# Patient Record
Sex: Female | Born: 1965 | Hispanic: No | Marital: Single | Smoking: Never smoker
Health system: Southern US, Community
[De-identification: ages and names within clinical notes are randomized; demographics above are authoritative.]

## PROBLEM LIST (undated history)

## (undated) DIAGNOSIS — I5032 Chronic diastolic (congestive) heart failure: Secondary | ICD-10-CM

## (undated) DIAGNOSIS — C799 Secondary malignant neoplasm of unspecified site: Secondary | ICD-10-CM

## (undated) DIAGNOSIS — C569 Malignant neoplasm of unspecified ovary: Secondary | ICD-10-CM

## (undated) DIAGNOSIS — I77810 Thoracic aortic ectasia: Secondary | ICD-10-CM

## (undated) DIAGNOSIS — C801 Malignant (primary) neoplasm, unspecified: Secondary | ICD-10-CM

## (undated) DIAGNOSIS — I4891 Unspecified atrial fibrillation: Secondary | ICD-10-CM

## (undated) DIAGNOSIS — E039 Hypothyroidism, unspecified: Secondary | ICD-10-CM

## (undated) DIAGNOSIS — J45909 Unspecified asthma, uncomplicated: Secondary | ICD-10-CM

## (undated) DIAGNOSIS — I4892 Unspecified atrial flutter: Secondary | ICD-10-CM

## (undated) DIAGNOSIS — I10 Essential (primary) hypertension: Secondary | ICD-10-CM

## (undated) HISTORY — PX: BIOPSY THYROID: PRO38

## (undated) HISTORY — PX: LUNG BIOPSY: SHX232

## (undated) HISTORY — DX: Hypothyroidism, unspecified: E03.9

## (undated) HISTORY — PX: PORTA CATH INSERTION: CATH118285

## (undated) HISTORY — DX: Chronic diastolic (congestive) heart failure: I50.32

## (undated) HISTORY — DX: Thoracic aortic ectasia: I77.810

## (undated) HISTORY — DX: Unspecified atrial flutter: I48.92

---
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
## 2018-12-30 DIAGNOSIS — J069 Acute upper respiratory infection, unspecified: Secondary | ICD-10-CM | POA: Diagnosis not present

## 2019-01-31 DIAGNOSIS — Z760 Encounter for issue of repeat prescription: Secondary | ICD-10-CM | POA: Diagnosis not present

## 2019-01-31 DIAGNOSIS — Z23 Encounter for immunization: Secondary | ICD-10-CM | POA: Diagnosis not present

## 2019-01-31 DIAGNOSIS — I10 Essential (primary) hypertension: Secondary | ICD-10-CM | POA: Diagnosis not present

## 2019-01-31 DIAGNOSIS — Z1212 Encounter for screening for malignant neoplasm of rectum: Secondary | ICD-10-CM | POA: Diagnosis not present

## 2019-01-31 DIAGNOSIS — J4530 Mild persistent asthma, uncomplicated: Secondary | ICD-10-CM | POA: Diagnosis not present

## 2019-01-31 DIAGNOSIS — Z1211 Encounter for screening for malignant neoplasm of colon: Secondary | ICD-10-CM | POA: Diagnosis not present

## 2019-03-31 DIAGNOSIS — M47816 Spondylosis without myelopathy or radiculopathy, lumbar region: Secondary | ICD-10-CM | POA: Diagnosis not present

## 2019-03-31 DIAGNOSIS — M4180 Other forms of scoliosis, site unspecified: Secondary | ICD-10-CM | POA: Diagnosis not present

## 2019-03-31 DIAGNOSIS — M545 Low back pain: Secondary | ICD-10-CM | POA: Diagnosis not present

## 2019-04-14 DIAGNOSIS — M545 Low back pain: Secondary | ICD-10-CM | POA: Diagnosis not present

## 2019-04-14 DIAGNOSIS — M47816 Spondylosis without myelopathy or radiculopathy, lumbar region: Secondary | ICD-10-CM | POA: Diagnosis not present

## 2019-04-14 DIAGNOSIS — M4180 Other forms of scoliosis, site unspecified: Secondary | ICD-10-CM | POA: Diagnosis not present

## 2019-04-16 DIAGNOSIS — M6281 Muscle weakness (generalized): Secondary | ICD-10-CM | POA: Diagnosis not present

## 2019-04-16 DIAGNOSIS — M545 Low back pain: Secondary | ICD-10-CM | POA: Diagnosis not present

## 2019-04-18 DIAGNOSIS — M545 Low back pain: Secondary | ICD-10-CM | POA: Diagnosis not present

## 2019-04-18 DIAGNOSIS — M6281 Muscle weakness (generalized): Secondary | ICD-10-CM | POA: Diagnosis not present

## 2019-04-22 DIAGNOSIS — M545 Low back pain: Secondary | ICD-10-CM | POA: Diagnosis not present

## 2019-04-22 DIAGNOSIS — M6281 Muscle weakness (generalized): Secondary | ICD-10-CM | POA: Diagnosis not present

## 2019-04-23 DIAGNOSIS — M545 Low back pain: Secondary | ICD-10-CM | POA: Diagnosis not present

## 2019-04-23 DIAGNOSIS — M6281 Muscle weakness (generalized): Secondary | ICD-10-CM | POA: Diagnosis not present

## 2019-04-25 DIAGNOSIS — M545 Low back pain: Secondary | ICD-10-CM | POA: Diagnosis not present

## 2019-04-25 DIAGNOSIS — M6281 Muscle weakness (generalized): Secondary | ICD-10-CM | POA: Diagnosis not present

## 2019-04-29 DIAGNOSIS — M6281 Muscle weakness (generalized): Secondary | ICD-10-CM | POA: Diagnosis not present

## 2019-04-29 DIAGNOSIS — M545 Low back pain: Secondary | ICD-10-CM | POA: Diagnosis not present

## 2019-05-01 DIAGNOSIS — M545 Low back pain: Secondary | ICD-10-CM | POA: Diagnosis not present

## 2019-05-01 DIAGNOSIS — M6281 Muscle weakness (generalized): Secondary | ICD-10-CM | POA: Diagnosis not present

## 2019-05-02 DIAGNOSIS — M545 Low back pain: Secondary | ICD-10-CM | POA: Diagnosis not present

## 2019-05-02 DIAGNOSIS — M6281 Muscle weakness (generalized): Secondary | ICD-10-CM | POA: Diagnosis not present

## 2019-05-06 DIAGNOSIS — M545 Low back pain: Secondary | ICD-10-CM | POA: Diagnosis not present

## 2019-05-06 DIAGNOSIS — M6281 Muscle weakness (generalized): Secondary | ICD-10-CM | POA: Diagnosis not present

## 2019-05-08 DIAGNOSIS — M6281 Muscle weakness (generalized): Secondary | ICD-10-CM | POA: Diagnosis not present

## 2019-05-08 DIAGNOSIS — M545 Low back pain: Secondary | ICD-10-CM | POA: Diagnosis not present

## 2019-05-13 DIAGNOSIS — M545 Low back pain: Secondary | ICD-10-CM | POA: Diagnosis not present

## 2019-05-13 DIAGNOSIS — M6281 Muscle weakness (generalized): Secondary | ICD-10-CM | POA: Diagnosis not present

## 2019-05-14 DIAGNOSIS — Z1159 Encounter for screening for other viral diseases: Secondary | ICD-10-CM | POA: Diagnosis not present

## 2019-05-23 DIAGNOSIS — M545 Low back pain: Secondary | ICD-10-CM | POA: Diagnosis not present

## 2019-05-23 DIAGNOSIS — M6281 Muscle weakness (generalized): Secondary | ICD-10-CM | POA: Diagnosis not present

## 2019-05-26 DIAGNOSIS — Z1159 Encounter for screening for other viral diseases: Secondary | ICD-10-CM | POA: Diagnosis not present

## 2019-05-26 DIAGNOSIS — R05 Cough: Secondary | ICD-10-CM | POA: Diagnosis not present

## 2019-06-01 ENCOUNTER — Other Ambulatory Visit: Payer: Self-pay

## 2019-06-01 DIAGNOSIS — C414 Malignant neoplasm of pelvic bones, sacrum and coccyx: Secondary | ICD-10-CM | POA: Diagnosis not present

## 2019-06-01 DIAGNOSIS — I10 Essential (primary) hypertension: Secondary | ICD-10-CM | POA: Insufficient documentation

## 2019-06-01 DIAGNOSIS — R1030 Lower abdominal pain, unspecified: Secondary | ICD-10-CM | POA: Diagnosis not present

## 2019-06-01 DIAGNOSIS — D649 Anemia, unspecified: Secondary | ICD-10-CM | POA: Insufficient documentation

## 2019-06-01 DIAGNOSIS — J45909 Unspecified asthma, uncomplicated: Secondary | ICD-10-CM | POA: Diagnosis not present

## 2019-06-01 DIAGNOSIS — Z79899 Other long term (current) drug therapy: Secondary | ICD-10-CM | POA: Diagnosis not present

## 2019-06-01 DIAGNOSIS — C495 Malignant neoplasm of connective and soft tissue of pelvis: Secondary | ICD-10-CM | POA: Diagnosis not present

## 2019-06-01 DIAGNOSIS — R109 Unspecified abdominal pain: Secondary | ICD-10-CM | POA: Diagnosis not present

## 2019-06-02 ENCOUNTER — Encounter (HOSPITAL_COMMUNITY): Payer: Self-pay

## 2019-06-02 ENCOUNTER — Emergency Department (HOSPITAL_COMMUNITY): Payer: BC Managed Care – PPO

## 2019-06-02 ENCOUNTER — Emergency Department (HOSPITAL_COMMUNITY)
Admission: EM | Admit: 2019-06-02 | Discharge: 2019-06-02 | Disposition: A | Discharge status: Home / Self Care | Payer: BC Managed Care – PPO | Attending: Emergency Medicine | Admitting: Emergency Medicine

## 2019-06-02 DIAGNOSIS — R1030 Lower abdominal pain, unspecified: Secondary | ICD-10-CM

## 2019-06-02 DIAGNOSIS — C763 Malignant neoplasm of pelvis: Secondary | ICD-10-CM

## 2019-06-02 DIAGNOSIS — R109 Unspecified abdominal pain: Secondary | ICD-10-CM | POA: Diagnosis not present

## 2019-06-02 DIAGNOSIS — D649 Anemia, unspecified: Secondary | ICD-10-CM

## 2019-06-02 HISTORY — DX: Unspecified asthma, uncomplicated: J45.909

## 2019-06-02 HISTORY — DX: Essential (primary) hypertension: I10

## 2019-06-02 LAB — CBC WITH DIFFERENTIAL/PLATELET
Abs Immature Granulocytes: 0.04 10*3/uL (ref 0.00–0.07)
Basophils Absolute: 0.1 10*3/uL (ref 0.0–0.1)
Basophils Relative: 1 %
Eosinophils Absolute: 0.2 10*3/uL (ref 0.0–0.5)
Eosinophils Relative: 1 %
HCT: 29.6 % — ABNORMAL LOW (ref 36.0–46.0)
Hemoglobin: 8.4 g/dL — ABNORMAL LOW (ref 12.0–15.0)
Immature Granulocytes: 0 %
Lymphocytes Relative: 20 %
Lymphs Abs: 2.5 10*3/uL (ref 0.7–4.0)
MCH: 21 pg — ABNORMAL LOW (ref 26.0–34.0)
MCHC: 28.4 g/dL — ABNORMAL LOW (ref 30.0–36.0)
MCV: 74 fL — ABNORMAL LOW (ref 80.0–100.0)
Monocytes Absolute: 1.5 10*3/uL — ABNORMAL HIGH (ref 0.1–1.0)
Monocytes Relative: 12 %
Neutro Abs: 8.2 10*3/uL — ABNORMAL HIGH (ref 1.7–7.7)
Neutrophils Relative %: 66 %
Platelets: 420 10*3/uL — ABNORMAL HIGH (ref 150–400)
RBC: 4 MIL/uL (ref 3.87–5.11)
RDW: 14.8 % (ref 11.5–15.5)
WBC: 12.4 10*3/uL — ABNORMAL HIGH (ref 4.0–10.5)
nRBC: 0 % (ref 0.0–0.2)

## 2019-06-02 LAB — URINALYSIS, ROUTINE W REFLEX MICROSCOPIC
Bacteria, UA: NONE SEEN
Bilirubin Urine: NEGATIVE
Glucose, UA: NEGATIVE mg/dL
Ketones, ur: NEGATIVE mg/dL
Leukocytes,Ua: NEGATIVE
Nitrite: NEGATIVE
Protein, ur: NEGATIVE mg/dL
Specific Gravity, Urine: 1.011 (ref 1.005–1.030)
pH: 5 (ref 5.0–8.0)

## 2019-06-02 LAB — COMPREHENSIVE METABOLIC PANEL
ALT: 13 U/L (ref 0–44)
AST: 19 U/L (ref 15–41)
Albumin: 4.2 g/dL (ref 3.5–5.0)
Alkaline Phosphatase: 40 U/L (ref 38–126)
Anion gap: 9 (ref 5–15)
BUN: 11 mg/dL (ref 6–20)
CO2: 24 mmol/L (ref 22–32)
Calcium: 9.2 mg/dL (ref 8.9–10.3)
Chloride: 103 mmol/L (ref 98–111)
Creatinine, Ser: 0.92 mg/dL (ref 0.44–1.00)
GFR calc Af Amer: >60 mL/min (ref >60)
GFR calc non Af Amer: >60 mL/min (ref >60)
Glucose, Bld: 99 mg/dL (ref 70–99)
Potassium: 4.1 mmol/L (ref 3.5–5.1)
Sodium: 136 mmol/L (ref 135–145)
Total Bilirubin: 0.6 mg/dL (ref 0.3–1.2)
Total Protein: 7.4 g/dL (ref 6.5–8.1)

## 2019-06-02 LAB — LIPASE, BLOOD: Lipase: 30 U/L (ref 11–51)

## 2019-06-02 LAB — PREGNANCY, URINE: Preg Test, Ur: NEGATIVE

## 2019-06-02 MED ORDER — IOHEXOL 300 MG/ML  SOLN
100.0000 mL | Freq: Once | INTRAMUSCULAR | Status: AC | PRN
  Administered 2019-06-02: 100 mL via INTRAVENOUS

## 2019-06-02 MED ORDER — SODIUM CHLORIDE 0.9 % IV BOLUS
1000.0000 mL | Freq: Once | INTRAVENOUS | Status: AC
  Administered 2019-06-02: 1000 mL via INTRAVENOUS

## 2019-06-02 MED ORDER — SODIUM CHLORIDE (PF) 0.9 % IJ SOLN
INTRAMUSCULAR | Status: AC
  Filled 2019-06-02: qty 50

## 2019-06-02 MED ORDER — ONDANSETRON 4 MG PO TBDP: 4.0000 mg | ORAL_TABLET | Freq: Three times a day (TID) | ORAL | 0 refills | Status: DC | PRN

## 2019-06-02 NOTE — ED Notes (Addendum)
Pt verbalized discharge instructions and follow up care. Alert and ambulatory. No iv. Pt given disc from CT

## 2019-06-02 NOTE — Discharge Instructions (Signed)
Your work-up today was notable for unfortunate findings of suspected malignancy of your cervix.  There is also swelling of your pelvic lymph nodes and pulmonary nodules in your lower lungs.  You require close follow-up with gynecology at your scheduled appointment.  We anticipate follow-up with oncology as well, but would advise you speak with your gynecologist about the most appropriate oncologic specialist for your diagnosis.  We recommend Tylenol for management of pain.  You have been prescribed Zofran for management of nausea.  You were found to be anemic.  This is likely due to your ongoing vaginal bleeding, but could also be due to cancer.  This should be rechecked by a primary care doctor or your gynecologist in approximately 1 week.  If you have worsening pain, inability to tolerate food or fluids, fever, increased vaginal bleeding, lightheadedness, loss of consciousness, or other concerning symptoms, we recommend prompt return to the emergency department.

## 2019-06-02 NOTE — ED Provider Notes (Signed)
[facility] DEPT Provider Note   CSN: [id number] Arrival date & time: 06/01/19  2350    History   Chief Complaint Chief Complaint  Patient presents with  . Abdominal Pain    HPI Debra Ware is a 53 y.o. female.     53 year old female with a history of hypertension and asthma presents to the emergency department for evaluation.  Her primary complaint is abdominal pain across her lower abdomen x 5 days.  This is burning and waxing and waning in severity, worse between 2000 and 0100 in the evening.  Pain is aggravated with oral intake.  She has vomiting whenever she tries to eat or drink when pain is present.  States that her stool has been "ribbonlike or little pellets" since her pain began.  She has not noticed any melena or hematochezia, hematemesis, urinary symptoms, fever.  The patient has no history of abdominal surgeries.  She has never had a colonoscopy.  As an aside, patient states that she had sudden onset of flank pain 3 weeks ago.  This was followed by vaginal bleeding.  Vaginal bleeding was heavy, requiring a full package of pads over the course of 5 hours.  She had to change her clothes 4 times due to excess bleeding.  Patient reports normal menses up until this point.  States that her vaginal bleeding has not subsided since this episode 3 weeks ago.  She has only required the use of 1 maxi pad today.  Has never used any tampons for bleeding control.  Denies passing any clots today.  Has some dyspnea on exertion, but does not feel this is necessarily worse and attributes most of this to her history of asthma.  She has not had any syncope, but does note some dizziness with position change.  The patient is not on chronic anticoagulation.  The history is provided by the patient. No language interpreter was used.  Abdominal Pain Associated symptoms: nausea, vaginal bleeding and vomiting   Associated symptoms: no fever     Past Medical History:   Diagnosis Date  . Asthma   . Hypertension     There are no active problems to display for this patient.   ** The histories are not reviewed yet. Please review them in the "History" navigator section and refresh this [place].   OB History   No obstetric history on file.      Home Medications    Prior to Admission medications   Medication Sig Start Date End Date Taking? Authorizing Provider  budesonide-formoterol (SYMBICORT) 160-4.5 MCG/ACT inhaler Inhale 2 puffs into the lungs 2 (two) times daily. 05/26/19  Yes [provider]  olmesartan-hydrochlorothiazide (BENICAR HCT) 20-12.5 MG tablet Take 1 tablet by mouth daily.   Yes [provider]  ondansetron (ZOFRAN ODT) 4 MG disintegrating tablet Take 1 tablet (4 mg total) by mouth every 8 (eight) hours as needed for nausea or vomiting. 06/02/19   Antonietta Breach, PA-C    Family History No family history on file.  Social History Social History   Tobacco Use  . Smoking status: Not on file  Substance Use Topics  . Alcohol use: Not on file  . Drug use: Not on file     Allergies   Azithromycin, Penicillins, and Sulfa antibiotics   Review of Systems Review of Systems  Constitutional: Negative for fever.  Gastrointestinal: Positive for abdominal pain, nausea and vomiting.  Genitourinary: Positive for vaginal bleeding.  Neurological: Positive for light-headedness.  Ten systems  reviewed and are negative for acute change, except as noted in the HPI.    Physical Exam Updated Vital Signs BP 134/81   Pulse 83   Temp 99.2 F (37.3 C) (Oral)   Resp 17   Ht 5\' 11"  (1.803 m)   Wt 126.1 kg   LMP 06/02/2019   SpO2 99%   BMI 38.77 kg/m   Physical Exam Vitals signs and nursing note reviewed.  Constitutional:      General: She is not in acute distress.    Appearance: She is well-developed. She is not diaphoretic.     Comments: Nontoxic appearing. Obese. Pleasant.  HENT:     Head: Normocephalic and  atraumatic.  Eyes:     General: No scleral icterus.    Conjunctiva/sclera: Conjunctivae normal.  Neck:     Musculoskeletal: Normal range of motion.  Cardiovascular:     Rate and Rhythm: Normal rate and regular rhythm.     Pulses: Normal pulses.  Pulmonary:     Effort: Pulmonary effort is normal. No respiratory distress.  Abdominal:     Palpations: Abdomen is soft.     Tenderness: There is abdominal tenderness. There is guarding (voluntary, LLQ).     Comments: Soft, obese abdomen with focal tenderness in the left lower quadrant.  Minimal tenderness in the suprapubic abdomen.  Exam is slightly limited secondary to body habitus.  No peritoneal signs noted.  Genitourinary:    Comments: Deferred due to patient preference. Musculoskeletal: Normal range of motion.  Skin:    General: Skin is warm and dry.     Coloration: Skin is not pale.     Findings: No erythema or rash.  Neurological:     General: No focal deficit present.     Mental Status: She is alert and oriented to person, place, and time.     Coordination: Coordination normal.     Comments: Ambulatory with steady gait  Psychiatric:        Behavior: Behavior normal.      ED Treatments / Results  Labs (all labs ordered are listed, but only abnormal results are displayed) Labs Reviewed  CBC WITH DIFFERENTIAL/PLATELET - Abnormal; Notable for the following components:      Result Value   WBC 12.4 (*)    Hemoglobin 8.4 (*)    HCT 29.6 (*)    MCV 74.0 (*)    MCH 21.0 (*)    MCHC 28.4 (*)    Platelets 420 (*)    Neutro Abs 8.2 (*)    Monocytes Absolute 1.5 (*)    All other components within normal limits  URINALYSIS, ROUTINE W REFLEX MICROSCOPIC - Abnormal; Notable for the following components:   Hgb urine dipstick MODERATE (*)    All other components within normal limits  COMPREHENSIVE METABOLIC PANEL  LIPASE, BLOOD  PREGNANCY, URINE  SAMPLE TO BLOOD BANK    EKG None  Radiology Ct Abdomen Pelvis W Contrast   Result Date: 06/02/2019 CLINICAL DATA:  Abdominal pain, diverticulitis suspected EXAM: CT ABDOMEN AND PELVIS WITH CONTRAST TECHNIQUE: Multidetector CT imaging of the abdomen and pelvis was performed using the standard protocol following bolus administration of intravenous contrast. CONTRAST:  175mL OMNIPAQUE IOHEXOL 300 MG/ML  SOLN COMPARISON:  None. FINDINGS: Lower chest: Innumerable noncalcified pulmonary nodules in the lower lungs, all measuring less than 1 cm. Hepatobiliary: No focal liver abnormality.No evidence of biliary obstruction or stone. Pancreas: Unremarkable. Spleen: Unremarkable. Adrenals/Urinary Tract: Negative adrenals. No hydronephrosis or stone. Unremarkable bladder. Stomach/Bowel:  No obstruction. No appendicitis. Vascular/Lymphatic: No acute vascular abnormality. Enlarged pelvic and lower abdominal retroperitoneal lymph nodes. A rounded left pelvic sidewall node measures 2.9 cm in diameter. Reproductive:Indistinct low-density appearance of the lower uterine segment and cervix. Negative ovaries Other: Trace pelvic ascites considered reactive Musculoskeletal: Spondylosis degenerative disc disease and scoliosis. IMPRESSION: Malignant findings with anindistinct mass at the lower uterine segment/cervix, pelvic adenopathy and innumerable pulmonary nodules in the lower lungs. Recommend oncology/gynecology referral. Electronically Signed   By: Monte Fantasia M.D.   On: 06/02/2019 04:11    Procedures Procedures (including critical care time)  Medications Ordered in ED Medications  sodium chloride (PF) 0.9 % injection (has no administration in time range)  sodium chloride 0.9 % bolus 1,000 mL (0 mLs Intravenous Stopped 06/02/19 0353)  iohexol (OMNIPAQUE) 300 MG/ML solution 100 mL (100 mLs Intravenous Contrast Given 06/02/19 0341)    2:48 AM Stable orthostatic vital signs.  5:00 AM Patient updated on imaging results.  Stated to answer any questions as well as to ease patient concern.  I  reached out to Dr. Pamala Hurry of [facility] as patient has an appointment on 06/05/2019 to establish herself as a patient with their practice.  Dr. Valentino Saxon has made note of the patient's information and will inform her colleague of today's imaging findings for further direction of care.  5:22 AM Plan for outpatient f/u discussed with the patient who verbalizes comfort and understanding with plan. I did discuss completion of pelvic exam while in the ED for further visualization of the cervix and to assess bleeding. Patient declines pelvic at this time stating she will have this completed at her appointment in 3 days.   Initial Impression / Assessment and Plan / ED Course  I have reviewed the triage vital signs and the nursing notes.  Pertinent labs & imaging results that were available during my care of the patient were reviewed by me and considered in my medical decision making (see chart for details).        53 year old female presents to the emergency department for complaints of abdominal pain x 5 days.  Patient reports stooling changes as well as worsening pain with eating, also causing subsequent emesis.  As an aside, she noted 3 weeks of vaginal bleeding.  This was heavy at first, but has slowed and persisted.  Patient hemodynamically stable on arrival.  She declined pain medication, but was hydrated with 1 L IV fluids.  Work-up today notable for leukocytosis as well as anemia.  Patient without any prior labs for comparison.  Her anemia is microcytic suggesting chronic process.  Urinalysis without evidence of UTI.  Negative pregnancy test.  Patient underwent CT scan for evaluation of pain.  Initially, there was suspicion for diverticulitis given ribbonlike and pebbled stools.  Unfortunately, the patient's CT scan notable for mass in the lower uterine segment/cervix.  This is associated with pelvic adenopathy as well as innumerable bilateral lower pulmonary nodules.  Concern is for  malignancy which can also explain patient's anemia.  Patient with OB/GYN follow-up scheduled for 06/05/2019.  I spoke with Dr. Pamala Hurry of [facility] as this is the group the patient is planning to follow with.  No present indication for admission as patient is hemodynamically stable with otherwise reassuring work-up.  Despite her anemia, she does not meet criteria for emergent transfusion.  I have advised the use of Tylenol for pain.  She was given a prescription for Zofran to use for nausea management.  Return precautions discussed and  provided.  Patient discharged in stable condition with no unaddressed concerns.   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

## 2019-06-02 NOTE — ED Triage Notes (Addendum)
Pt arrived with complaints of abdominal pain that started 5 days ago. Pt reports emesis and some constipation. Pt also states she passed blood clots vaginally 3 days ago that she is concerned about.

## 2019-06-05 ENCOUNTER — Encounter: Payer: Self-pay | Admitting: Gynecologic Oncology

## 2019-06-05 DIAGNOSIS — Z1151 Encounter for screening for human papillomavirus (HPV): Secondary | ICD-10-CM | POA: Diagnosis not present

## 2019-06-05 DIAGNOSIS — Z01419 Encounter for gynecological examination (general) (routine) without abnormal findings: Secondary | ICD-10-CM | POA: Diagnosis not present

## 2019-06-05 DIAGNOSIS — Z124 Encounter for screening for malignant neoplasm of cervix: Secondary | ICD-10-CM | POA: Diagnosis not present

## 2019-06-05 DIAGNOSIS — R1907 Generalized intra-abdominal and pelvic swelling, mass and lump: Secondary | ICD-10-CM | POA: Diagnosis not present

## 2019-06-05 DIAGNOSIS — Z1231 Encounter for screening mammogram for malignant neoplasm of breast: Secondary | ICD-10-CM | POA: Diagnosis not present

## 2019-06-05 DIAGNOSIS — C52 Malignant neoplasm of vagina: Secondary | ICD-10-CM | POA: Diagnosis not present

## 2019-06-05 DIAGNOSIS — N939 Abnormal uterine and vaginal bleeding, unspecified: Secondary | ICD-10-CM | POA: Diagnosis not present

## 2019-06-09 DIAGNOSIS — R935 Abnormal findings on diagnostic imaging of other abdominal regions, including retroperitoneum: Secondary | ICD-10-CM | POA: Diagnosis not present

## 2019-06-16 ENCOUNTER — Encounter: Payer: Self-pay | Admitting: Oncology

## 2019-06-16 ENCOUNTER — Other Ambulatory Visit: Payer: Self-pay

## 2019-06-16 ENCOUNTER — Telehealth: Payer: Self-pay | Admitting: Oncology

## 2019-06-16 ENCOUNTER — Inpatient Hospital Stay: Payer: BC Managed Care – PPO | Attending: Gynecologic Oncology | Admitting: Gynecologic Oncology

## 2019-06-16 VITALS — BP 147/78 | HR 74 | Temp 98.3°F | Resp 22 | Ht 69.0 in | Wt 274.0 lb

## 2019-06-16 DIAGNOSIS — C7801 Secondary malignant neoplasm of right lung: Secondary | ICD-10-CM

## 2019-06-16 DIAGNOSIS — C7982 Secondary malignant neoplasm of genital organs: Secondary | ICD-10-CM | POA: Diagnosis not present

## 2019-06-16 DIAGNOSIS — C530 Malignant neoplasm of endocervix: Secondary | ICD-10-CM

## 2019-06-16 DIAGNOSIS — C772 Secondary and unspecified malignant neoplasm of intra-abdominal lymph nodes: Secondary | ICD-10-CM

## 2019-06-16 DIAGNOSIS — C775 Secondary and unspecified malignant neoplasm of intrapelvic lymph nodes: Secondary | ICD-10-CM | POA: Insufficient documentation

## 2019-06-16 DIAGNOSIS — I10 Essential (primary) hypertension: Secondary | ICD-10-CM | POA: Diagnosis not present

## 2019-06-16 DIAGNOSIS — Z6841 Body Mass Index (BMI) 40.0 and over, adult: Secondary | ICD-10-CM | POA: Diagnosis not present

## 2019-06-16 DIAGNOSIS — R05 Cough: Secondary | ICD-10-CM | POA: Insufficient documentation

## 2019-06-16 DIAGNOSIS — C778 Secondary and unspecified malignant neoplasm of lymph nodes of multiple regions: Secondary | ICD-10-CM | POA: Diagnosis not present

## 2019-06-16 DIAGNOSIS — M25559 Pain in unspecified hip: Secondary | ICD-10-CM | POA: Insufficient documentation

## 2019-06-16 DIAGNOSIS — C801 Malignant (primary) neoplasm, unspecified: Secondary | ICD-10-CM

## 2019-06-16 DIAGNOSIS — G893 Neoplasm related pain (acute) (chronic): Secondary | ICD-10-CM | POA: Diagnosis not present

## 2019-06-16 DIAGNOSIS — E119 Type 2 diabetes mellitus without complications: Secondary | ICD-10-CM | POA: Diagnosis not present

## 2019-06-16 DIAGNOSIS — C7802 Secondary malignant neoplasm of left lung: Secondary | ICD-10-CM

## 2019-06-16 DIAGNOSIS — R059 Cough, unspecified: Secondary | ICD-10-CM | POA: Insufficient documentation

## 2019-06-16 HISTORY — DX: Morbid (severe) obesity due to excess calories: E66.01

## 2019-06-16 HISTORY — DX: Secondary and unspecified malignant neoplasm of intrapelvic lymph nodes: C77.5

## 2019-06-16 HISTORY — DX: Secondary malignant neoplasm of left lung: C78.01

## 2019-06-16 HISTORY — DX: Malignant neoplasm of endocervix: C53.0

## 2019-06-16 HISTORY — DX: Secondary and unspecified malignant neoplasm of intra-abdominal lymph nodes: C77.2

## 2019-06-16 MED ORDER — OXYCODONE HCL 5 MG PO TABS: 5.0000 mg | ORAL_TABLET | ORAL | 0 refills | Status: DC | PRN

## 2019-06-16 MED ORDER — SENNA 8.6 MG PO TABS: 1.0000 | ORAL_TABLET | Freq: Every day | ORAL | 0 refills | Status: DC

## 2019-06-16 NOTE — Progress Notes (Signed)
Met with patient after her appointment with Dr. Denman George.  She asked for assistance finding housing and financial assistance resources.  Advised her that I will contact Lauren with Social Work and the Mining engineer to help with these issues.  She has also been given the [employer] and will call with any questions or concerns.

## 2019-06-16 NOTE — Telephone Encounter (Signed)
Called [facility] and spoke with Crystal.  Requested PD L1 on biopsy from 06/05/19.  She will fax results when available to [phone].

## 2019-06-16 NOTE — Patient Instructions (Signed)
The biopsy is suggestive of a cancer that originated in the endocervix (which is the internal canal of the cervix). It has spread to the lymph nodes in the pelvis and abdomen and is present in both lungs. This means that it is a stage 4 cancer. It requires chemotherapy to treat the disease which has spread. Dr Denman George is recommending that you meet with medical oncology, Dr Alvy Bimler, for discussion about treatment with 3 chemotherapy drugs (Cisplatin, Paclitaxel, Bevacizumab). She is also recommending that you meet with the radiation doctor, Dr Sondra Come, to discuss treatment to the cervix, uterus and pelvic lymph nodes to control disease and pain and bleeding at those sites.  Surgery is typically not indicated for woman with stage 4 cervical cancer.  Dr Denman George will present your case at the multidisciplinary tumor board conference and request that pathology perform assessment for the marker PDL1 which indicates if your cancer may be responsive to immune therapy.   Dr Denman George will order a chest CT scan to better characterize the full extent of disease in the chest compartment.   Dr Denman George has prescribed oxycodone 32m to take by mouth every 4 hours as needed for severe pain. This can be taken with tylenol or ibuprofen as it does not react with these medications. You may choose to stagger taking tylenol with oxycodone.  Oxycodone is addictive if taken for prolonged periods but is typically not addictive for patients with active cancer pain. It can cause constipation, therefore please take senakot every night while you are taking the oxycodone. If you develop diarrhea please stop the Senakot.   Dr RSerita Gritoffice can be reached at [phone]. Her office can assist in providing names for second opinions at [facility] [employer]

## 2019-06-16 NOTE — Progress Notes (Signed)
Consult Note: Gyn-Onc  Consult was requested by Dr. Murrell Redden for the evaluation of Debra Ware 53 y.o. female  CC:  Chief Complaint  Patient presents with  . Carcinoma [facility])    Assessment/Plan:  Debra Ware  is a 53 y.o.  year old with stage IV endocervical cancer.   Together the patient and I reviewed the CT scan and its images that was performed on June 02, 2019.  I demonstrated to the patient the presence of extensive disease in the pelvis including the uterus, cervix, parametria, and bulky retroperitoneal lymphadenopathy in the pelvis and para-aortic regions.  We discussed and reviewed the presence of numerous bilateral pulmonary nodules consistent with metastatic disease.  I discussed with Debra Ware that this is suggestive of stage IV disease.  Reviewing the pathology report the immunostains seem most consistent with an endocervical primary likely of squamous origin, however it is a poorly differentiated neoplasm.  Certainly the patient has some clinical history that is suggestive of an endometrial primary (morbid obesity, longstanding history of irregular menses and anovulatory cycles).  Additionally she has no personal history of abnormal Pap cytology or HPV disease.  I recommend that we review her pathology with our multidisciplinary tumor board conference to ensure that we concur she has an endocervical primary as best as it is possible from this poorly differentiated tumor.  Presuming a stage IV endocervical cancer, I am recommending treatment with triple therapy chemotherapy (cisplatin, paclitaxel, bevacizumab).  I also recommend radiation to the pelvis to control bleeding and pelvic pain symptoms from the bulky pelvic disease.  We will need to obtain a CT scan of the chest to better characterize the extent of disease in the chest compartment.  We will request PDL 1 staining to be performed on the biopsy.  We have facilitated consultations with medical and radiation  oncology and a plan discussion at multidisciplinary tumor board conference.  I prescribed her oxycodone 5 mg every 4 hours as needed and Senokot to take to help control pain symptoms until we have better control with therapy.  I counseled her regarding multimodal pain therapy to minimize opioid use and risks of long-term opioid use.  The patient has a planned second opinion scheduled for [facility] [place] tomorrow.  This will put a delay in our being able to coordinate care with our providers here at the cancer center and to establish timely interventions.  However we will try to facilitate this as quickly as possible when she return should she still desires to receive her treatment here in [place].  If she desires a third opinion we will help facilitate this with [employer] and/or [facility].   HPI: Debra Ware is a 53 year old P0 who is seen in consultation at the request of Dr Murrell Redden for evaluation of stage IV endocervical cancer.   The patient reports a history of vaginal hemorrhage in late May early June.  This was associated with persistent low-grade fevers, fatigue, and dizziness.  She underwent COVID testing which was negative.  She began experiencing intermittent abdominal pains and difficulty digesting food (she denies nausea or emesis).  Her abdominal discomfort was resolved with passage of a bowel movement.  She needed to premedicate with Tylenol prior to eating to help with this abdominal pain.  This persistent abdominal pain symptom after eating prompted a CT scan of the abdomen and pelvis to be performed at the [facility] emergency department on June 02, 2019.  This revealed  innumerable noncalcified pulmonary nodules in bilateral lower lungs or less than 1 cm, consistent with metastatic disease.  There was no liver abnormalities or splenic abnormality seen.  The pancreas was normal.  There was no hydronephrosis.  Enlarged pelvic and lower  abdominal retroperitoneal lymph nodes were seen including a rounded left pelvic sidewall lymph node measuring 2.9 cm.  The uterus contained indistinct low-density appearance in the lower uterine segment and cervix.  There was trace pelvic ascites that was considered reactive.  She was then subsequently seen by [facility] on May 06, 2019 when the cervix was noted to be grossly normal on the ectocervix however there was barreling of the endocervix palpable and a distended lower uterine segment.  An endocervical biopsy was taken at that visit.  It was resulted on July 6 verbally and on July 7 formally as poorly differentiated neoplasm.  High-grade carcinoma with squamous features.  The immunohistochemistry demonstrated CK 5 6 positivity, P 16+ patchy.  P63 weak patchy positive.  P53 wild-type staining.  CK7 and ER negative.  HPV testing is not resulted at this time but was due to be added.  A Pap test that had been performed the same day on June 05, 2019 was negative for intraepithelial lesion or malignancy and negative for high risk HPV sub-testing.  This immunostain patten was consistent with a endocervical squamous cell carcinoma, poorly differentiated.  Referral was made to GYN oncology on June 13, 2019.  The patient's medical history is significant for morbid obesity.  She has a history of being 360 pounds, but lost approximately 100 pounds in 2018.  Her BMI at the time of cancer diagnosis was 40 kg per metered squared.  She has a history of diabetes and hypertension that resolved after 100 pounds of weight loss.  She has a history of anemia and asthma.  She has never had surgery of any type.  She is a non-smoker and does not regularly drink alcohol.  She has a history of regular Paps none of which had been abnormal in her past.  She is never been pregnant.  She lives alone.  Her closest family live in [place].  She works at [employer]  Patient's family history significant for a father  with throat cancer (he was a smoker), and a paternal uncle with a cancer on his spinal column.  Her mother's family includes a maternal aunt and a great aunt with uterine cancer.  Current Meds:  Outpatient Encounter Medications as of 06/16/2019  Medication Sig  . budesonide-formoterol (SYMBICORT) 160-4.5 MCG/ACT inhaler Inhale 2 puffs into the lungs 2 (two) times daily.  . IBUPROFEN PO Take 650 mg by mouth 2 (two) times daily before a meal.  . olmesartan-hydrochlorothiazide (BENICAR HCT) 20-12.5 MG tablet Take 1 tablet by mouth daily.  . ondansetron (ZOFRAN ODT) 4 MG disintegrating tablet Take 1 tablet (4 mg total) by mouth every 8 (eight) hours as needed for nausea or vomiting. (Patient not taking: Reported on 06/16/2019)  . oxyCODONE (OXY IR/ROXICODONE) 5 MG immediate release tablet Take 1 tablet (5 mg total) by mouth every 4 (four) hours as needed for severe pain.  Marland Kitchen senna (SENOKOT) 8.6 MG TABS tablet Take 1 tablet (8.6 mg total) by mouth at bedtime.   No facility-administered encounter medications on file as of 06/16/2019.     Allergy:  Allergies  Allergen Reactions  . Azithromycin Hives  . Penicillins Rash    Did it involve swelling of the face/tongue/throat, SOB, or low BP? Yes  Did it involve sudden or severe rash/hives, skin peeling, or any reaction on the inside of your mouth or nose? No Did you need to seek medical attention at a hospital or doctor's office? No When did it last happen?unknown  If all above answers are "NO", may proceed with cephalosporin use.;  . Sulfa Antibiotics Rash    Social Hx:   Social History   Socioeconomic History  . Marital status: Single    Spouse name: Not on file  . Number of children: Not on file  . Years of education: Not on file  . Highest education level: Not on file  Occupational History  . Not on file  Social Needs  . Financial resource strain: Not on file  . Food insecurity    Worry: Not on file    Inability: Not on file  .  Transportation needs    Medical: Not on file    Non-medical: Not on file  Tobacco Use  . Smoking status: Never Smoker  . Smokeless tobacco: Never Used  Substance and Sexual Activity  . Alcohol use: Yes    Comment: occasionally  . Drug use: Never  . Sexual activity: Not Currently  Lifestyle  . Physical activity    Days per week: Not on file    Minutes per session: Not on file  . Stress: Not on file  Relationships  . Social Herbalist on phone: Not on file    Gets together: Not on file    Attends religious service: Not on file    Active member of club or organization: Not on file    Attends meetings of clubs or organizations: Not on file    Relationship status: Not on file  . Intimate partner violence    Fear of current or ex partner: Not on file    Emotionally abused: Not on file    Physically abused: Not on file    Forced sexual activity: Not on file  Other Topics Concern  . Not on file  Social History Narrative  . Not on file    Past Surgical Hx: History reviewed. No pertinent surgical history.  Past Medical Hx:  Past Medical History:  Diagnosis Date  . Asthma   . Hypertension     Past Gynecological History:  P0, no history of abnormal paps (last pap July, 2020)  Patient's last menstrual period was 06/02/2019.  Family Hx:  Family History  Problem Relation Age of Onset  . Heart attack Father   . Lung cancer Father   . Throat cancer Father   . Heart disease Father   . Diabetes Paternal Grandfather   . Heart disease Paternal Grandfather   . Hypertension Paternal Grandfather   . Breast cancer Neg Hx   . Ovarian cancer Neg Hx   . Colon cancer Neg Hx     Review of Systems:  Constitutional  Feels fatigued  ENT Normal appearing ears and nares bilaterally Skin/Breast  No rash, sores, jaundice, itching, dryness Cardiovascular  No chest pain, shortness of breath, or edema  Pulmonary  + dry cough Gastro Intestinal  No nausea, vomitting, or  diarrhoea. No bright red blood per rectum, no abdominal pain, change in bowel movement, or constipation.  Genito Urinary  No frequency, urgency, dysuria, + menorrhagia, + irregular cycles, + pelvic pain Musculo Skeletal  No myalgia, arthralgia, joint swelling or pain  Neurologic  No weakness, numbness, change in gait,  Psychology  No depression, anxiety, insomnia.  Vitals:  Blood pressure (!) 147/78, pulse 74, temperature 98.3 F (36.8 C), temperature source Oral, resp. rate (!) 22, height '5\' 9"'  (1.753 m), weight 274 lb (124.3 kg), last menstrual period 06/02/2019, SpO2 100 %.  Physical Exam: WD in NAD Neck  Supple NROM, without any enlargements.  Lymph Node Survey No cervical supraclavicular or inguinal adenopathy Cardiovascular  Pulse normal rate, regularity and rhythm. S1 and S2 normal.  Lungs  Clear to auscultation bilateraly, without wheezes/crackles/rhonchi. Good air movement.  Skin  No rash/lesions/breakdown  Psychiatry  Alert and oriented to person, place, and time  Abdomen  Normoactive bowel sounds, abdomen soft, non-tender and obese without evidence of hernia.  Back No CVA tenderness Genito Urinary  Vulva/vagina: Normal external female genitalia.   No lesions. No discharge or bleeding.  Bladder/urethra:  No lesions or masses, well supported bladder  Vagina: nodule appreciated at introitus anteriorally adjacent to urethral meatus  Cervix: Normal appearing, tilted posteriorally, profuse discharge from the cervical os.   Uterus: Bulky lower uterine segment and distended endocervical canal bilaterally with parametrial infiltration (not to sidewall). Mobile.  Adnexa: no discrete masses but exam limited by body habitus and bulky endocervical lesion Rectal  Good tone, no masses no cul de sac nodularity. Bilateral parametrial extension/barreling of cervix. Extremities  No bilateral cyanosis, clubbing or edema.   Thereasa Solo, MD  06/16/2019, 1:45 PM

## 2019-06-17 ENCOUNTER — Telehealth: Payer: Self-pay | Admitting: *Deleted

## 2019-06-17 NOTE — Telephone Encounter (Signed)
[place] Work  Clinical Social Work was referred by [employer] navigator for assessment of psychosocial needs.  Clinical Social Worker contacted patient by phone  to offer support and assess for needs.  Ms. Slingerland shared she was recently diagnosed with cancer. She currently lives with a roommate, but can no longer afford to pay her rent since she's stopped working.  Patient has no family in the area and just recently moved to [place] in October.  Ms. Giambra qualifies for short-term disability through her employer and plans to apply very soon.  CSW and patient discussed resources and limitations regarding housing in her budget.  CSW referred patient to [employer] through [place] and made referral to [employer] for disability assistance.      Gwinda Maine, LCSW  Clinical Social Worker [facility]

## 2019-06-19 ENCOUNTER — Ambulatory Visit (HOSPITAL_COMMUNITY): Payer: BC Managed Care – PPO

## 2019-06-19 ENCOUNTER — Telehealth: Payer: Self-pay | Admitting: Oncology

## 2019-06-19 NOTE — Telephone Encounter (Signed)
Called Debra Ware to see if she wants to keep her appointment tomorrow.  She said she just got done with appointments in [place] and has not been able to think about it yet.  Advised her to call in the morning if she decides to cancel her appointment.

## 2019-06-20 ENCOUNTER — Ambulatory Visit (HOSPITAL_COMMUNITY): Admission: RE | Admit: 2019-06-20 | Payer: BC Managed Care – PPO | Source: Ambulatory Visit

## 2019-06-20 ENCOUNTER — Ambulatory Visit: Payer: BC Managed Care – PPO | Admitting: Hematology and Oncology

## 2019-06-20 ENCOUNTER — Telehealth: Payer: Self-pay | Admitting: Oncology

## 2019-06-20 ENCOUNTER — Telehealth: Payer: Self-pay | Admitting: *Deleted

## 2019-06-20 NOTE — Telephone Encounter (Signed)
Debra Ware called and canceled her appointments with Dr. Alvy Bimler and Dr. Sondra Come.  She said she and her family have decided that she should get treatment at [facility] in [place].  Discussed when she would start treatment there and she said they are planning a lung biopsy and port placement and will decide treatment once the lung pathology is back.  She also said they will help with her deductible and housing expenses while she is getting treatment.  Advised her to call back if she needs anything.

## 2019-06-20 NOTE — Telephone Encounter (Signed)
Per [place] notes pt decided have treatment at [facility] [place] in [place]

## 2019-06-23 ENCOUNTER — Ambulatory Visit: Payer: BC Managed Care – PPO

## 2019-06-23 ENCOUNTER — Ambulatory Visit
Admission: RE | Admit: 2019-06-23 | Discharge: 2019-06-23 | Disposition: A | Discharge status: Home / Self Care | Payer: BC Managed Care – PPO | Source: Ambulatory Visit | Attending: Radiation Oncology | Admitting: Radiation Oncology

## 2019-06-27 ENCOUNTER — Emergency Department (HOSPITAL_COMMUNITY): Payer: BC Managed Care – PPO

## 2019-06-27 ENCOUNTER — Encounter (HOSPITAL_COMMUNITY): Payer: Self-pay | Admitting: Emergency Medicine

## 2019-06-27 ENCOUNTER — Other Ambulatory Visit: Payer: Self-pay

## 2019-06-27 ENCOUNTER — Emergency Department (HOSPITAL_COMMUNITY)
Admission: EM | Admit: 2019-06-27 | Discharge: 2019-06-28 | Disposition: A | Discharge status: Home / Self Care | Payer: BC Managed Care – PPO | Attending: Emergency Medicine | Admitting: Emergency Medicine

## 2019-06-27 DIAGNOSIS — C7802 Secondary malignant neoplasm of left lung: Secondary | ICD-10-CM | POA: Insufficient documentation

## 2019-06-27 DIAGNOSIS — C7801 Secondary malignant neoplasm of right lung: Secondary | ICD-10-CM | POA: Diagnosis not present

## 2019-06-27 DIAGNOSIS — C775 Secondary and unspecified malignant neoplasm of intrapelvic lymph nodes: Secondary | ICD-10-CM | POA: Insufficient documentation

## 2019-06-27 DIAGNOSIS — C55 Malignant neoplasm of uterus, part unspecified: Secondary | ICD-10-CM | POA: Diagnosis not present

## 2019-06-27 DIAGNOSIS — I10 Essential (primary) hypertension: Secondary | ICD-10-CM | POA: Diagnosis not present

## 2019-06-27 DIAGNOSIS — C7982 Secondary malignant neoplasm of genital organs: Secondary | ICD-10-CM | POA: Diagnosis not present

## 2019-06-27 DIAGNOSIS — N839 Noninflammatory disorder of ovary, fallopian tube and broad ligament, unspecified: Secondary | ICD-10-CM | POA: Diagnosis not present

## 2019-06-27 DIAGNOSIS — R102 Pelvic and perineal pain: Secondary | ICD-10-CM | POA: Insufficient documentation

## 2019-06-27 DIAGNOSIS — N838 Other noninflammatory disorders of ovary, fallopian tube and broad ligament: Secondary | ICD-10-CM | POA: Insufficient documentation

## 2019-06-27 DIAGNOSIS — C772 Secondary and unspecified malignant neoplasm of intra-abdominal lymph nodes: Secondary | ICD-10-CM | POA: Diagnosis not present

## 2019-06-27 DIAGNOSIS — R1031 Right lower quadrant pain: Secondary | ICD-10-CM | POA: Diagnosis not present

## 2019-06-27 DIAGNOSIS — J45909 Unspecified asthma, uncomplicated: Secondary | ICD-10-CM | POA: Insufficient documentation

## 2019-06-27 DIAGNOSIS — Z79899 Other long term (current) drug therapy: Secondary | ICD-10-CM | POA: Insufficient documentation

## 2019-06-27 DIAGNOSIS — C799 Secondary malignant neoplasm of unspecified site: Secondary | ICD-10-CM

## 2019-06-27 LAB — COMPREHENSIVE METABOLIC PANEL
ALT: 16 U/L (ref 0–44)
AST: 39 U/L (ref 15–41)
Albumin: 4.2 g/dL (ref 3.5–5.0)
Alkaline Phosphatase: 40 U/L (ref 38–126)
Anion gap: 9 (ref 5–15)
BUN: 15 mg/dL (ref 6–20)
CO2: 24 mmol/L (ref 22–32)
Calcium: 9.7 mg/dL (ref 8.9–10.3)
Chloride: 106 mmol/L (ref 98–111)
Creatinine, Ser: 0.81 mg/dL (ref 0.44–1.00)
GFR calc Af Amer: >60 mL/min (ref >60)
GFR calc non Af Amer: >60 mL/min (ref >60)
Glucose, Bld: 104 mg/dL — ABNORMAL HIGH (ref 70–99)
Potassium: 5 mmol/L (ref 3.5–5.1)
Sodium: 139 mmol/L (ref 135–145)
Total Bilirubin: 0.4 mg/dL (ref 0.3–1.2)
Total Protein: 7.4 g/dL (ref 6.5–8.1)

## 2019-06-27 LAB — CBC
HCT: 31.9 % — ABNORMAL LOW (ref 36.0–46.0)
Hemoglobin: 8.7 g/dL — ABNORMAL LOW (ref 12.0–15.0)
MCH: 19.8 pg — ABNORMAL LOW (ref 26.0–34.0)
MCHC: 27.3 g/dL — ABNORMAL LOW (ref 30.0–36.0)
MCV: 72.5 fL — ABNORMAL LOW (ref 80.0–100.0)
Platelets: 297 10*3/uL (ref 150–400)
RBC: 4.4 MIL/uL (ref 3.87–5.11)
RDW: 16.7 % — ABNORMAL HIGH (ref 11.5–15.5)
WBC: 11.5 10*3/uL — ABNORMAL HIGH (ref 4.0–10.5)
nRBC: 0 % (ref 0.0–0.2)

## 2019-06-27 LAB — LIPASE, BLOOD: Lipase: 30 U/L (ref 11–51)

## 2019-06-27 LAB — I-STAT BETA HCG BLOOD, ED (MC, WL, AP ONLY): I-stat hCG, quantitative: <5 m[IU]/mL (ref <5)

## 2019-06-27 MED ORDER — SODIUM CHLORIDE 0.9 % IV BOLUS
1000.0000 mL | Freq: Once | INTRAVENOUS | Status: AC
  Administered 2019-06-27: 1000 mL via INTRAVENOUS

## 2019-06-27 MED ORDER — SODIUM CHLORIDE 0.9% FLUSH
3.0000 mL | Freq: Once | INTRAVENOUS | Status: AC
  Administered 2019-06-27: 3 mL via INTRAVENOUS

## 2019-06-27 MED ORDER — SODIUM CHLORIDE (PF) 0.9 % IJ SOLN
INTRAMUSCULAR | Status: AC
  Filled 2019-06-27: qty 50

## 2019-06-27 MED ORDER — IOHEXOL 300 MG/ML  SOLN
100.0000 mL | Freq: Once | INTRAMUSCULAR | Status: AC | PRN
  Administered 2019-06-27: 100 mL via INTRAVENOUS

## 2019-06-27 NOTE — ED Notes (Signed)
Pt transported to CT ?

## 2019-06-27 NOTE — ED Provider Notes (Signed)
[facility] DEPT Provider Note   CSN: [id number] Arrival date & time: 06/27/19  1957    History   Chief Complaint Chief Complaint  Patient presents with  . Abdominal Pain  . Pelvic Pain    HPI Debra Ware is a 53 y.o. female.     HPI  53 year old female presents with right lower abdominal pain.  Started about 3 days ago.  This is different than the lower abdominal pain she has been having from her newly diagnosed cancer.  This is inferior to that.  Some nausea but no vomiting.  No fevers or new back pain.  No urinary symptoms.  Mix of constipation and diarrhea but this is not essentially new.  Pain is not that bad right now because she took a Tylenol prior to arrival, rates it about a 4 out of 10. It is sharp in nature.  Past Medical History:  Diagnosis Date  . Asthma   . Hypertension     Patient Active Problem List   Diagnosis Date Noted  . Cancer of endocervical canal ([place]) 06/16/2019  . 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 Morbid obesity with BMI of 40.0-44.9, adult ([place]) 06/16/2019  . 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 Cough 06/16/2019  . Pain in joint involving pelvic region and thigh 06/16/2019    History reviewed. No pertinent surgical history.   OB History   No obstetric history on file.      Home Medications    Prior to Admission medications   Medication Sig Start Date End Date Taking? Authorizing Provider  budesonide-formoterol (SYMBICORT) 160-4.5 MCG/ACT inhaler Inhale 2 puffs into the lungs 2 (two) times daily. 05/26/19  Yes [provider]  IBUPROFEN PO Take 650 mg by mouth 2 (two) times daily before a meal.   Yes [provider]  olmesartan-hydrochlorothiazide (BENICAR HCT) 20-12.5 MG tablet Take 1 tablet by mouth daily as needed (blood pressure).    Yes  [provider]  oxyCODONE (OXY IR/ROXICODONE) 5 MG immediate release tablet Take 1 tablet (5 mg total) by mouth every 4 (four) hours as needed for severe pain. 06/16/19  Yes Everitt Amber, MD  senna (SENOKOT) 8.6 MG TABS tablet Take 1 tablet (8.6 mg total) by mouth at bedtime. 06/16/19  Yes Everitt Amber, MD  ondansetron (ZOFRAN ODT) 4 MG disintegrating tablet Take 1 tablet (4 mg total) by mouth every 8 (eight) hours as needed for nausea or vomiting. Patient not taking: Reported on 06/16/2019 06/02/19   Antonietta Breach, PA-C    Family History Family History  Problem Relation Age of Onset  . Heart attack Father   . Lung cancer Father   . Throat cancer Father   . Heart disease Father   . Diabetes Paternal Grandfather   . Heart disease Paternal Grandfather   . Hypertension Paternal Grandfather   . Breast cancer Neg Hx   . Ovarian cancer Neg Hx   . Colon cancer Neg Hx     Social History Social History   Tobacco Use  . Smoking status: Never Smoker  . Smokeless tobacco: Never Used  Substance Use Topics  . Alcohol use: Yes    Comment: occasionally  . Drug use: Never     Allergies   Azithromycin, Penicillins, and Sulfa antibiotics   Review of Systems Review of Systems  Constitutional: Negative for fever.  Gastrointestinal: Positive for  abdominal pain and nausea. Negative for vomiting.  Genitourinary: Negative for dysuria.  All other systems reviewed and are negative.    Physical Exam Updated Vital Signs BP (!) 150/82   Pulse 73   Temp 98.2 F (36.8 C) (Oral)   Resp 16   Ht 5\' 9"  (1.753 m)   Wt 122.5 kg   LMP 06/02/2019   SpO2 100%   BMI 39.87 kg/m   Physical Exam Vitals signs and nursing note reviewed.  Constitutional:      General: She is not in acute distress.    Appearance: She is well-developed. She is not ill-appearing or diaphoretic.  HENT:     Head: Normocephalic and atraumatic.     Right Ear: External ear normal.     Left Ear: External ear normal.      Nose: Nose normal.  Eyes:     General:        Right eye: No discharge.        Left eye: No discharge.  Cardiovascular:     Rate and Rhythm: Normal rate and regular rhythm.     Heart sounds: Normal heart sounds.  Pulmonary:     Effort: Pulmonary effort is normal.     Breath sounds: Normal breath sounds.  Abdominal:     Palpations: Abdomen is soft.     Tenderness: There is abdominal tenderness. There is no right CVA tenderness or left CVA tenderness.    Skin:    General: Skin is warm and dry.  Neurological:     Mental Status: She is alert.  Psychiatric:        Mood and Affect: Mood is not anxious.      ED Treatments / Results  Labs (all labs ordered are listed, but only abnormal results are displayed) Labs Reviewed  COMPREHENSIVE METABOLIC PANEL - Abnormal; Notable for the following components:      Result Value   Glucose, Bld 104 (*)    All other components within normal limits  CBC - Abnormal; Notable for the following components:   WBC 11.5 (*)    Hemoglobin 8.7 (*)    HCT 31.9 (*)    MCV 72.5 (*)    MCH 19.8 (*)    MCHC 27.3 (*)    RDW 16.7 (*)    All other components within normal limits  LIPASE, BLOOD  URINALYSIS, ROUTINE W REFLEX MICROSCOPIC  I-STAT BETA HCG BLOOD, ED ([facility], [facility], [facility] ONLY)    EKG None  Radiology No results found.  Procedures Procedures (including critical care time)  Medications Ordered in ED Medications  sodium chloride (PF) 0.9 % injection (has no administration in time range)  sodium chloride flush (NS) 0.9 % injection 3 mL (3 mLs Intravenous Given 06/27/19 2217)  sodium chloride 0.9 % bolus 1,000 mL (1,000 mLs Intravenous New Bag/Given 06/27/19 2216)     Initial Impression / Assessment and Plan / ED Course  I have reviewed the triage vital signs and the nursing notes.  Pertinent labs & imaging results that were available during my care of the patient were reviewed by me and considered in my medical decision making (see chart  for details).        Patient's labs show mild WBC elevation and stable anemia.  Given the location of her pain in association with the known cancer, CT abdomen and pelvis will be obtained.  She has declined pain meds at this time.  Care to Dr.  Leonette Monarch, with CT pending.  Final  Clinical Impressions(s) / ED Diagnoses   Final diagnoses:  None    ED Discharge Orders    None       Sherwood Gambler, MD 06/27/19 2344

## 2019-06-27 NOTE — ED Notes (Signed)
EDP [place] at bedside 

## 2019-06-27 NOTE — ED Notes (Signed)
Pt ambulated to BR with steady gait. Pt attempting to collect urine specimen.

## 2019-06-27 NOTE — ED Triage Notes (Signed)
Patient is complaining of right lower abdominal pain and right pelvic pain. Patient states it started a few days ago. Patient states she was dx with uterine and abdominal cancer.

## 2019-06-28 ENCOUNTER — Emergency Department (HOSPITAL_COMMUNITY): Payer: BC Managed Care – PPO

## 2019-06-28 LAB — URINALYSIS, ROUTINE W REFLEX MICROSCOPIC
Bacteria, UA: NONE SEEN
Bilirubin Urine: NEGATIVE
Glucose, UA: NEGATIVE mg/dL
Ketones, ur: NEGATIVE mg/dL
Leukocytes,Ua: NEGATIVE
Nitrite: NEGATIVE
Protein, ur: NEGATIVE mg/dL
Specific Gravity, Urine: 1.019 (ref 1.005–1.030)
pH: 5 (ref 5.0–8.0)

## 2019-06-28 MED ORDER — ACETAMINOPHEN 500 MG PO TABS
1000.0000 mg | ORAL_TABLET | Freq: Once | ORAL | Status: AC
  Administered 2019-06-28: 1000 mg via ORAL
  Filled 2019-06-28: qty 2

## 2019-06-28 NOTE — ED Provider Notes (Signed)
I assumed care of this patient from Dr. Regenia Skeeter at 2330.  Please see their note for further details of Hx, PE.  Briefly patient is a 53 y.o. female who presented with lower abdominal and right lower abdomen pain.  Patient has a history of recently diagnosed uterine cancer.  Plan is to obtain a CT scan to rule out intra-abdominal inflammatory process such as appendicitis..   CT with new right ovarian mass.  No other acute changes or evidence of appendicitis.  Given sudden onset of pain, ultrasound was obtained and ruled out ovarian torsion.  [facility] is [facility].  The patient appears reasonably screened and/or stabilized for discharge and I doubt any other medical condition or other [facility] requiring further screening, evaluation, or treatment in the ED at this time prior to discharge.  The patient is safe for discharge with strict return precautions.   The patient appears reasonably screened and/or stabilized for discharge and I doubt any other medical condition or other [facility] requiring further screening, evaluation, or treatment in the ED at this time prior to discharge.  Disposition: Discharge  Condition: Good  I have discussed the results, Dx and Tx plan with the patient who expressed understanding and agree(s) with the plan. Discharge instructions discussed at great length. The patient was given strict return precautions who verbalized understanding of the instructions. No further questions at time of discharge.    ED Discharge Orders    None       Follow Up: Cancer Center         Cardama, Grayce Sessions, MD 06/28/19 [phone]

## 2019-08-15 ENCOUNTER — Other Ambulatory Visit: Payer: Self-pay

## 2019-08-15 ENCOUNTER — Observation Stay (HOSPITAL_COMMUNITY)
Admission: EM | Admit: 2019-08-15 | Discharge: 2019-08-17 | Disposition: A | Discharge status: Home / Self Care | Payer: BC Managed Care – PPO | Attending: Internal Medicine | Admitting: Internal Medicine

## 2019-08-15 ENCOUNTER — Encounter (HOSPITAL_COMMUNITY): Payer: Self-pay

## 2019-08-15 ENCOUNTER — Emergency Department (HOSPITAL_COMMUNITY): Payer: BC Managed Care – PPO

## 2019-08-15 DIAGNOSIS — Z79899 Other long term (current) drug therapy: Secondary | ICD-10-CM | POA: Insufficient documentation

## 2019-08-15 DIAGNOSIS — I480 Paroxysmal atrial fibrillation: Principal | ICD-10-CM | POA: Diagnosis present

## 2019-08-15 DIAGNOSIS — Z20828 Contact with and (suspected) exposure to other viral communicable diseases: Secondary | ICD-10-CM | POA: Diagnosis not present

## 2019-08-15 DIAGNOSIS — C530 Malignant neoplasm of endocervix: Secondary | ICD-10-CM | POA: Diagnosis present

## 2019-08-15 DIAGNOSIS — I10 Essential (primary) hypertension: Secondary | ICD-10-CM | POA: Insufficient documentation

## 2019-08-15 DIAGNOSIS — Z8541 Personal history of malignant neoplasm of cervix uteri: Secondary | ICD-10-CM | POA: Diagnosis not present

## 2019-08-15 DIAGNOSIS — R002 Palpitations: Secondary | ICD-10-CM

## 2019-08-15 DIAGNOSIS — Z8543 Personal history of malignant neoplasm of ovary: Secondary | ICD-10-CM | POA: Insufficient documentation

## 2019-08-15 DIAGNOSIS — I4891 Unspecified atrial fibrillation: Secondary | ICD-10-CM

## 2019-08-15 DIAGNOSIS — R079 Chest pain, unspecified: Secondary | ICD-10-CM | POA: Diagnosis present

## 2019-08-15 HISTORY — DX: Secondary malignant neoplasm of unspecified site: C79.9

## 2019-08-15 HISTORY — DX: Malignant neoplasm of unspecified ovary: C56.9

## 2019-08-15 LAB — CBC
HCT: 33.3 % — ABNORMAL LOW (ref 36.0–46.0)
Hemoglobin: 9.6 g/dL — ABNORMAL LOW (ref 12.0–15.0)
MCH: 21.4 pg — ABNORMAL LOW (ref 26.0–34.0)
MCHC: 28.8 g/dL — ABNORMAL LOW (ref 30.0–36.0)
MCV: 74.3 fL — ABNORMAL LOW (ref 80.0–100.0)
Platelets: 212 10*3/uL (ref 150–400)
RBC: 4.48 MIL/uL (ref 3.87–5.11)
RDW: 24.2 % — ABNORMAL HIGH (ref 11.5–15.5)
WBC: 6.3 10*3/uL (ref 4.0–10.5)
nRBC: 0 % (ref 0.0–0.2)

## 2019-08-15 LAB — BASIC METABOLIC PANEL
Anion gap: 8 (ref 5–15)
BUN: 16 mg/dL (ref 6–20)
CO2: 27 mmol/L (ref 22–32)
Calcium: 9.4 mg/dL (ref 8.9–10.3)
Chloride: 107 mmol/L (ref 98–111)
Creatinine, Ser: 0.76 mg/dL (ref 0.44–1.00)
GFR calc Af Amer: >60 mL/min (ref >60)
GFR calc non Af Amer: >60 mL/min (ref >60)
Glucose, Bld: 88 mg/dL (ref 70–99)
Potassium: 4.4 mmol/L (ref 3.5–5.1)
Sodium: 142 mmol/L (ref 135–145)

## 2019-08-15 LAB — TROPONIN I (HIGH SENSITIVITY): Troponin I (High Sensitivity): 27 ng/L — ABNORMAL HIGH (ref <18)

## 2019-08-15 NOTE — ED Provider Notes (Signed)
[facility] EMERGENCY DEPARTMENT Provider Note   CSN: [id number] Arrival date & time: 08/15/19  1804     History   Chief Complaint Chief Complaint  Patient presents with  . Chest Pain    HPI Debra Ware is a 53 y.o. female with a past medical history of obesity and metastatic cervical cancer who presents emergency department after having palpitations and chest pressure.  Patient reports she was at home sitting in chair when she suddenly started feeling heavy heartbeat and some midsternal chest pressure so to associated with some shortness of breath and diaphoresis.  Patient tried resting and this did not improve symptoms so she presented to her primary care doctor.  Primary care doctor reported she was in atrial fibrillation with rapid ventricular response.  Patient has not had a history of atrial fibrillation in the past.  Patient was transported to the emergency department for further evaluation and management.  Patient was given IV metoprolol and a 500 cc bolus of IV fluid after which patient felt her symptoms resolve.  Patient reports she is not currently having any chest pressure, palpitations, or shortness of breath.  Patient reports several weeks ago she started chemotherapy for her cancer.  Patient denies any cardiac history.  Patient reports she used to have hypertension and prediabetes however she lost weight and has not been on any medication for hypertension recently.     The history is provided by the patient.    Past Medical History:  Diagnosis Date  . Asthma   . Cancer ([place])   . Hypertension   . Metastatic cancer ([place])   . Ovarian cancer [facility])     Patient Active Problem List   Diagnosis Date Noted  . 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 Morbid obesity with BMI of 40.0-44.9, adult ([place]) 06/16/2019  .  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 Cough 06/16/2019  . Pain in joint involving pelvic region and thigh 06/16/2019    History reviewed. No pertinent surgical history.   OB History   No obstetric history on file.      Home Medications    Prior to Admission medications   Medication Sig Start Date End Date Taking? Authorizing Provider  acetaminophen (TYLENOL) 650 MG CR tablet Take 650 mg by mouth as needed for pain.   Yes [provider]  Amino Acids (L-CARNITINE) LIQD Take 14.8 mLs by mouth 2 (two) times daily. 1 tablespoon   Yes [provider]  budesonide-formoterol (SYMBICORT) 160-4.5 MCG/ACT inhaler Inhale 2 puffs into the lungs 2 (two) times daily. 05/26/19  Yes [provider]  dexamethasone (DECADRON) 4 MG tablet Take 8 mg by mouth daily. Take for four days after chemo 08/08/19  Yes [provider]  L-Glutamine POWD Take 10 g by mouth See admin instructions. 1 scoop in juice twice a day for 7 days after chemo.   Yes [provider]  lidocaine-prilocaine (EMLA) cream Apply 1 application topically See admin instructions. 1 hour before chemo treatment. 07/17/19  Yes [provider]  OVER THE COUNTER MEDICATION Take 1 capsule by mouth 2 (two) times daily. Ferrasorb from [place]; iron with vitamin B and C   Yes [provider]  VITAMIN D PO Take 2 capsules by mouth daily.   Yes [provider]  ondansetron (ZOFRAN ODT) 4 MG  disintegrating tablet Take 1 tablet (4 mg total) by mouth every 8 (eight) hours as needed for nausea or vomiting. Patient not taking: Reported on 06/16/2019 06/02/19   Antonietta Breach, PA-C  oxyCODONE (OXY IR/ROXICODONE) 5 MG immediate release tablet Take 1 tablet (5 mg total) by mouth every 4 (four) hours as needed for severe pain. Patient not taking: Reported on 08/15/2019 06/16/19   Everitt Amber, MD  senna (SENOKOT) 8.6 MG TABS tablet Take 1 tablet (8.6 mg total)  by mouth at bedtime. Patient not taking: Reported on 08/15/2019 06/16/19   Everitt Amber, MD    Family History Family History  Problem Relation Age of Onset  . Heart attack Father   . Lung cancer Father   . Throat cancer Father   . Heart disease Father   . Diabetes Paternal Grandfather   . Heart disease Paternal Grandfather   . Hypertension Paternal Grandfather   . Breast cancer Neg Hx   . Ovarian cancer Neg Hx   . Colon cancer Neg Hx     Social History Social History   Tobacco Use  . Smoking status: Never Smoker  . Smokeless tobacco: Never Used  Substance Use Topics  . Alcohol use: Yes    Comment: occasionally  . Drug use: Never     Allergies   Azithromycin, Metformin and related, Penicillins, and Sulfa antibiotics   Review of Systems Review of Systems  Constitutional: Negative for fever.  HENT: Negative for congestion and trouble swallowing.   Respiratory: Positive for shortness of breath. Negative for cough.   Cardiovascular: Positive for chest pain and palpitations. Negative for leg swelling.  Gastrointestinal: Positive for abdominal pain (chronic). Negative for constipation, diarrhea, nausea and vomiting.  Genitourinary: Negative for dysuria.  Skin: Negative for rash.  Neurological: Negative for syncope, speech difficulty, weakness, numbness and headaches.  Psychiatric/Behavioral: Negative for confusion.     Physical Exam Updated Vital Signs BP (!) 106/59   Pulse (!) 59   Temp 98.7 F (37.1 C) (Oral)   Resp 20   Ht 5\' 10"  (1.778 m)   Wt 119.3 kg   SpO2 98%   BMI 37.74 kg/m   Physical Exam Constitutional:      General: She is not in acute distress.    Appearance: She is obese. She is not diaphoretic.  HENT:     Head: Normocephalic and atraumatic.     Right Ear: External ear normal.     Left Ear: External ear normal.     Nose: Nose normal.     Mouth/Throat:     Mouth: Mucous membranes are moist.     Pharynx: Oropharynx is clear.  Eyes:      Pupils: Pupils are equal, round, and reactive to light.  Neck:     Musculoskeletal: Neck supple.  Cardiovascular:     Rate and Rhythm: Normal rate and regular rhythm.  Pulmonary:     Effort: Pulmonary effort is normal. No respiratory distress.     Breath sounds: Normal breath sounds. No wheezing, rhonchi or rales.  Chest:     Chest wall: No tenderness.  Abdominal:     Palpations: Abdomen is soft.     Tenderness: There is no abdominal tenderness. There is no guarding or rebound.  Musculoskeletal:     Right lower leg: No edema.     Left lower leg: No edema.  Skin:    General: Skin is warm and dry.  Neurological:     General: No focal deficit present.  Mental Status: She is alert and oriented to person, place, and time.     Cranial Nerves: No cranial nerve deficit.     Sensory: No sensory deficit.     Motor: No weakness.     Coordination: Coordination normal.      ED Treatments / Results  Labs (all labs ordered are listed, but only abnormal results are displayed) Labs Reviewed  CBC - Abnormal; Notable for the following components:      Result Value   Hemoglobin 9.6 (*)    HCT 33.3 (*)    MCV 74.3 (*)    MCH 21.4 (*)    MCHC 28.8 (*)    RDW 24.2 (*)    All other components within normal limits  TROPONIN I (HIGH SENSITIVITY) - Abnormal; Notable for the following components:   Troponin I (High Sensitivity) 27 (*)    All other components within normal limits  TROPONIN I (HIGH SENSITIVITY) - Abnormal; Notable for the following components:   Troponin I (High Sensitivity) 33 (*)    All other components within normal limits  BASIC METABOLIC PANEL  TSH  POC URINE PREG, ED    EKG EKG Interpretation  Date/Time:  Friday August 15 2019 22:10:33 EDT Ventricular Rate:  55 PR Interval:    QRS Duration: 94 QT Interval:  449 QTC Calculation: 430 R Axis:   63 Text Interpretation:  Sinus rhythm Atrial premature complex Abnormal T, consider ischemia, lateral leads No  STEMI  Confirmed by Nanda Quinton [phone]) on 08/15/2019 10:13:43 PM   Radiology Dg Chest 2 View  Result Date: 08/15/2019 CLINICAL DATA:  Patient with tachycardia. EXAM: CHEST - 2 VIEW COMPARISON:  None. FINDINGS: Monitoring leads overlie the patient. Normal cardiac and mediastinal contours. No consolidative pulmonary opacities. No pleural effusion or pneumothorax. Thoracic spine degenerative changes. Port-A-Cath tip projects over the superior vena cava. IMPRESSION: No active cardiopulmonary disease. Electronically Signed   By: Lovey Newcomer M.D.   On: 08/15/2019 19:26    Procedures Procedures (including critical care time)  Medications Ordered in ED Medications  apixaban (ELIQUIS) tablet 5 mg (5 mg Oral Given 08/16/19 0133)     Initial Impression / Assessment and Plan / ED Course  I have reviewed the triage vital signs and the nursing notes.  Pertinent labs & imaging results that were available during my care of the patient were reviewed by me and considered in my medical decision making (see chart for details).       Concern for new onset atrial fibrillation, patient is currently in normal sinus rhythm.  Patient received 10 mg of metoprolol IV with EMS.  Appears patient converted back to normal sinus rhythm.  No EKG from [facility] where patient was initially seen and found to be in A. fib with RVR currently available. CHADsVAS of 1 (sex). Patient used to have hypertension however no longer.  Initial troponin mildly elevated at 27, repeat troponin pending.  Patient care transferred to Dr. Dayna Barker at approximately 12 AM, please see his note for further details.  Plan to follow-up on repeat troponin and reassess.  Patient seen and plan discussed with Dr. Laverta Baltimore.  Final Clinical Impressions(s) / ED Diagnoses   Final diagnoses:  Chest pain, unspecified type  Atrial fibrillation with RVR [facility])    ED Discharge Orders         Ordered    Amb referral to AFIB Clinic     08/15/19  2110  Betsey Amen, MD 08/16/19 Margaretha Glassing    Margette Fast, MD 08/16/19 [phone]

## 2019-08-15 NOTE — ED Triage Notes (Signed)
Pt arrived via [facility] from [facility]. Pt was lying in bed and heart started to race so pt went to be seen and was in A-fib w/RVR at [facility]. Pt doesn't have any hx of A-fibl. Pt last chemo tx was oast week. EMS gave metoprolol 10mg  IV and NS 528ml.  Pt is A&Ox4. Pt is NSR on monitor. VS WNL

## 2019-08-15 NOTE — ED Notes (Signed)
Pt developed irregular rhythm on monitor. Snapped another EKG and gave to MD

## 2019-08-15 NOTE — ED Notes (Addendum)
OK per RN Sarah for pt to have something to drink. Pt requested ginger ale and is given the same

## 2019-08-16 ENCOUNTER — Encounter (HOSPITAL_COMMUNITY): Payer: Self-pay | Admitting: Internal Medicine

## 2019-08-16 ENCOUNTER — Observation Stay (HOSPITAL_BASED_OUTPATIENT_CLINIC_OR_DEPARTMENT_OTHER): Payer: BC Managed Care – PPO

## 2019-08-16 DIAGNOSIS — Z20828 Contact with and (suspected) exposure to other viral communicable diseases: Secondary | ICD-10-CM | POA: Diagnosis not present

## 2019-08-16 DIAGNOSIS — I4891 Unspecified atrial fibrillation: Secondary | ICD-10-CM | POA: Diagnosis not present

## 2019-08-16 DIAGNOSIS — R079 Chest pain, unspecified: Secondary | ICD-10-CM | POA: Diagnosis not present

## 2019-08-16 DIAGNOSIS — I480 Paroxysmal atrial fibrillation: Secondary | ICD-10-CM | POA: Diagnosis present

## 2019-08-16 DIAGNOSIS — I10 Essential (primary) hypertension: Secondary | ICD-10-CM | POA: Diagnosis not present

## 2019-08-16 HISTORY — DX: Paroxysmal atrial fibrillation: I48.0

## 2019-08-16 LAB — ECHOCARDIOGRAM COMPLETE
Height: 70 in
Weight: 4112 oz

## 2019-08-16 LAB — LIPID PANEL
Cholesterol: 137 mg/dL (ref 0–200)
HDL: 38 mg/dL — ABNORMAL LOW (ref >40)
LDL Cholesterol: 85 mg/dL (ref 0–99)
Total CHOL/HDL Ratio: 3.6 RATIO
Triglycerides: 72 mg/dL (ref <150)
VLDL: 14 mg/dL (ref 0–40)

## 2019-08-16 LAB — TROPONIN I (HIGH SENSITIVITY)
Troponin I (High Sensitivity): 33 ng/L — ABNORMAL HIGH (ref <18)
Troponin I (High Sensitivity): 34 ng/L — ABNORMAL HIGH (ref <18)
Troponin I (High Sensitivity): 35 ng/L — ABNORMAL HIGH (ref <18)

## 2019-08-16 LAB — HIV ANTIBODY (ROUTINE TESTING W REFLEX): HIV Screen 4th Generation wRfx: NONREACTIVE

## 2019-08-16 LAB — SARS CORONAVIRUS 2 (TAT 6-24 HRS): SARS Coronavirus 2: NEGATIVE

## 2019-08-16 LAB — MAGNESIUM: Magnesium: 1.8 mg/dL (ref 1.7–2.4)

## 2019-08-16 LAB — TSH: TSH: 1.718 u[IU]/mL (ref 0.350–4.500)

## 2019-08-16 MED ORDER — MOMETASONE FURO-FORMOTEROL FUM 200-5 MCG/ACT IN AERO
2.0000 | INHALATION_SPRAY | Freq: Two times a day (BID) | RESPIRATORY_TRACT | Status: DC
  Administered 2019-08-17: 2 via RESPIRATORY_TRACT
  Filled 2019-08-16: qty 8.8

## 2019-08-16 MED ORDER — POLYETHYLENE GLYCOL 3350 17 G PO PACK
17.0000 g | PACK | Freq: Every day | ORAL | Status: DC
  Administered 2019-08-16 – 2019-08-17 (×2): 17 g via ORAL
  Filled 2019-08-16 (×2): qty 1

## 2019-08-16 MED ORDER — APIXABAN 5 MG PO TABS
5.0000 mg | ORAL_TABLET | Freq: Two times a day (BID) | ORAL | Status: DC
  Administered 2019-08-16: 5 mg via ORAL
  Filled 2019-08-16 (×3): qty 1

## 2019-08-16 MED ORDER — ACETAMINOPHEN 325 MG PO TABS: 650.0000 mg | ORAL_TABLET | ORAL | Status: DC | PRN

## 2019-08-16 MED ORDER — ONDANSETRON HCL 4 MG/2ML IJ SOLN: 4.0000 mg | Freq: Four times a day (QID) | INTRAMUSCULAR | Status: DC | PRN

## 2019-08-16 MED ORDER — LEVOCARNITINE 1 GM/10ML PO SOLN
1.5000 g | Freq: Two times a day (BID) | ORAL | Status: DC
  Administered 2019-08-16 – 2019-08-17 (×3): 1500 mg via ORAL
  Filled 2019-08-16 (×5): qty 15

## 2019-08-16 MED ORDER — ENOXAPARIN SODIUM 40 MG/0.4ML ~~LOC~~ SOLN
40.0000 mg | Freq: Every day | SUBCUTANEOUS | Status: DC
  Filled 2019-08-16: qty 0.4

## 2019-08-16 NOTE — ED Provider Notes (Signed)
1:31 AM Assumed care from Drs. Long and Kuefler, please see their note for full history, physical and decision making until this point. In brief this is a 53 y.o. year old female who presented to the ED tonight with Chest Pain     Likely afib rvr awaiting repeat troponin.   Repeat troponin 33. Still with mild discomfort but on my examination patient states that she had acute onset of chest pressure that was associated with diaphoresis, shortness of breath and nausea.  She then felt her heart beating hard but not necessarily fast.  It did feel irregular.  This persisted so she called her primary doctor and had an evaluation done there.  I do not believe the previous providers had the EKG available to them but it does look like she was in A. fib RVR at that time.  EMS was called and brought her here for further evaluation.  Patient is asymptomatic at this time.  I reviewed her EKG she has some mild ST changes in her lateral leads with some T wave inversion in the same.  I discussed with her oncology center where she had an EKG done 2 weeks ago and there was no ST changes at that time and thus I believe this is new.  Initial plan was possible discharge if troponins were okay as these were probably demand from the A. fib however with her description of the symptoms and new T wave inversions with mild ST changes I will discuss with hospitalist for admission for further evaluation and possible ultrasound. eliquis ordered.   CHA2DS2/VAS Stroke Risk Points  Current as of just now     2 >= 2 Points: High Risk  1 - 1.99 Points: Medium Risk  0 Points: Low Risk    The patient's score has not changed in the past year.: No Change     Details    This score determines the patient's risk of having a stroke if the  patient has atrial fibrillation.       Points Metrics  0 Has Congestive Heart Failure:  No    Current as of just now  0 Has Vascular Disease:  No    Current as of just now  1 Has Hypertension:  Yes     Current as of just now  0 Age:  22    Current as of just now  0 Has Diabetes:  No    Current as of just now  0 Had Stroke:  No  Had TIA:  No  Had thromboembolism:  No    Current as of just now  1 Female:  Yes    Current as of just now      EKG Interpretation  Date/Time:  Friday August 15 2019 22:10:33 EDT Ventricular Rate:  55 PR Interval:    QRS Duration: 94 QT Interval:  449 QTC Calculation: 430 R Axis:   63 Text Interpretation:  Sinus rhythm Atrial premature complex Abnormal T, consider ischemia, lateral leads No STEMI  Confirmed by Nanda Quinton [phone]) on 08/15/2019 10:13:43 PM          Labs, studies and imaging reviewed by myself and considered in medical decision making if ordered. Imaging interpreted by radiology.  Labs Reviewed  CBC - Abnormal; Notable for the following components:      Result Value   Hemoglobin 9.6 (*)    HCT 33.3 (*)    MCV 74.3 (*)    MCH 21.4 (*)    MCHC  28.8 (*)    RDW 24.2 (*)    All other components within normal limits  TROPONIN I (HIGH SENSITIVITY) - Abnormal; Notable for the following components:   Troponin I (High Sensitivity) 27 (*)    All other components within normal limits  TROPONIN I (HIGH SENSITIVITY) - Abnormal; Notable for the following components:   Troponin I (High Sensitivity) 33 (*)    All other components within normal limits  BASIC METABOLIC PANEL  TSH  POC URINE PREG, ED    DG Chest 2 View  Final Result      No follow-ups on file.    Latrese Carolan, Corene Cornea, MD 08/16/19 [phone]

## 2019-08-16 NOTE — Progress Notes (Addendum)
Please see H&P from this morning for full details. 53 y.o. female with history of metastatic ovarian cancer ( CT abd/pelvis in 06/2019 showed right ovarian 6 x10 cm complex mass with lymphadenopathy and pulm mets, transvaginal USG reported 8 x 6 cm mass) being treated by [facility] [place] [facility] with chemotherapy (2 sessions so far) presented to PCP office with c/o palpitations and associated chest pressure last evening after her lunch. Patient was found to be in A. fib with RVR.  EMS was called and patient was transferred to the ER.  On the way EMS gave patient metoprolol 5 mg IV and 500cc bolus normal saline with which patient's symptoms improved and she converted to sinus rhythm. Patient apparently had history of hypertension/diabetes which have resolved since patient lost 100 pounds. In the ER patient remained in sinus rhythm, chest pressure resolved,hemoglobin of 9.6 creatinine 1.7, high-sensitivity Troponins at  27 and 33.  TSH was 1.718, COVID-19 test pending.Patient's chads 2 vasc score is only 1 at this time, she recieved apixabanx1 in the ED and admitted for further observation with cardiology consult request. She apparently had a recent 2D echo through her primary cardiologist.  Patient evaluated by cardiology and recommend metoprolol for prn palpitations, no anticoagulation/echo. If echo nl, no need for ischemic w/u. Will f/u and discharge planning possibly later today.

## 2019-08-16 NOTE — H&P (Signed)
History and Physical    Debra Ware [id number] DOB: Aug 22, 1966 DOA: 08/15/2019  PCP: Patient, No Pcp Per  Patient coming from: Home.  Chief Complaint: Chest pain palpitations.  HPI: Debra Ware is a 53 y.o. female with history of metastatic cervical cancer being treated by cancer center of the [place] at [facility] with chemotherapy has received so far to chemotherapy sessions start developing palpitation and chest pressure last evening after her lunch.  It persisted and patient decided to go to her primary care physician.  Over the patient was found to be in A. fib with RVR.  EMS was called and patient was transferred to the ER.  On the way EMS gave patient metoprolol 5 mg IV and final cc bolus normal saline and by then patient's symptom improved and patient change to sinus rhythm.  Patient used to have history of hypertension diabetes which has resolved after patient lost 100 pounds.  ED Course: In the ER patient was in sinus rhythm chest pressure improved resolved high-sensitivity troponin was 27 and 33.  TSH was 1.718 COVID-19 test was pending.  Rest of the labs show hemoglobin of 9.6 creatinine 1.7 patient was started on apixaban and admitted for further observation.  Review of Systems: As per HPI, rest all negative.   Past Medical History:  Diagnosis Date  . Asthma   . Cancer ([place])   . Hypertension   . Metastatic cancer ([place])   . Ovarian cancer [facility])     History reviewed. No pertinent surgical history.   reports that she has never smoked. She has never used smokeless tobacco. She reports current alcohol use. She reports that she does not use drugs.  Allergies  Allergen Reactions  . Azithromycin Hives  . Metformin And Related Other (See Comments)    Per pt: makes her eat  . Penicillins Rash    Did it involve swelling of the face/tongue/throat, SOB, or low BP? Yes Did it involve sudden or severe rash/hives, skin peeling, or any reaction on the inside of your mouth or  nose? No Did you need to seek medical attention at a hospital or doctor's office? No When did it last happen?unknown  If all above answers are "NO", may proceed with cephalosporin use.;  . Sulfa Antibiotics Rash    Family History  Problem Relation Age of Onset  . Heart attack Father   . Lung cancer Father   . Throat cancer Father   . Heart disease Father   . Diabetes Paternal Grandfather   . Heart disease Paternal Grandfather   . Hypertension Paternal Grandfather   . Breast cancer Neg Hx   . Ovarian cancer Neg Hx   . Colon cancer Neg Hx     Prior to Admission medications   Medication Sig Start Date End Date Taking? Authorizing Provider  acetaminophen (TYLENOL) 650 MG CR tablet Take 650 mg by mouth as needed for pain.   Yes [provider]  Amino Acids (L-CARNITINE) LIQD Take 14.8 mLs by mouth 2 (two) times daily. 1 tablespoon   Yes [provider]  budesonide-formoterol (SYMBICORT) 160-4.5 MCG/ACT inhaler Inhale 2 puffs into the lungs 2 (two) times daily. 05/26/19  Yes [provider]  dexamethasone (DECADRON) 4 MG tablet Take 8 mg by mouth daily. Take for four days after chemo 08/08/19  Yes [provider]  L-Glutamine POWD Take 10 g by mouth See admin instructions. 1 scoop in juice twice a day for 7 days after chemo.   Yes [provider]  lidocaine-prilocaine (EMLA) cream Apply 1 application topically See admin instructions. 1 hour before chemo treatment. 07/17/19  Yes [provider]  OVER THE COUNTER MEDICATION Take 1 capsule by mouth 2 (two) times daily. Ferrasorb from [place]; iron with vitamin B and C   Yes [provider]  VITAMIN D PO Take 2 capsules by mouth daily.   Yes [provider]  ondansetron (ZOFRAN ODT) 4 MG disintegrating tablet Take 1 tablet (4 mg total) by mouth every 8 (eight) hours as needed for nausea or vomiting. Patient not taking: Reported on 06/16/2019 06/02/19   Antonietta Breach, PA-C   oxyCODONE (OXY IR/ROXICODONE) 5 MG immediate release tablet Take 1 tablet (5 mg total) by mouth every 4 (four) hours as needed for severe pain. Patient not taking: Reported on 08/15/2019 06/16/19   Everitt Amber, MD  senna (SENOKOT) 8.6 MG TABS tablet Take 1 tablet (8.6 mg total) by mouth at bedtime. Patient not taking: Reported on 08/15/2019 06/16/19   Everitt Amber, MD    Physical Exam: Constitutional: Moderately built and nourished. Vitals:   08/16/19 0130 08/16/19 0157 08/16/19 0200 08/16/19 0215  BP:  114/60 115/66 120/72  Pulse: (!) 58 (!) 53 (!) 108 67  Resp:  17 17 17   Temp:      TempSrc:      SpO2: 99% 100% 100% 98%  Weight:      Height:       Eyes: Anicteric no pallor. ENMT: No discharge from the ears eyes nose or mouth. Neck: No mass felt.  No neck rigidity. Respiratory: No rhonchi or crepitations. Cardiovascular: S1-S2 heard. Abdomen: Soft nontender bowel sound present. Musculoskeletal: No edema.  No joint effusion. Skin: No rash. Neurologic: Alert awake oriented to time place and person.  Moves all extremities. Psychiatric: Appears normal.   Labs on Admission: I have personally reviewed following labs and imaging studies  CBC: Recent Labs  Lab 08/15/19 1811  WBC 6.3  HGB 9.6*  HCT 33.3*  MCV 74.3*  PLT 99991111   Basic Metabolic Panel: Recent Labs  Lab 08/15/19 1811  NA 142  K 4.4  CL 107  CO2 27  GLUCOSE 88  BUN 16  CREATININE 0.76  CALCIUM 9.4   GFR: Estimated Creatinine Clearance: 115.3 mL/min (by C-G formula based on SCr of 0.76 mg/dL). Liver Function Tests: No results for input(s): AST, ALT, ALKPHOS, BILITOT, PROT, ALBUMIN in the last 168 hours. No results for input(s): LIPASE, AMYLASE in the last 168 hours. No results for input(s): AMMONIA in the last 168 hours. Coagulation Profile: No results for input(s): INR, PROTIME in the last 168 hours. Cardiac Enzymes: No results for input(s): CKTOTAL, CKMB, CKMBINDEX, TROPONINI in the last 168 hours.  BNP (last 3 results) No results for input(s): PROBNP in the last 8760 hours. HbA1C: No results for input(s): HGBA1C in the last 72 hours. CBG: No results for input(s): GLUCAP in the last 168 hours. Lipid Profile: No results for input(s): CHOL, HDL, LDLCALC, TRIG, CHOLHDL, LDLDIRECT in the last 72 hours. Thyroid Function Tests: Recent Labs    08/15/19 2306  TSH 1.718   Anemia Panel: No results for input(s): VITAMINB12, FOLATE, FERRITIN, TIBC, IRON, RETICCTPCT in the last 72 hours. Urine analysis:    Component Value Date/Time   COLORURINE YELLOW 06/27/2019 2037   APPEARANCEUR HAZY (A) 06/27/2019 2037   LABSPEC 1.019 06/27/2019 2037   PHURINE 5.0 06/27/2019 2037   GLUCOSEU NEGATIVE 06/27/2019 2037   HGBUR SMALL (A) 06/27/2019 2037  [place] NEGATIVE 06/27/2019 2037   [place] NEGATIVE 06/27/2019 2037   PROTEINUR NEGATIVE 06/27/2019 2037   NITRITE NEGATIVE 06/27/2019 2037   LEUKOCYTESUR NEGATIVE 06/27/2019 2037   Sepsis Labs: @LABRCNTIP (procalcitonin:4,lacticidven:4) )No results found for this or any previous visit (from the past 240 hour(s)).   Radiological Exams on Admission: Dg Chest 2 View  Result Date: 08/15/2019 CLINICAL DATA:  Patient with tachycardia. EXAM: CHEST - 2 VIEW COMPARISON:  None. FINDINGS: Monitoring leads overlie the patient. Normal cardiac and mediastinal contours. No consolidative pulmonary opacities. No pleural effusion or pneumothorax. Thoracic spine degenerative changes. Port-A-Cath tip projects over the superior vena cava. IMPRESSION: No active cardiopulmonary disease. Electronically Signed   By: Lovey Newcomer M.D.   On: 08/15/2019 19:26    EKG: Independently reviewed.  Normal sinus rhythm.  Assessment/Plan Principal Problem:   Chest pain Active Problems:   Cancer of endocervical canal (HCC)   PAF (paroxysmal atrial fibrillation) ([place])    1. Chest pain likely precipitated by A. fib with RVR has improved.  Presently chest pain-free.  Closely  monitor in telemetry. 2. A. fib with RVR has improved after patient was given IV metoprolol by EMS and fluid bolus.  Presently in sinus rhythm.  Patient's chads 2 vasc score is only 1 at this time.  Patient was given on apixaban in the ER but will need to discuss with cardiology about further dosing about anticoagulation.  Patient has had a recent 2D echo by patient's cardiologist per patient.  S. 3. Metastatic cervical cancer being followed by oncologist at [place] of the medications at [facility]. 4. Microcytic hypochromic anemia appears to be chronic follow CBC could be related to chemo. 5. Previous history of hypertension and diabetes mellitus which has resolved presently not on any medication after patient lost 100 pounds.  COVID-19 test is pending.   DVT prophylaxis: Lovenox. Code Status: Full code. Family Communication: Patient's family at the bedside. Disposition Plan: Home. Consults called: Cardiology. Admission status: Observation.   Rise Patience MD [facility] Pager [phone].  If 7PM-7AM, please contact night-coverage [email or web address] Password [email or web address]  08/16/2019, 2:50 AM

## 2019-08-16 NOTE — Progress Notes (Signed)
  Echocardiogram 2D Echocardiogram has been performed.  Debra Ware 08/16/2019, 12:43 PM

## 2019-08-16 NOTE — Consult Note (Signed)
Cardiology Consultation:   Patient ID: Debra Ware MRN: [id number]; DOB: September 02, 1966  Admit date: 08/15/2019 Date of Consult: 08/16/2019  Primary Care Provider: Patient, No Pcp Per Primary Cardiologist: Fransico Him, MD - new Primary Electrophysiologist:  None    Patient Profile:   Debra Ware is a 53 y.o. female with a hx of metastatic cervical cancer with lesions in both lungs undergoing chemotherapy who is being seen today for the evaluation of new onset atrial fibrillation and chest pain at the request of Dr. Earnest Conroy.  History of Present Illness:   Debra Ware has a history of morbid obesity, HTN and DM - HTN and DM resolved with a 100 lb weight loss in 2015. 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 She moved to [place] because the rest of her family relocated to [place]. She helped start the [employer] in [place]. She has recently started chemotherapy for metastatic cervical cancer with [facility] in [place]. She has received two sessions of carboplatin and paclitaxel three weeks apart. Last session was 08/08/19. A third agent, bezacizumab, will be added on 9/24. She was sitting in a recliner after dinner yesterday and felt chest pressure that was initially relieved with belching. However, she then felt her heart pounding irregularly. After resting, this did not resolve and she went to her PCP. EKG with PCP was Afib with RVR and EMS was dispatched. EMS gave IV lopressor 5 mg with some improvement. Gave second dose of IV lopressor without resolution. However, upon arrival to [facility], she was in sinus rhythm.  However, EKG with new TWI AVL.   On my interview, she had not had chest pressure before. She has stopped working while undergoing chemotherapy and is not very active at home. She tries to set a goal every day and some days the goal is to simply get dressed. On days that she is more active and prior to starting chemo, she denies  chest pain and palpitations.   She doesn't smoke and her pre-diabetes and HTN had resolved with her weight loss. She takes prednisone for 4 days after every chemo session, so her sugars have been somewhat less controlled. Her father had heart disease and her mother had Afib, both are deceased. She has one sister and she is healthy.     Heart Pathway Score:     Past Medical History:  Diagnosis Date   Asthma    Cancer ([place])    Hypertension    Metastatic cancer ([place])    Ovarian cancer ([place])     History reviewed. No pertinent surgical history.   Home Medications:  Prior to Admission medications   Medication Sig Start Date End Date Taking? Authorizing Provider  acetaminophen (TYLENOL) 650 MG CR tablet Take 650 mg by mouth as needed for pain.   Yes [provider]  Amino Acids (L-CARNITINE) LIQD Take 14.8 mLs by mouth 2 (two) times daily. 1 tablespoon   Yes [provider]  budesonide-formoterol (SYMBICORT) 160-4.5 MCG/ACT inhaler Inhale 2 puffs into the lungs 2 (two) times daily. 05/26/19  Yes [provider]  dexamethasone (DECADRON) 4 MG tablet Take 8 mg by mouth daily. Take for four days after chemo 08/08/19  Yes [provider]  L-Glutamine POWD Take 10 g by mouth See admin instructions. 1 scoop in juice twice a day for 7 days after chemo.   Yes [provider]  lidocaine-prilocaine (EMLA) cream Apply  1 application topically See admin instructions. 1 hour before chemo treatment. 07/17/19  Yes [provider]  OVER THE COUNTER MEDICATION Take 1 capsule by mouth 2 (two) times daily. Ferrasorb from [place]; iron with vitamin B and C   Yes [provider]  VITAMIN D PO Take 2 capsules by mouth daily.   Yes [provider]  ondansetron (ZOFRAN ODT) 4 MG disintegrating tablet Take 1 tablet (4 mg total) by mouth every 8 (eight) hours as needed for nausea or vomiting. Patient not taking: Reported on 06/16/2019 06/02/19    Antonietta Breach, PA-C  oxyCODONE (OXY IR/ROXICODONE) 5 MG immediate release tablet Take 1 tablet (5 mg total) by mouth every 4 (four) hours as needed for severe pain. Patient not taking: Reported on 08/15/2019 06/16/19   Everitt Amber, MD  senna (SENOKOT) 8.6 MG TABS tablet Take 1 tablet (8.6 mg total) by mouth at bedtime. Patient not taking: Reported on 08/15/2019 06/16/19   Everitt Amber, MD    Inpatient Medications: Scheduled Meds:  [START ON 08/17/2019] enoxaparin (LOVENOX) injection  40 mg Subcutaneous Daily   levOCARNitine  1.5 g Oral BID   mometasone-formoterol  2 puff Inhalation BID   Continuous Infusions:  PRN Meds: acetaminophen, ondansetron (ZOFRAN) IV  Allergies:    Allergies  Allergen Reactions   Azithromycin Hives   Metformin And Related Other (See Comments)    Per pt: makes her eat   Penicillins Rash    Did it involve swelling of the face/tongue/throat, SOB, or low BP? Yes Did it involve sudden or severe rash/hives, skin peeling, or any reaction on the inside of your mouth or nose? No Did you need to seek medical attention at a hospital or doctor's office? No When did it last happen?unknown  If all above answers are NO, may proceed with cephalosporin use.;   Sulfa Antibiotics Rash    Social History:   Social History   Socioeconomic History   Marital status: Single    Spouse name: Not on file   Number of children: Not on file   Years of education: Not on file   Highest education level: Not on file  Occupational History   Not on file  Social Needs   Financial resource strain: Not on file   Food insecurity    Worry: Not on file    Inability: Not on file   Transportation needs    Medical: Not on file    Non-medical: Not on file  Tobacco Use   Smoking status: Never Smoker   Smokeless tobacco: Never Used  Substance and Sexual Activity   Alcohol use: Yes    Comment: occasionally   Drug use: Never   Sexual activity: Not Currently   Lifestyle   Physical activity    Days per week: Not on file    Minutes per session: Not on file   Stress: Not on file  Relationships   Social connections    Talks on phone: Not on file    Gets together: Not on file    Attends religious service: Not on file    Active member of club or organization: Not on file    Attends meetings of clubs or organizations: Not on file    Relationship status: Not on file   Intimate partner violence    Fear of current or ex partner: Not on file    Emotionally abused: Not on file    Physically abused: Not on file    Forced sexual activity: Not  on file  Other Topics Concern   Not on file  Social History Narrative   Not on file    Family History:    Family History  Problem Relation Age of Onset   Heart attack Father    Lung cancer Father    Throat cancer Father    Heart disease Father    Diabetes Paternal Grandfather    Heart disease Paternal Grandfather    Hypertension Paternal Grandfather    Breast cancer Neg Hx    Ovarian cancer Neg Hx    Colon cancer Neg Hx      ROS:  Please see the history of present illness.   All other ROS reviewed and negative.     Physical Exam/Data:   Vitals:   08/16/19 0245 08/16/19 0400 08/16/19 0437 08/16/19 0441  BP: 127/70   116/62  Pulse: 67 65    Resp: 14 19    Temp:    98.2 F (36.8 C)  TempSrc:    Oral  SpO2: 99% 97%  100%  Weight:   116.6 kg   Height:   5\' 10"  (1.778 m)    No intake or output data in the 24 hours ending 08/16/19 0822 Last 3 Weights 08/16/2019 08/15/2019 06/27/2019  Weight (lbs) 257 lb 263 lb 270 lb  Weight (kg) 116.574 kg 119.296 kg 122.471 kg     Body mass index is 36.88 kg/m.  General:  Well nourished, well developed, in no acute distress HEENT: normal Neck: no JVD Vascular: No carotid bruits Cardiac:  normal S1, S2; RRR; no murmur Lungs:  clear to auscultation bilaterally, no wheezing, rhonchi or rales  Abd: soft, nontender, no hepatomegaly  Ext:  trace edema Musculoskeletal:  No deformities, BUE and BLE strength normal and equal Skin: warm and dry  Neuro:  CNs 2-12 intact, no focal abnormalities noted Psych:  Normal affect   EKG:  The EKG was personally reviewed and demonstrates:  Sinus rhythm HR 63, TWI AVL Telemetry:  Telemetry was personally reviewed and demonstrates:  Sinus to sinus bradycardia, HR in the 50-60s with PACs  Relevant CV Studies:  Echo pending  Laboratory Data:  High Sensitivity Troponin:   Recent Labs  Lab 08/15/19 1811 08/15/19 2248  TROPONINIHS 27* 33*     Chemistry Recent Labs  Lab 08/15/19 1811  NA 142  K 4.4  CL 107  CO2 27  GLUCOSE 88  BUN 16  CREATININE 0.76  CALCIUM 9.4  GFRNONAA >60  GFRAA >60  ANIONGAP 8    No results for input(s): PROT, ALBUMIN, AST, ALT, ALKPHOS, BILITOT in the last 168 hours. Hematology Recent Labs  Lab 08/15/19 1811  WBC 6.3  RBC 4.48  HGB 9.6*  HCT 33.3*  MCV 74.3*  MCH 21.4*  MCHC 28.8*  RDW 24.2*  PLT 212   BNPNo results for input(s): BNP, PROBNP in the last 168 hours.  DDimer No results for input(s): DDIMER in the last 168 hours.   Radiology/Studies:  Dg Chest 2 View  Result Date: 08/15/2019 CLINICAL DATA:  Patient with tachycardia. EXAM: CHEST - 2 VIEW COMPARISON:  None. FINDINGS: Monitoring leads overlie the patient. Normal cardiac and mediastinal contours. No consolidative pulmonary opacities. No pleural effusion or pneumothorax. Thoracic spine degenerative changes. Port-A-Cath tip projects over the superior vena cava. IMPRESSION: No active cardiopulmonary disease. Electronically Signed   By: Lovey Newcomer M.D.   On: 08/15/2019 19:26    Assessment and Plan:   1. New onset atrial fibrillation with RVR -  has maintained sinus bradycardia to sinus rhythm since arrival - paclitaxel has been associated with atrial fibrillation - since she is now in sinus rhythm with some bradycardia in the 50s, I discussed using a low does BB as a  "pill-in-the-pocket" for palpitations - we had a long discussion on how to accurately take her pulse and use of 25 mg lopressor PRN - This patients CHA2DS2-VASc Score and unadjusted Ischemic Stroke Rate (% per year) is equal to 0.6 % stroke rate/year from a score of 1  - since she no longer has HTN or pre-diabetes, she has a low CHA2DS2-VASc Score of 1 and does not need anticoagulation for what seems to be PAF - TSH WNL - K 4.4 - Mg pending - if echo normal, may discharge on 25 mg lopressor PRN for palpitations and HR > 100   2. Mild lower extremity edema - she states this is new - await echo results   3. Chest pressure 4. Mildly elevated troponin - suspect this was due to her RVR, now resolved - TWI AVL, no prior EKG to compare - hs troponin 27 --> 33 --> 34 --> 35  - likely due to demand ischemia in the setting of RVR, although brief - she has a history of HTN and prediabtes, both resolved; she is a nonsmoker, but with a family history of heart disease - I will order lipids and A1c for risk factor stratification  - if no WMA on echo, will defer further ischemic evaluation at this time  I will arrange cardiology follow up.     For questions or updates, please contact [place] Please consult [email or web address] for contact info under     Signed, Ledora Bottcher, PA  08/16/2019 8:22 AM

## 2019-08-16 NOTE — ED Notes (Signed)
Report given to 6E RN. All questions answered 

## 2019-08-16 NOTE — ED Notes (Signed)
Pt ambulated to bathroom unassisted. Gait steady

## 2019-08-17 ENCOUNTER — Other Ambulatory Visit: Payer: Self-pay | Admitting: Physician Assistant

## 2019-08-17 DIAGNOSIS — C530 Malignant neoplasm of endocervix: Secondary | ICD-10-CM | POA: Diagnosis not present

## 2019-08-17 DIAGNOSIS — I4891 Unspecified atrial fibrillation: Secondary | ICD-10-CM | POA: Diagnosis not present

## 2019-08-17 DIAGNOSIS — R002 Palpitations: Secondary | ICD-10-CM

## 2019-08-17 DIAGNOSIS — Z79899 Other long term (current) drug therapy: Secondary | ICD-10-CM

## 2019-08-17 DIAGNOSIS — R072 Precordial pain: Secondary | ICD-10-CM

## 2019-08-17 DIAGNOSIS — R079 Chest pain, unspecified: Secondary | ICD-10-CM | POA: Diagnosis not present

## 2019-08-17 DIAGNOSIS — I480 Paroxysmal atrial fibrillation: Secondary | ICD-10-CM

## 2019-08-17 MED ORDER — METOPROLOL TARTRATE 25 MG PO TABS: 25.0000 mg | ORAL_TABLET | Freq: Four times a day (QID) | ORAL | 11 refills | Status: DC | PRN

## 2019-08-17 MED ORDER — MAGNESIUM SULFATE IN D5W 1-5 GM/100ML-% IV SOLN
1.0000 g | Freq: Once | INTRAVENOUS | Status: DC
  Filled 2019-08-17: qty 100

## 2019-08-17 MED ORDER — METOPROLOL SUCCINATE ER 25 MG PO TB24: 25.0000 mg | ORAL_TABLET | Freq: Every day | ORAL | 3 refills | Status: DC

## 2019-08-17 MED ORDER — MAGNESIUM OXIDE -MG SUPPLEMENT 200 MG PO TABS: 200.0000 mg | ORAL_TABLET | Freq: Two times a day (BID) | ORAL | 0 refills | Status: AC

## 2019-08-17 NOTE — Progress Notes (Signed)
In consultation with Dr. [name], will prescribe 25 mg toprol daily. She wills still have 25 mg metoprolol tartrate to take as needed for palpitations or rapid heart rate. I will also change her nuclear stress test to a CT coronary. She may take 50 mg (total) of the metoprolol tartrate prior to her CT scan.

## 2019-08-17 NOTE — Discharge Summary (Addendum)
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)   Hospital Course Summary: 53 y.o.femalewithhistory of metastatic GU cancer (cervical /ovarian : CT abd/pelvis in 06/2019 showed right ovarian 6 x10 cm complex mass with lymphadenopathy and pulm mets, transvaginal USG reported 8 x 6 cm mass) who is being treated by [facility] [place] at [facility] with chemotherapy (2 sessions so far) presented to PCP office with c/o palpitations and associated chest pressure which started after she had lunch on 9/11. Patient was found to be in A. fib with RVR. EMS was called and patient was transferred to the ER. On the way EMS gave patient metoprolol 5 mg IV and 500cc bolus normal saline with which patient's symptoms improved and she converted to sinus rhythm. Patient apparently had history of hypertension/diabetes which have resolved since patient lost 100 pounds. In the ER patient remained in sinus rhythm, chest pressure resolved,hemoglobin of 9.6 creatinine 0.76, high-sensitivity Troponins at  27 and 33,Magnesium 1.8. TSH was 1.718, COVID-19 test pending.Patient's chads 2 vasc score is only 1 at this time, she recieved apixabanx1 in the ED and admitted for further observation with cardiology consult request.  Patient evaluated by cardiology as inpatient who recommend metoprolol for prn palpitations, but no anticoagulation. Chest discomfort likely related to RVR. She underwent echocardiogram which did not show any wall motion abnormality and cardiology cleared for discharge and stated no need for ischemic w/u. Will  give 1 run of IV magnesium prior to discharge.  Discharge Exam:  Vitals:   08/17/19 0553 08/17/19 0825  BP: 116/71   Pulse: 64   Resp: 18   Temp: 98.7 F (37.1 C)   SpO2: 98% 98%   Vitals:   08/16/19 2029 08/17/19 0019 08/17/19 0553 08/17/19 0825  BP: 118/64 109/61 116/71   Pulse: 70 71 64   Resp: 18  18   Temp: 98.9 F (37.2 C) 98.5 F (36.9 C) 98.7 F (37.1 C)   TempSrc: Oral Oral Oral   SpO2: 97% 99% 98% 98%  Weight:      Height:        General: Pt is alert, awake, not in acute distress Cardiovascular: RRR, S1/S2 +, no rubs, no gallops Respiratory: CTA bilaterally, no wheezing, no rhonchi Abdominal: Soft, NT, ND, bowel sounds + Extremities: no edema, no cyanosis  Discharge Condition:Stable CODE STATUS: Full code Diet recommendation: low salt  Recommendations for Outpatient Follow-up:  1. Follow up with PCP: 5 days 2. Follow up with consultants: Cardiology A.fib clinic as scheduled , primary oncologist as scheduled 3. Please obtain follow up labs including: CBC/BMP/Mag level in 2 weeks  Home Health services upon discharge: none Equipment/Devices upon discharge:none   Discharge Instructions:  Discharge Instructions    Amb referral to AFIB Clinic   Complete by: As directed    Call MD for:  difficulty breathing, headache or visual disturbances   Complete by: As directed    Call MD for:  extreme fatigue   Complete by: As directed    Call MD for:  persistant dizziness or light-headedness   Complete by: As directed    Call MD for:  temperature >100.4  Complete by: As directed    Diet - low sodium heart healthy   Complete by: As directed    Increase activity slowly   Complete by: As directed      Allergies as of 08/17/2019      Reactions   Azithromycin Hives   Metformin And Related Other (See Comments)   Per pt: makes her eat   Penicillins Rash   Did it involve swelling of the face/tongue/throat, SOB, or low BP? Yes Did it involve sudden or severe  rash/hives, skin peeling, or any reaction on the inside of your mouth or nose? No Did you need to seek medical attention at a hospital or doctor's office? No When did it last happen?unknown  If all above answers are "NO", may proceed with cephalosporin use.;   Sulfa Antibiotics Rash      Medication List    STOP taking these medications   ondansetron 4 MG disintegrating tablet Commonly known as: Zofran ODT   oxyCODONE 5 MG immediate release tablet Commonly known as: Oxy IR/ROXICODONE   senna 8.6 MG Tabs tablet Commonly known as: SENOKOT     TAKE these medications   acetaminophen 650 MG CR tablet Commonly known as: TYLENOL Take 650 mg by mouth as needed for pain.   budesonide-formoterol 160-4.5 MCG/ACT inhaler Commonly known as: SYMBICORT Inhale 2 puffs into the lungs 2 (two) times daily.   dexamethasone 4 MG tablet Commonly known as: DECADRON Take 8 mg by mouth daily. Take for four days after chemo   L-Carnitine Liqd Take 14.8 mLs by mouth 2 (two) times daily. 1 tablespoon   L-Glutamine Powd Take 10 g by mouth See admin instructions. 1 scoop in juice twice a day for 7 days after chemo.   lidocaine-prilocaine cream Commonly known as: EMLA Apply 1 application topically See admin instructions. 1 hour before chemo treatment.   metoprolol tartrate 25 MG tablet Commonly known as: LOPRESSOR Take 1 tablet (25 mg total) by mouth every 6 (six) hours as needed (palpitations. Hold SBP<100).   OVER THE COUNTER MEDICATION Take 1 capsule by mouth 2 (two) times daily. Ferrasorb from [place]; iron with vitamin B and C   VITAMIN D PO Take 2 capsules by mouth daily.      Follow-up Information    [facility]. Call in 2 days.   Why: Call to confirm your appointment with the a-fib clinic Contact information: [place] [phone]       Sueanne Margarita, MD Follow up in 2  day(s).   Specialty: Cardiology Contact information: [id number] [place] [address] [phone]        [place] [facility] Follow up in 1 week(s).   Specialty: Cardiology Why: nuclear stress test - office will call with appt Contact information: [address] [place] [address] [phone]         Allergies  Allergen Reactions  . Azithromycin Hives  . Metformin And Related Other (See Comments)    Per pt: makes her eat  . Penicillins Rash    Did it involve swelling of the face/tongue/throat, SOB, or low BP? Yes Did it involve sudden or severe rash/hives, skin peeling, or any reaction on the inside of your mouth or nose? No Did you need to seek medical attention at a hospital or doctor's office? No When did it last happen?unknown  If all above answers are "NO",  may proceed with cephalosporin use.;  . Sulfa Antibiotics Rash      The results of significant diagnostics from this hospitalization (including imaging, microbiology, ancillary and laboratory) are listed below for reference.    Labs: BNP (last 3 results) No results for input(s): BNP in the last 8760 hours. Basic Metabolic Panel: Recent Labs  Lab 08/15/19 1811 08/16/19 1247  NA 142  --   K 4.4  --   CL 107  --   CO2 27  --   GLUCOSE 88  --   BUN 16  --   CREATININE 0.76  --   CALCIUM 9.4  --   MG  --  1.8   Liver Function Tests: No results for input(s): AST, ALT, ALKPHOS, BILITOT, PROT, ALBUMIN in the last 168 hours. No results for input(s): LIPASE, AMYLASE in the last 168 hours. No results for input(s): AMMONIA in the last 168 hours. CBC: Recent Labs  Lab 08/15/19 1811  WBC 6.3  HGB 9.6*  HCT 33.3*  MCV 74.3*  PLT 212   Cardiac Enzymes: No results for input(s): CKTOTAL, CKMB, CKMBINDEX, TROPONINI in the last 168 hours. BNP: Invalid input(s): POCBNP CBG: No results for input(s): GLUCAP in the last 168  hours. D-Dimer No results for input(s): DDIMER in the last 72 hours. Hgb A1c No results for input(s): HGBA1C in the last 72 hours. Lipid Profile Recent Labs    08/16/19 1247  CHOL 137  HDL 38*  LDLCALC 85  TRIG 72  CHOLHDL 3.6   Thyroid function studies Recent Labs    08/15/19 2306  TSH 1.718   Anemia work up No results for input(s): VITAMINB12, FOLATE, FERRITIN, TIBC, IRON, RETICCTPCT in the last 72 hours. Urinalysis    Component Value Date/Time   COLORURINE YELLOW 06/27/2019 2037   APPEARANCEUR HAZY (A) 06/27/2019 2037   LABSPEC 1.019 06/27/2019 2037   PHURINE 5.0 06/27/2019 2037   GLUCOSEU NEGATIVE 06/27/2019 2037   HGBUR SMALL (A) 06/27/2019 2037   BILIRUBINUR NEGATIVE 06/27/2019 2037   [place] NEGATIVE 06/27/2019 2037   PROTEINUR NEGATIVE 06/27/2019 2037   NITRITE NEGATIVE 06/27/2019 2037   LEUKOCYTESUR NEGATIVE 06/27/2019 2037   Sepsis Labs Invalid input(s): PROCALCITONIN,  WBC,  LACTICIDVEN Microbiology Recent Results (from the past 240 hour(s))  SARS CORONAVIRUS 2 (TAT 6-24 HRS) Nasopharyngeal Nasopharyngeal Swab     Status: None   Collection Time: 08/16/19  1:49 AM   Specimen: Nasopharyngeal Swab  Result Value Ref Range Status   SARS Coronavirus 2 NEGATIVE NEGATIVE Final    Comment: (NOTE) SARS-CoV-2 target nucleic acids are NOT DETECTED. The SARS-CoV-2 RNA is generally detectable in upper and lower respiratory specimens during the acute phase of infection. Negative results do not preclude SARS-CoV-2 infection, do not rule out co-infections with other pathogens, and should not be used as the sole basis for treatment or other patient management decisions. Negative results must be combined with clinical observations, patient history, and epidemiological information. The expected result is Negative. Fact Sheet for Patients: SugarRoll.be Fact Sheet for Healthcare Providers: https://www.woods-mathews.com/ This  test is not yet approved or cleared by the [place] FDA and  has been authorized for detection and/or diagnosis of SARS-CoV-2 by FDA under an Emergency Use Authorization (EUA). This EUA will remain  in effect (meaning this test can be used) for the duration of the COVID-19 declaration under Section 56 4(b)(1) of the Act, 21 U.S.C. section 360bbb-3(b)(1), unless the authorization is terminated or revoked sooner. Performed at [facility],  [address] [place], [place] [address]     Procedures/Studies: Dg Chest 2 View  Result Date: 08/15/2019 CLINICAL DATA:  Patient with tachycardia. EXAM: CHEST - 2 VIEW COMPARISON:  None. FINDINGS: Monitoring leads overlie the patient. Normal cardiac and mediastinal contours. No consolidative pulmonary opacities. No pleural effusion or pneumothorax. Thoracic spine degenerative changes. Port-A-Cath tip projects over the superior vena cava. IMPRESSION: No active cardiopulmonary disease. Electronically Signed   By: Lovey Newcomer M.D.   On: 08/15/2019 19:26    Time coordinating discharge: Over 30 minutes  SIGNED:   Guilford Shi, MD  [facility] 08/17/2019, 9:44 AM Pager : [phone]

## 2019-08-18 ENCOUNTER — Encounter: Payer: Self-pay | Admitting: *Deleted

## 2019-08-18 ENCOUNTER — Telehealth: Payer: Self-pay | Admitting: *Deleted

## 2019-08-18 LAB — HEMOGLOBIN A1C
Hgb A1c MFr Bld: 5.1 % (ref 4.8–5.6)
Mean Plasma Glucose: 100 mg/dL

## 2019-08-18 NOTE — Telephone Encounter (Signed)
Spoke with pt and advised we would like to do CT.  Advised I will mail a copy of instructions and to call with any questions.  Pt verbalized understanding.

## 2019-08-18 NOTE — Telephone Encounter (Signed)
-----   Message from Ledora Bottcher, [place] sent at 08/17/2019  8:16 AM EDT ----- Please arrange an OP nuclear stress test for this patient and OP follow up with Dr. [name] or an APP after the stress test.   Thanks Angie

## 2019-08-18 NOTE — Addendum Note (Signed)
Addended by: Waylan Rocher on: 08/18/2019 09:30 AM   Modules accepted: Orders

## 2019-08-18 NOTE — Progress Notes (Addendum)
Called pt she states that she is not pregnant and she will await the call from scheduling for the CT. CT letter created. Pt notified of lab and CT pre-CT directions. She will look in Mychart for these directions. She will go to the lab for bmet 2-3 days prior to scheduled CT.

## 2019-08-18 NOTE — Telephone Encounter (Signed)
She is correct. The nuclear stress test should have been canceled. She should be scheduled for a CT coronary.

## 2019-08-18 NOTE — Telephone Encounter (Signed)
Spoke with pt about scheduling nuclear stress test.  She states that she thinks this was switched to CT.  I do see Coronary CT order in Epic.  Will route to [employer], PA-C to see if both tests are needed.

## 2019-09-02 ENCOUNTER — Other Ambulatory Visit: Payer: Self-pay

## 2019-09-02 ENCOUNTER — Encounter: Payer: Self-pay | Admitting: Physician Assistant

## 2019-09-02 ENCOUNTER — Ambulatory Visit (INDEPENDENT_AMBULATORY_CARE_PROVIDER_SITE_OTHER): Payer: BC Managed Care – PPO | Admitting: Physician Assistant

## 2019-09-02 VITALS — BP 120/76 | HR 69 | Ht 70.0 in | Wt 271.4 lb

## 2019-09-02 DIAGNOSIS — R079 Chest pain, unspecified: Secondary | ICD-10-CM

## 2019-09-02 DIAGNOSIS — I480 Paroxysmal atrial fibrillation: Secondary | ICD-10-CM | POA: Diagnosis not present

## 2019-09-02 DIAGNOSIS — R1013 Epigastric pain: Secondary | ICD-10-CM | POA: Diagnosis not present

## 2019-09-02 DIAGNOSIS — R0789 Other chest pain: Secondary | ICD-10-CM

## 2019-09-02 NOTE — Progress Notes (Signed)
Cardiology Office Note    Date:  09/02/2019   ID:  Debra Ware, DOB 04-19-1966, MRN [id number]  PCP:  Patient, No Pcp Per  Cardiologist:  Dr. [name]   Chief Complaint: Hospital follow up History of Present Illness:   Debra Ware is a 53 y.o. female with hx of metastatic cervical cancer with lesions in both lungs undergoing chemotherapy, morbid obesity who presents for hospital follow up.   Prior hx of HTN and DM, both  resolved with a 100 lb weight loss in 2015.  Admitted with an episode of CP relieved with belching but then with palpitations and saw her PCP and dx with afib with RVR and sent to ER. Given IV Lopressor by EMS and converted to NSR. Her CHADS2VASC score is 1 (female) so no indication for long term anticoagulation at this time. 2D echo showed normal LVF with EF > 65% with no wall motion abnormalities. Her afib could be related to paclitaxel treatment. Unless she has frequent episodes of PAF would not consider stopping Paclitaxel at this time. Started on Toprol XL. Plan for outpatient coronary CT.   Here today for follow-up.  She denies any current episode that brought her to emergency room.  She complains of dull achy epigastric pain after eating which improves with  burping sensation.  Denies orthopnea, PND, syncope, lower extremity edema or melena.   Past Medical History:  Diagnosis Date  . Asthma   . Cancer ([place])   . Hypertension   . Metastatic cancer ([place])   . Ovarian cancer ([place])     No past surgical history on file.  Current Medications: Prior to Admission medications   Medication Sig Start Date End Date Taking? Authorizing Provider  acetaminophen (TYLENOL) 650 MG CR tablet Take 650 mg by mouth as needed for pain.    [provider]  Amino Acids (L-CARNITINE) LIQD Take 14.8 mLs by mouth 2 (two) times daily. 1 tablespoon    [provider]  budesonide-formoterol (SYMBICORT) 160-4.5 MCG/ACT inhaler Inhale 2 puffs into the lungs 2 (two)  times daily. 05/26/19   [provider]  dexamethasone (DECADRON) 4 MG tablet Take 8 mg by mouth daily. Take for four days after chemo 08/08/19   [provider]  L-Glutamine POWD Take 10 g by mouth See admin instructions. 1 scoop in juice twice a day for 7 days after chemo.    [provider]  lidocaine-prilocaine (EMLA) cream Apply 1 application topically See admin instructions. 1 hour before chemo treatment. 07/17/19   [provider]  metoprolol succinate (TOPROL XL) 25 MG 24 hr tablet Take 1 tablet (25 mg total) by mouth daily. 08/17/19 08/16/20  Ledora Bottcher, PA  metoprolol tartrate (LOPRESSOR) 25 MG tablet Take 1 tablet (25 mg total) by mouth every 6 (six) hours as needed (palpitations. Hold SBP<100). 08/17/19 09/16/19  Guilford Shi, MD  OVER THE COUNTER MEDICATION Take 1 capsule by mouth 2 (two) times daily. Ferrasorb from [place]; iron with vitamin B and C    [provider]  VITAMIN D PO Take 2 capsules by mouth daily.    [provider]    Allergies:   Azithromycin, Metformin and related, Penicillins, and Sulfa antibiotics   Social History   Socioeconomic History  . Marital status: Single    Spouse name: Not on file  . Number of children: Not on file  . Years of education: Not on file  . Highest education level: Not on file  Occupational History  .  Not on file  Social Needs  . Financial resource strain: Not on file  . Food insecurity    Worry: Not on file    Inability: Not on file  . Transportation needs    Medical: Not on file    Non-medical: Not on file  Tobacco Use  . Smoking status: Never Smoker  . Smokeless tobacco: Never Used  Substance and Sexual Activity  . Alcohol use: Yes    Comment: occasionally  . Drug use: Never  . Sexual activity: Not Currently  Lifestyle  . Physical activity    Days per week: Not on file    Minutes per session: Not on file  . Stress: Not on file  Relationships  . Social  Herbalist on phone: Not on file    Gets together: Not on file    Attends religious service: Not on file    Active member of club or organization: Not on file    Attends meetings of clubs or organizations: Not on file    Relationship status: Not on file  Other Topics Concern  . Not on file  Social History Narrative  . Not on file     Family History:  The patient's family history includes Diabetes in her paternal grandfather; Heart attack in her father; Heart disease in her father and paternal grandfather; Hypertension in her paternal grandfather; Lung cancer in her father; Throat cancer in her father.   ROS:   Please see the history of present illness.    ROS All other systems reviewed and are negative.   PHYSICAL EXAM:   VS:  BP 120/76   Pulse 69   Ht 5\' 10"  (1.778 m)   Wt 271 lb 6.4 oz (123.1 kg)   SpO2 98%   BMI 38.94 kg/m    GEN: Well nourished, well developed, in no acute distress  HEENT: normal  Neck: no JVD, carotid bruits, or masses Cardiac: RRR; no murmurs, rubs, or gallops,no edema  Respiratory:  clear to auscultation bilaterally, normal work of breathing GI: soft, nontender, nondistended, + BS MS: no deformity or atrophy  Skin: warm and dry, no rash Neuro:  Alert and Oriented x 3, Strength and sensation are intact Psych: euthymic mood, full affect  Wt Readings from Last 3 Encounters:  09/02/19 271 lb 6.4 oz (123.1 kg)  08/16/19 257 lb (116.6 kg)  06/27/19 270 lb (122.5 kg)      Studies/Labs Reviewed:   EKG:  EKG is not ordered today.    Recent Labs: 06/27/2019: ALT 16 08/15/2019: BUN 16; Creatinine, Ser 0.76; Hemoglobin 9.6; Platelets 212; Potassium 4.4; Sodium 142; TSH 1.718 08/16/2019: Magnesium 1.8   Lipid Panel    Component Value Date/Time   CHOL 137 08/16/2019 1247   TRIG 72 08/16/2019 1247   HDL 38 (L) 08/16/2019 1247   CHOLHDL 3.6 08/16/2019 1247   VLDL 14 08/16/2019 1247   [place] 85 08/16/2019 1247    Additional studies/  records that were reviewed today include:   Echocardiogram: 08/16/2019  The left ventricle has hyperdynamic systolic function, with an ejection fraction of >65%. The cavity size was normal. Left ventricular diastolic Doppler parameters are consistent with pseudonormalization. No evidence of left ventricular regional wall  motion abnormalities.  2. The right ventricle has normal systolic function. The cavity was normal. There is no increase in right ventricular wall thickness.  3. The aortic valve is tricuspid.  4. The aorta is normal unless otherwise noted.  ASSESSMENT & PLAN:    1. PAF - Converted to sinus after IV lopressor. Her CHADS2VASC score is 1 (female) so no indication for long term anticoagulation at this time. 2D echo showed normal LVF with EF > 65% with no wall motion abnormalities.  Her afib could be related to paclitaxel treatment. Unless she has frequent episodes of PAF would not consider stopping Paclitaxel at this time. Started on Toprol XL.   2. CP - Pending coronary CT. Consider ASA +/- statin based on result.   3.  Epigastric pain -Concerning for untreated GERD.  Advised take over-the-counter Nexium and follow-up with PCP.   Medication Adjustments/Labs and Tests Ordered: Current medicines are reviewed at length with the patient today.  Concerns regarding medicines are outlined above.  Medication changes, Labs and Tests ordered today are listed in the Patient Instructions below. Patient Instructions  Medication Instructions:  Your physician recommends that you continue on your current medications as directed. Please refer to the Current Medication list given to you today.  If you need a refill on your cardiac medications before your next appointment, please call your pharmacy.   Lab work: None ordered  If you have labs (blood work) drawn today and your tests are completely normal, you will receive your results only by: Marland Kitchen MyChart Message (if you have  MyChart) OR . A paper copy in the mail If you have any lab test that is abnormal or we need to change your treatment, we will call you to review the results.  Testing/Procedures: None ordered  Follow-Up: At [facility], you and your health needs are our priority.  As part of our continuing mission to provide you with exceptional heart care, we have created designated Provider Care Teams.  These Care Teams include your primary Cardiologist (physician) and Advanced Practice Providers (APPs -  Physician Assistants and Nurse Practitioners) who all work together to provide you with the care you need, when you need it. You will need a follow up appointment in 6 months.  Please call our office 2 months in advance to schedule this appointment.  You may see Fransico Him, MD or one of the following Advanced Practice Providers on your designated Care Team:   [place], PA-C Melina Copa, PA-C . Ermalinda Barrios, PA-C  Any Other Special Instructions Will Be Listed Below (If Applicable).       Jarrett Soho, [place]  09/02/2019 3:31 PM    [facility] [place], [place], [place]  [address] Phone: [phone]; Fax: [phone]

## 2019-09-02 NOTE — Patient Instructions (Signed)
Medication Instructions:  Your physician recommends that you continue on your current medications as directed. Please refer to the Current Medication list given to you today.  If you need a refill on your cardiac medications before your next appointment, please call your pharmacy.   Lab work: None ordered  If you have labs (blood work) drawn today and your tests are completely normal, you will receive your results only by: . MyChart Message (if you have MyChart) OR . A paper copy in the mail If you have any lab test that is abnormal or we need to change your treatment, we will call you to review the results.  Testing/Procedures: None ordered  Follow-Up: At [facility], you and your health needs are our priority.  As part of our continuing mission to provide you with exceptional heart care, we have created designated Provider Care Teams.  These Care Teams include your primary Cardiologist (physician) and Advanced Practice Providers (APPs -  Physician Assistants and Nurse Practitioners) who all work together to provide you with the care you need, when you need it. You will need a follow up appointment in 6 months.  Please call our office 2 months in advance to schedule this appointment.  You may see Traci [facility], MD or one of the following Advanced Practice Providers on your designated Care Team:   Brittainy Simmons, PA-C Dayna Dunn, PA-C . Michele Lenze, PA-C  Any Other Special Instructions Will Be Listed Below (If Applicable).    

## 2019-09-16 ENCOUNTER — Telehealth (HOSPITAL_COMMUNITY): Payer: Self-pay | Admitting: Emergency Medicine

## 2019-09-16 NOTE — Telephone Encounter (Signed)
Reaching out to patient to offer assistance regarding upcoming cardiac imaging study; pt verbalizes understanding of appt date/time, parking situation and where to check in, pre-test NPO status and medications ordered, and verified current allergies; name and call back number provided for further questions should they arise Breeley Bischof RN Navigator Cardiac Imaging [facility] [phone] office [phone] cell 

## 2019-09-17 ENCOUNTER — Ambulatory Visit (HOSPITAL_COMMUNITY)
Admission: RE | Admit: 2019-09-17 | Discharge: 2019-09-17 | Disposition: A | Discharge status: Home / Self Care | Payer: BC Managed Care – PPO | Source: Ambulatory Visit | Attending: Physician Assistant | Admitting: Physician Assistant

## 2019-09-17 ENCOUNTER — Other Ambulatory Visit: Payer: Self-pay

## 2019-09-17 DIAGNOSIS — R072 Precordial pain: Secondary | ICD-10-CM | POA: Insufficient documentation

## 2019-09-17 MED ORDER — IOHEXOL 350 MG/ML SOLN
80.0000 mL | Freq: Once | INTRAVENOUS | Status: AC | PRN
  Administered 2019-09-17: 80 mL via INTRAVENOUS

## 2019-09-17 MED ORDER — NITROGLYCERIN 0.4 MG SL SUBL
0.8000 mg | SUBLINGUAL_TABLET | Freq: Once | SUBLINGUAL | Status: AC
  Administered 2019-09-17: 0.8 mg via SUBLINGUAL

## 2019-09-17 MED ORDER — NITROGLYCERIN 0.4 MG SL SUBL
SUBLINGUAL_TABLET | SUBLINGUAL | Status: AC
  Filled 2019-09-17: qty 2

## 2019-09-29 ENCOUNTER — Telehealth: Payer: Self-pay | Admitting: Cardiology

## 2019-09-29 ENCOUNTER — Other Ambulatory Visit: Payer: Self-pay

## 2019-09-29 DIAGNOSIS — Z79899 Other long term (current) drug therapy: Secondary | ICD-10-CM

## 2019-09-29 NOTE — Telephone Encounter (Signed)
The patient is calling because she is being treated at the [facility] in [place].  She needs to have a CBC drawn this week and next while here in [place] to evaluate platelet count, which has been low.  She does not have an established PCP and is wondering if you would order for her.  Thank you

## 2019-09-29 NOTE — Telephone Encounter (Signed)
New Message ° ° °Patient returning your call. °

## 2019-09-29 NOTE — Telephone Encounter (Signed)
That is fine 

## 2019-09-29 NOTE — Telephone Encounter (Signed)
I will route to triage as primary card is Dr. [name].

## 2019-09-29 NOTE — Progress Notes (Signed)
Called pt to let her know that Dr. [name] is ok with ordering her CBC while she is in [place]. Pt will come in tomorrow for lab work as well as Nov 3rd for f/u CBC.

## 2019-09-29 NOTE — Telephone Encounter (Signed)
lpmtcb 10/26 

## 2019-09-29 NOTE — Telephone Encounter (Signed)
New Message  Patient is being treated by the [facility] in [place]. Patient states that she needs CBC blood work to be done this week and next week as requested by the [facility] due to platelet levels being low. Please assist with the order for blood work and give patient a call back to confirm.

## 2019-09-30 ENCOUNTER — Other Ambulatory Visit: Payer: Self-pay

## 2019-09-30 ENCOUNTER — Other Ambulatory Visit: Payer: BC Managed Care – PPO

## 2019-09-30 DIAGNOSIS — Z79899 Other long term (current) drug therapy: Secondary | ICD-10-CM

## 2019-10-01 LAB — CBC
Hematocrit: 38.2 % (ref 34.0–46.6)
Hemoglobin: 11.8 g/dL (ref 11.1–15.9)
MCH: 24.2 pg — ABNORMAL LOW (ref 26.6–33.0)
MCHC: 30.9 g/dL — ABNORMAL LOW (ref 31.5–35.7)
MCV: 78 fL — ABNORMAL LOW (ref 79–97)
Platelets: 271 10*3/uL (ref 150–450)
RBC: 4.87 x10E6/uL (ref 3.77–5.28)
RDW: 21.9 % — ABNORMAL HIGH (ref 11.7–15.4)
WBC: 6.7 10*3/uL (ref 3.4–10.8)

## 2019-10-07 ENCOUNTER — Other Ambulatory Visit: Payer: BC Managed Care – PPO

## 2019-10-07 ENCOUNTER — Other Ambulatory Visit: Payer: Self-pay

## 2019-10-07 DIAGNOSIS — Z79899 Other long term (current) drug therapy: Secondary | ICD-10-CM

## 2019-10-07 LAB — CBC
Hematocrit: 35.2 % (ref 34.0–46.6)
Hemoglobin: 11.6 g/dL (ref 11.1–15.9)
MCH: 25.1 pg — ABNORMAL LOW (ref 26.6–33.0)
MCHC: 33 g/dL (ref 31.5–35.7)
MCV: 76 fL — ABNORMAL LOW (ref 79–97)
Platelets: 164 10*3/uL (ref 150–450)
RBC: 4.62 x10E6/uL (ref 3.77–5.28)
RDW: 23.8 % — ABNORMAL HIGH (ref 11.7–15.4)
WBC: 4 10*3/uL (ref 3.4–10.8)

## 2019-10-20 ENCOUNTER — Telehealth: Payer: Self-pay

## 2019-10-20 DIAGNOSIS — D691 Qualitative platelet defects: Secondary | ICD-10-CM

## 2019-10-20 NOTE — Telephone Encounter (Signed)
I spoke to the patient and shared Dr Theodosia Blender recommendation.  She will come in on 11/17 for CBC.

## 2019-10-21 ENCOUNTER — Other Ambulatory Visit: Payer: BC Managed Care – PPO

## 2019-11-12 ENCOUNTER — Encounter: Payer: Self-pay | Admitting: *Deleted

## 2020-03-15 ENCOUNTER — Ambulatory Visit: Payer: BC Managed Care – PPO | Admitting: Cardiology

## 2020-03-30 ENCOUNTER — Other Ambulatory Visit: Payer: Self-pay

## 2020-03-30 ENCOUNTER — Ambulatory Visit: Payer: BC Managed Care – PPO | Admitting: Cardiology

## 2020-03-30 ENCOUNTER — Encounter: Payer: Self-pay | Admitting: Cardiology

## 2020-03-30 VITALS — BP 118/70 | HR 82 | Ht 70.0 in | Wt 344.0 lb

## 2020-03-30 DIAGNOSIS — R0602 Shortness of breath: Secondary | ICD-10-CM

## 2020-03-30 DIAGNOSIS — I480 Paroxysmal atrial fibrillation: Secondary | ICD-10-CM

## 2020-03-30 DIAGNOSIS — I10 Essential (primary) hypertension: Secondary | ICD-10-CM | POA: Diagnosis not present

## 2020-03-30 DIAGNOSIS — R600 Localized edema: Secondary | ICD-10-CM

## 2020-03-30 MED ORDER — FUROSEMIDE 40 MG PO TABS: 40.0000 mg | ORAL_TABLET | Freq: Two times a day (BID) | ORAL | 3 refills | Status: DC

## 2020-03-30 MED ORDER — METOPROLOL SUCCINATE ER 25 MG PO TB24: 25.0000 mg | ORAL_TABLET | Freq: Every day | ORAL | 3 refills | Status: DC

## 2020-03-30 NOTE — Addendum Note (Signed)
Addended by: Antonieta Iba on: 03/30/2020 11:27 AM   Modules accepted: Orders

## 2020-03-30 NOTE — Progress Notes (Signed)
Date:  03/30/2020   ID:  Debra Ware, DOB 06/07/1966, MRN [id number]  PCP:  Patient, No Pcp Per  Cardiologist:  Fransico Him, MD  Electrophysiologist:  None   Chief Complaint:  PAF  History of Present Illness:    Debra Ware is a 54 y.o. female who  has a hx of metastatic cervical cancer with lesions in both lungs undergoing chemotherapy, morbid obesity, HTN and DM.    Admitted 08/16/2019 with an episode of CP relieved with belching but then with palpitations and saw her PCP and dx with afib with RVR and sent to ER. Given IV Lopressor by EMS and converted to NSR. Her CHADS2VASC score is 1 (female) so no indication for long term anticoagulation at that time. 2D echo showed normal LVF with EF >65% with no wall motion abnormalities. Her afib was felt to possibly be related to paclitaxel treatment. It was recommended that unless she had frequent episodes of PAF would not consider stopping Paclitaxel. Started on Toprol XL. Outpatient coronary CT showed no CAD.  She is here today for followup and has been having a lot of problems with DOE as well as chest pressure.  She is now followed at the [facility] and was placed on steroids and unfortunately has gained over 80lbs since I saw her last.  She tells me that her SOB has gotten progressively worse and has had worsening LE pitting edema.  She is on Lasix 40mg  daily which has not really helped the swelling.    She denies any  PND, orthopnea,  dizziness,  or syncope. She has not had any further problems with palpitations or afib.  She is still on Taxol longterm.  She is compliant with her meds and is tolerating meds with no SE.    The patient does not have symptoms concerning for COVID-19 infection (fever, chills, cough, or new shortness of breath).   Prior CV studies:   The following studies were reviewed today:  Coronary CTA, 2D echo  Past Medical History:  Diagnosis Date  . Asthma   . Cancer ([place])   . Hypertension   .  Metastatic cancer ([place])   . Ovarian cancer [facility])    History reviewed. No pertinent surgical history.   Current Meds  Medication Sig  . Amino Acids (L-CARNITINE) LIQD Take 14.8 mLs by mouth 2 (two) times daily. 1 tablespoon  . dexamethasone (DECADRON) 4 MG tablet Take 8 mg by mouth daily. Take for four days after chemo  . furosemide (LASIX) 40 MG tablet Take 40 mg by mouth daily.  . L-Glutamine POWD Take 10 g by mouth See admin instructions. 1 scoop in juice twice a day for 7 days after chemo.  . lidocaine-prilocaine (EMLA) cream Apply 1 application topically See admin instructions. 1 hour before chemo treatment.  . metoprolol succinate (TOPROL XL) 25 MG 24 hr tablet Take 1 tablet (25 mg total) by mouth daily.  . metoprolol tartrate (LOPRESSOR) 25 MG tablet Take 1 tablet (25 mg total) by mouth every 6 (six) hours as needed (palpitations. Hold SBP<100).  . mometasone-formoterol (DULERA) 100-5 MCG/ACT AERO Inhale 2 puffs into the lungs 2 (two) times daily.  Marland Kitchen OVER THE COUNTER MEDICATION Take 1 capsule by mouth 2 (two) times daily. Ferrasorb from [place]; iron with vitamin B and C  . polyethylene glycol (MIRALAX / GLYCOLAX) 17 g packet Take 17 g by mouth daily.  . potassium chloride SA (KLOR-CON) 20 MEQ tablet Take 20 mEq by mouth  daily.  Marland Kitchen VITAMIN D PO Take 2 capsules by mouth daily.  . [DISCONTINUED] budesonide-formoterol (SYMBICORT) 160-4.5 MCG/ACT inhaler Inhale 2 puffs into the lungs 2 (two) times daily.  . [DISCONTINUED] metoprolol succinate (TOPROL XL) 25 MG 24 hr tablet Take 1 tablet (25 mg total) by mouth daily.     Allergies:   Azithromycin, Metformin and related, Penicillins, and Sulfa antibiotics   Social History   Tobacco Use  . Smoking status: Never Smoker  . Smokeless tobacco: Never Used  Substance Use Topics  . Alcohol use: Yes    Comment: occasionally  . Drug use: Never     Family Hx: The patient's family history includes Diabetes in her paternal grandfather; Heart  attack in her father; Heart disease in her father and paternal grandfather; Hypertension in her paternal grandfather; Lung cancer in her father; Throat cancer in her father. There is no history of Breast cancer, Ovarian cancer, or Colon cancer.  ROS:   Please see the history of present illness.     All other systems reviewed and are negative.   Labs/Other Tests and Data Reviewed:    Recent Labs: 06/27/2019: ALT 16 08/15/2019: BUN 16; Creatinine, Ser 0.76; Potassium 4.4; Sodium 142; TSH 1.718 08/16/2019: Magnesium 1.8 10/07/2019: Hemoglobin 11.6; Platelets 164   Recent Lipid Panel Lab Results  Component Value Date/Time   CHOL 137 08/16/2019 12:47 PM   TRIG 72 08/16/2019 12:47 PM   HDL 38 (L) 08/16/2019 12:47 PM   CHOLHDL 3.6 08/16/2019 12:47 PM   LDLCALC 85 08/16/2019 12:47 PM    Wt Readings from Last 3 Encounters:  03/30/20 (!) 344 lb (156 kg)  09/02/19 271 lb 6.4 oz (123.1 kg)  08/16/19 257 lb (116.6 kg)     Objective:    Vital Signs:  BP 118/70   Pulse 82   Ht 5\' 10"  (1.778 m)   Wt (!) 344 lb (156 kg)   SpO2 97%   BMI 49.36 kg/m  o CONSTITUTIONAL:  Well nourished, well developed female in no acute distress. Morbidly obese EYES: anicteric MOUTH: oral mucosa is pink RESPIRATORY: Normal respiratory effort, symmetric expansion CARDIOVASCULAR:2+ pitting edema up to her knees SKIN: No rash, lesions or ulcers MUSCULOSKELETAL: no digital cyanosis NEURO: Cranial Nerves II-XII grossly intact, moves all extremities PSYCH: Intact judgement and insight.  A&O x 3, Mood/affect appropriate   ASSESSMENT & PLAN:    1.  PAF -she had been maintaining NSR on exam -she denies any palpitations -continue Toprol XL 25mg  daily and PRN for breakthrough palpitations  2. HTN -BP controlled on exam -continue Toprol XL 25mg  daily  3.  DOE -this has become worse and likely multifactorial from 80lb weight gain on steroids as well as sedentary state. -her last echo showed normal LVF but  I think it is prudent to repeat a 2D echo since she is on chronic chemo and has had worsening SOB to make sure LVF and LV strain remain normal -given her underlying CA she is hypercoagulable so will get a VQ scan to rule out PE -she has metastatic CA to her lungs which is also likely contributing to her overall SOB  4.  Marked LE edema -likely related to steroids and morbid obesity -I have instructed her to increase lasix to 40mg  BID -check 2D echo as stated above -check LE venous dopplers to rule out DVT -followup with PA in 2 weeks and me in 2 months  COVID-19 Education: The signs and symptoms of COVID-19 were discussed with the  patient and how to seek care for testing (follow up with PCP or arrange E-visit).  The importance of social distancing was discussed today.  Patient Risk:   After full review of this patient's clinical status, I feel that they are at least moderate risk at this time.  Medication Adjustments/Labs and Tests Ordered: Current medicines are reviewed at length with the patient today.  Concerns regarding medicines are outlined above.  Tests Ordered: No orders of the defined types were placed in this encounter.  Medication Changes: Meds ordered this encounter  Medications  . metoprolol succinate (TOPROL XL) 25 MG 24 hr tablet    Sig: Take 1 tablet (25 mg total) by mouth daily.    Dispense:  90 tablet    Refill:  3    Disposition:  Follow up with PA in 2 weeks and TT in 2 months  Signed, Fransico Him, MD  03/30/2020 11:00 AM    [place]

## 2020-03-30 NOTE — Addendum Note (Signed)
Addended by: Antonieta Iba on: 03/30/2020 11:25 AM   Modules accepted: Orders

## 2020-03-30 NOTE — Patient Instructions (Addendum)
Medication Instructions:  Your physician has recommended you make the following change in your medication:  1) INCREASE Lasix (furosemide) to 40 mg twice per day  *If you need a refill on your cardiac medications before your next appointment, please call your pharmacy*   Lab Work: BMET in one week  If you have labs (blood work) drawn today and your tests are completely normal, you will receive your results only by: Marland Kitchen MyChart Message (if you have MyChart) OR . A paper copy in the mail If you have any lab test that is abnormal or we need to change your treatment, we will call you to review the results.   Testing/Procedures: Your physician has requested that you have an echocardiogram. Echocardiography is a painless test that uses sound waves to create images of your heart. It provides your doctor with information about the size and shape of your heart and how well your heart's chambers and valves are working. This procedure takes approximately one hour. There are no restrictions for this procedure.  Your physician has requested that you have a VQ scan. You will need a Chest X-ray prior to this scan.   Your physician has requested that you have a lower extremity venous duplex. This test is an ultrasound of the veins in the legs. It looks at venous blood flow that carries blood from the heart to the legs. Allow one hour for a Lower Venous exam. There are no restrictions or special instructions.   Follow-Up: At [facility], you and your health needs are our priority.  As part of our continuing mission to provide you with exceptional heart care, we have created designated Provider Care Teams.  These Care Teams include your primary Cardiologist (physician) and Advanced Practice Providers (APPs -  Physician Assistants and Nurse Practitioners) who all work together to provide you with the care you need, when you need it.   Your next appointment:   2 week(s)  The format for your next  appointment:   In Person  Provider:   Melina Copa, PA-C or Ermalinda Barrios, PA-C  Follow up with Dr. [name] in 2 months   Other Instructions  Ventilation-Perfusion Scan  A ventilation-perfusion scan is a procedure to look at airflow and blood flow in the lungs. It is most often done to look for a blood clot that may have traveled to the lungs (pulmonary embolus). During this scan, radioactive compounds are injected into the bloodstream and are also breathed in. The compounds are not harmful. They are given at very low doses and stay in the body for a short time. After the compounds are injected and breathed in, a camera is used to scan the lungs. Tell a health care provider about:  Any allergies you have.  All medicines you are taking, including vitamins, herbs, eye drops, creams, and over-the-counter medicines.  Any blood disorders you have.  Any surgeries you have had.  Any medical conditions you have.  Whether you are pregnant or may be pregnant.  Whether or not you are breastfeeding. What are the risks? Generally, this is a safe procedure. However, problems may occur, including an allergic reaction to the radioactive compounds. What happens before the procedure?  Do not use any products that contain nicotine or tobacco, such as cigarettes and e-cigarettes. If you need help quitting, ask your health care provider.  Take over-the-counter and prescription medicines only as told by your health care provider. What happens during the procedure?  An IV will be inserted  into one of your veins. The IV will stay in place for the entire exam.  A small amount of radioactive material will be injected into your bloodstream.  Your lungs will be scanned with a camera. This camera will record the images.  You will be asked to inhale a second radioactive compound. Take deep breaths as directed by the health care provider.  Your lungs will be scanned again.  Your IV will be  removed. The procedure may vary among health care providers and hospitals. What happens after the procedure?  You may go home unless your health care provider instructs you differently.  You may continue with your normal activities and diet as instructed by your health care provider.  It is up to you to get the results of your procedure. Ask your health care provider, or the department that is doing the procedure, when your results will be ready. Summary  A ventilation-perfusion scan is a scan to look at airflow and blood flow in the lungs. It is most often done to look for blood clots that may have traveled to the lungs.  The radioactive compounds are not harmful.  Your lungs will be scanned with a camera. This camera will record the images.  It is up to you to get the results of your procedure. Ask your health care provider, or the department that is doing the procedure, when your results will be ready. This information is not intended to replace advice given to you by your health care provider. Make sure you discuss any questions you have with your health care provider. Document Revised: 11/02/2017 Document Reviewed: 12/28/2016 Elsevier Patient Education  2020 [employer].

## 2020-03-31 ENCOUNTER — Ambulatory Visit (HOSPITAL_COMMUNITY)
Admission: RE | Admit: 2020-03-31 | Discharge: 2020-03-31 | Disposition: A | Discharge status: Home / Self Care | Payer: BC Managed Care – PPO | Source: Ambulatory Visit | Attending: Internal Medicine | Admitting: Internal Medicine

## 2020-03-31 DIAGNOSIS — R600 Localized edema: Secondary | ICD-10-CM | POA: Insufficient documentation

## 2020-04-06 ENCOUNTER — Other Ambulatory Visit: Payer: BC Managed Care – PPO

## 2020-04-12 ENCOUNTER — Ambulatory Visit: Payer: BC Managed Care – PPO | Admitting: Physician Assistant

## 2020-04-13 ENCOUNTER — Ambulatory Visit (HOSPITAL_COMMUNITY): Payer: BC Managed Care – PPO

## 2020-04-13 ENCOUNTER — Encounter (HOSPITAL_COMMUNITY): Payer: BC Managed Care – PPO

## 2020-04-19 ENCOUNTER — Other Ambulatory Visit: Payer: BC Managed Care – PPO

## 2020-04-20 ENCOUNTER — Other Ambulatory Visit: Payer: Self-pay | Admitting: Cardiology

## 2020-04-20 ENCOUNTER — Encounter (HOSPITAL_COMMUNITY): Payer: Self-pay | Admitting: Radiology

## 2020-04-20 ENCOUNTER — Encounter (HOSPITAL_BASED_OUTPATIENT_CLINIC_OR_DEPARTMENT_OTHER)
Admission: RE | Admit: 2020-04-20 | Discharge: 2020-04-20 | Disposition: A | Discharge status: Home / Self Care | Payer: BC Managed Care – PPO | Source: Ambulatory Visit | Attending: Cardiology | Admitting: Cardiology

## 2020-04-20 ENCOUNTER — Ambulatory Visit (HOSPITAL_COMMUNITY)
Admission: RE | Admit: 2020-04-20 | Discharge: 2020-04-20 | Disposition: A | Discharge status: Home / Self Care | Payer: BC Managed Care – PPO | Source: Ambulatory Visit | Attending: Cardiology | Admitting: Cardiology

## 2020-04-20 DIAGNOSIS — R0602 Shortness of breath: Secondary | ICD-10-CM

## 2020-04-20 DIAGNOSIS — I480 Paroxysmal atrial fibrillation: Secondary | ICD-10-CM | POA: Insufficient documentation

## 2020-04-20 DIAGNOSIS — I10 Essential (primary) hypertension: Secondary | ICD-10-CM

## 2020-04-20 MED ORDER — TECHNETIUM TO 99M ALBUMIN AGGREGATED: 1.5000 | Freq: Once | INTRAVENOUS | Status: DC | PRN

## 2020-04-21 NOTE — Progress Notes (Addendum)
CARDIOLOGY OFFICE NOTE  Date:  04/27/2020    Debra Ware Date of Birth: August 20, 1966 Medical Record [id number]  PCP:  [name], FNP  Cardiologist:  [name]   Chief Complaint  Patient presents with  . Follow-up    Seen for Dr. [name]    History of Present Illness: Debra Ware is a 54 y.o. female who presents today for a follow up visit. Seen for Dr. [name].   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  She has had PAF back in 2020. CT scan from June/July of 2020 with multiple lung nodules, pelvic adenopathy, extensive retroperitoneal adenopathy, and right ovarian mass noted.   Admitted 08/16/2019 with an episode of CP relieved with belching but then with palpitations and saw her PCP and dx with afib with RVR and sent to ER. Given IV Lopressor by EMS and converted to NSR. Her CHADS2VASC score is 1 (female) so no indication for long term anticoagulation at that time. 2D echo showed normal LVF with EF >65% with no wall motion abnormalities. Her afib was felt to possibly be related to paclitaxel treatment.It was recommended that unless she had frequent episodes of PAF would not consider stopping Paclitaxel.Started on Toprol XL. Outpatient coronary CT showed no CAD.  She was seen by Dr. [name] last month - lots of DOE and chest pressure endorsed. She had been placed on chronic steroids - had had significant weight gain of over 80 lbs. No AF noted. Echo was updated and reassuring. VQ scan with normal perfusion noted.   The patient does not have symptoms concerning for COVID-19 infection (fever, chills, cough, or new shortness of breath).   Comes in today. Here alone. She is to have labs today - BMET and BNP. She goes to [facility] every 3 weeks for chemo thru [facility]. She notes that her breathing and her swelling have improved. She is taking the additional Lasix. No  chest pain. We reviewed her studies. She is on life long chemotherapy.   Past Medical History:  Diagnosis Date  . Asthma   . Atrial fibrillation with RVR ([place])   . Cancer ([place])   . 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 Hypertension   . Metastatic cancer ([place])   . Morbid obesity with BMI of 40.0-44.9, adult ([place]) 06/16/2019  . Ovarian cancer ([place])   . PAF (paroxysmal atrial fibrillation) ([place]) 08/16/2019  . Secondary malignant neoplasm of both lungs ([place]) 06/16/2019  . 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 No pertinent surgical history.   Medications: Current Meds  Medication Sig  . albuterol (VENTOLIN HFA) 108 (90 Base) MCG/ACT inhaler   . Amino Acids (L-CARNITINE) LIQD Take 14.8 mLs by mouth 2 (two) times daily. 1 tablespoon  . budesonide-formoterol (SYMBICORT) 160-4.5 MCG/ACT inhaler SMARTSIG:2 Puff(s) By Mouth Twice Daily  . dexamethasone (DECADRON) 4 MG tablet Take 8 mg by mouth daily. Take for four days after chemo  . furosemide (LASIX) 40 MG tablet Take 1 tablet (40 mg total) by mouth 2 (two) times daily.  . L-Glutamine POWD Take 10 g by mouth See admin instructions. 1 scoop in juice twice a day for 7 days after chemo.  . lidocaine-prilocaine (EMLA) cream Apply 1 application topically See admin instructions. 1 hour before chemo treatment.  . metoprolol succinate (TOPROL  XL) 25 MG 24 hr tablet Take 1 tablet (25 mg total) by mouth daily.  Marland Kitchen OVER THE COUNTER MEDICATION Take 1 capsule by mouth 2 (two) times daily. Ferrasorb from [place]; iron with vitamin B and C  . polyethylene glycol (MIRALAX / GLYCOLAX) 17 g packet Take 17 g by mouth daily.  . potassium chloride SA (KLOR-CON) 20 MEQ tablet Take 20 mEq by mouth daily.  Marland Kitchen VITAMIN D PO Take 2 capsules by mouth daily.     Allergies: Allergies  Allergen Reactions  . Azithromycin Hives  .  Metformin And Related Other (See Comments)    Per pt: makes her eat  . Penicillins Rash    Did it involve swelling of the face/tongue/throat, SOB, or low BP? Yes Did it involve sudden or severe rash/hives, skin peeling, or any reaction on the inside of your mouth or nose? No Did you need to seek medical attention at a hospital or doctor's office? No When did it last happen?unknown  If all above answers are "NO", may proceed with cephalosporin use.;  . Sulfa Antibiotics Rash    Social History: The patient  reports that she has never smoked. She has never used smokeless tobacco. She reports current alcohol use. She reports that she does not use drugs.   Family History: The patient's family history includes Diabetes in her paternal grandfather; Heart attack in her father; Heart disease in her father and paternal grandfather; Hypertension in her paternal grandfather; Lung cancer in her father; Throat cancer in her father.   Review of Systems: Please see the history of present illness.   All other systems are reviewed and negative.   Physical Exam: VS:  BP 124/76   Pulse 64   Ht 5\' 10"  (1.778 m)   Wt (!) 337 lb 12.8 oz (153.2 kg)   SpO2 95%   BMI 48.47 kg/m  .  BMI Body mass index is 48.47 kg/m.  Wt Readings from Last 3 Encounters:  04/27/20 (!) 337 lb 12.8 oz (153.2 kg)  03/30/20 (!) 344 lb (156 kg)  09/02/19 271 lb 6.4 oz (123.1 kg)    General: Pleasant. Well developed, well nourished and in no acute distress.   HEENT: Normal.  Neck: Supple, no JVD, carotid bruits, or masses noted.  Cardiac: Regular rate and rhythm. No murmurs, rubs, or gallops. No edema.  Respiratory:  Lungs are clear to auscultation bilaterally with normal work of breathing.  GI: Soft and nontender.  MS: No deformity or atrophy. Gait and ROM intact.  Skin: Warm and dry. Color is normal.  Neuro:  Strength and sensation are intact and no gross focal deficits noted.  Psych: Alert, appropriate and with  normal affect.   LABORATORY DATA:  EKG:  EKG is not ordered today.    Lab Results  Component Value Date   WBC 4.0 10/07/2019   HGB 11.6 10/07/2019   HCT 35.2 10/07/2019   PLT 164 10/07/2019   GLUCOSE 109 (H) 04/22/2020   CHOL 137 08/16/2019   TRIG 72 08/16/2019   HDL 38 (L) 08/16/2019   LDLCALC 85 08/16/2019   ALT 16 06/27/2019   AST 39 06/27/2019   NA 138 04/22/2020   K 4.1 04/22/2020   CL 97 04/22/2020   CREATININE 0.92 04/22/2020   BUN 23 04/22/2020   CO2 23 04/22/2020   TSH 1.718 08/15/2019   HGBA1C 5.1 08/16/2019     BNP (last 3 results) No results for input(s): BNP in the last 8760 hours.  ProBNP (last 3 results) No results for input(s): PROBNP in the last 8760 hours.   Other Studies Reviewed Today:  ECHO IMPRESSIONS 04/2020  1. Left ventricular ejection fraction, by estimation, is 60 to 65%. The  left ventricle has normal function. The left ventricle has no regional  wall motion abnormalities. Left ventricular diastolic parameters are  consistent with Grade II diastolic  dysfunction (pseudonormalization). Elevated left ventricular end-diastolic  pressure.  2. Right ventricular systolic function is normal. The right ventricular  size is normal. There is normal pulmonary artery systolic pressure.  3. Left atrial size was mildly dilated.  4. The mitral valve is normal in structure. Trivial mitral valve  regurgitation. No evidence of mitral stenosis.  5. The aortic valve is tricuspid. Aortic valve regurgitation is not  visualized. No aortic stenosis is present.  6. Aortic dilatation noted. 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  7. The inferior vena cava is normal in size with greater than 50%  respiratory variability, suggesting right atrial pressure of 3 mmHg.   VQ SCAN IMPRESSION 04/2020: Normal lung perfusion.   Electronically Signed   By: Monte Fantasia M.D.   On: 04/20/2020 10:40   ASSESSMENT & PLAN:     1. Shortness of breath/lower extremity swelling - has diastolic dysfunction on echo - trying to watch her salt - has improved with additional - she also with lung mets from her cancer and what looks like extensive adenopathy in the abdominal/pelvic cavities which contributes as well. Will get her lab today. See back as planned.   2. PAF - remains in sinus by exam today.   3. HTN - BP is fine here today. Would stay on Toprol.   4. COVID-19 Education: The signs and symptoms of COVID-19 were discussed with the patient and how to seek care for testing (follow up with PCP or arrange E-visit).  The importance of social distancing, staying at home, hand hygiene and wearing a mask when out in public were discussed today.  Current medicines are reviewed with the patient today.  The patient does not have concerns regarding medicines other than what has been noted above.  The following changes have been made:  See above.  Labs/ tests ordered today include:    Orders Placed This Encounter  Procedures  . Basic metabolic panel     Disposition:   FU with Dr. [name] as planned.    Patient is agreeable to this plan and will call if any problems develop in the interim.   SignedTruitt Merle, NP  04/27/2020 12:08 PM  [facility] [address] [place], [place]  [address] Phone: [phone] Fax: [phone]       Addendum: S/w Kia from [place] @ [phone] to fax pt's ov note to Dr. Hale Drone # [phone].  Will fax today.

## 2020-04-22 ENCOUNTER — Other Ambulatory Visit: Payer: Self-pay

## 2020-04-22 ENCOUNTER — Ambulatory Visit (HOSPITAL_COMMUNITY): Payer: BC Managed Care – PPO | Attending: Cardiovascular Disease

## 2020-04-22 DIAGNOSIS — I480 Paroxysmal atrial fibrillation: Secondary | ICD-10-CM

## 2020-04-22 DIAGNOSIS — R0602 Shortness of breath: Secondary | ICD-10-CM | POA: Diagnosis not present

## 2020-04-22 DIAGNOSIS — I10 Essential (primary) hypertension: Secondary | ICD-10-CM | POA: Diagnosis not present

## 2020-04-22 LAB — BASIC METABOLIC PANEL
BUN/Creatinine Ratio: 25 — ABNORMAL HIGH (ref 9–23)
BUN: 23 mg/dL (ref 6–24)
CO2: 23 mmol/L (ref 20–29)
Calcium: 9.4 mg/dL (ref 8.7–10.2)
Chloride: 97 mmol/L (ref 96–106)
Creatinine, Ser: 0.92 mg/dL (ref 0.57–1.00)
GFR calc Af Amer: 83 mL/min/{1.73_m2} (ref >59)
GFR calc non Af Amer: 72 mL/min/{1.73_m2} (ref >59)
Glucose: 109 mg/dL — ABNORMAL HIGH (ref 65–99)
Potassium: 4.1 mmol/L (ref 3.5–5.2)
Sodium: 138 mmol/L (ref 134–144)

## 2020-04-22 MED ORDER — PERFLUTREN LIPID MICROSPHERE
1.0000 mL | INTRAVENOUS | Status: AC | PRN
  Administered 2020-04-22: 2 mL via INTRAVENOUS

## 2020-04-23 ENCOUNTER — Telehealth: Payer: Self-pay | Admitting: *Deleted

## 2020-04-23 DIAGNOSIS — R0602 Shortness of breath: Secondary | ICD-10-CM

## 2020-04-23 DIAGNOSIS — I10 Essential (primary) hypertension: Secondary | ICD-10-CM

## 2020-04-23 DIAGNOSIS — I480 Paroxysmal atrial fibrillation: Secondary | ICD-10-CM

## 2020-04-23 NOTE — Telephone Encounter (Signed)
-----   Message from Sueanne Margarita, MD sent at 04/22/2020 11:13 PM EDT ----- Normal heart function with increased stiffness of heart muscle and trivial leakiness of MV.  Please have her come in for BNP

## 2020-04-23 NOTE — Telephone Encounter (Signed)
Spoke with the pt and informed her of her echo results and recommendations per Dr. [name].  Informed the pt that Dr. [name] would like for her to have a PRO-BNP done.  Scheduled the pt to have lab work done, to check a PRO-BNP for next Tuesday 5/25, same day as she see's Remer Macho NP for follow-up. Advised the pt to come in 20-30 mins prior to her scheduled OV with Cecille Rubin on 5/25, to have her lab work done.  Pt verbalized understanding and agrees with this plan.

## 2020-04-26 ENCOUNTER — Other Ambulatory Visit: Payer: BC Managed Care – PPO

## 2020-04-27 ENCOUNTER — Telehealth: Payer: Self-pay | Admitting: *Deleted

## 2020-04-27 ENCOUNTER — Encounter: Payer: Self-pay | Admitting: Nurse Practitioner

## 2020-04-27 ENCOUNTER — Other Ambulatory Visit: Payer: BC Managed Care – PPO | Admitting: *Deleted

## 2020-04-27 ENCOUNTER — Ambulatory Visit: Payer: BC Managed Care – PPO | Admitting: Nurse Practitioner

## 2020-04-27 ENCOUNTER — Other Ambulatory Visit: Payer: Self-pay | Admitting: *Deleted

## 2020-04-27 ENCOUNTER — Other Ambulatory Visit: Payer: Self-pay

## 2020-04-27 VITALS — BP 124/76 | HR 64 | Ht 70.0 in | Wt 337.8 lb

## 2020-04-27 DIAGNOSIS — M7989 Other specified soft tissue disorders: Secondary | ICD-10-CM

## 2020-04-27 DIAGNOSIS — R600 Localized edema: Secondary | ICD-10-CM

## 2020-04-27 DIAGNOSIS — R0602 Shortness of breath: Secondary | ICD-10-CM | POA: Diagnosis not present

## 2020-04-27 DIAGNOSIS — I10 Essential (primary) hypertension: Secondary | ICD-10-CM

## 2020-04-27 DIAGNOSIS — I480 Paroxysmal atrial fibrillation: Secondary | ICD-10-CM

## 2020-04-27 NOTE — Telephone Encounter (Signed)
S/w Kia from [place] @ [phone] to fax pt's ov note to Dr. Hale Drone # [phone].  Will fax today.

## 2020-04-27 NOTE — Patient Instructions (Addendum)
After Visit Summary:  We will be checking the following labs today - BMET and BNP - Dr. [name] will let you know how these turn out.    Medication Instructions:    Continue with your current medicines.    If you need a refill on your cardiac medications before your next appointment, please call your pharmacy.     Testing/Procedures To Be Arranged:  N/A  Follow-Up:   See Dr. [name] as planned    At [facility], you and your health needs are our priority.  As part of our continuing mission to provide you with exceptional heart care, we have created designated Provider Care Teams.  These Care Teams include your primary Cardiologist (physician) and Advanced Practice Providers (APPs -  Physician Assistants and Nurse Practitioners) who all work together to provide you with the care you need, when you need it.  Special Instructions:  . Stay safe, stay home, wash your hands for at least 20 seconds and wear a mask when out in public.  . It was good to talk with you today.    Call the [place] office at [phone] if you have any questions, problems or concerns.

## 2020-04-28 LAB — BASIC METABOLIC PANEL
BUN/Creatinine Ratio: 18 (ref 9–23)
BUN: 17 mg/dL (ref 6–24)
CO2: 24 mmol/L (ref 20–29)
Calcium: 9.5 mg/dL (ref 8.7–10.2)
Chloride: 100 mmol/L (ref 96–106)
Creatinine, Ser: 0.97 mg/dL (ref 0.57–1.00)
GFR calc Af Amer: 78 mL/min/{1.73_m2} (ref >59)
GFR calc non Af Amer: 67 mL/min/{1.73_m2} (ref >59)
Glucose: 121 mg/dL — ABNORMAL HIGH (ref 65–99)
Potassium: 4.1 mmol/L (ref 3.5–5.2)
Sodium: 140 mmol/L (ref 134–144)

## 2020-04-28 LAB — PRO B NATRIURETIC PEPTIDE: NT-Pro BNP: 321 pg/mL — ABNORMAL HIGH (ref 0–249)

## 2020-05-20 ENCOUNTER — Ambulatory Visit: Payer: BC Managed Care – PPO | Admitting: Cardiology

## 2020-06-21 ENCOUNTER — Ambulatory Visit (INDEPENDENT_AMBULATORY_CARE_PROVIDER_SITE_OTHER): Payer: BC Managed Care – PPO | Admitting: Cardiology

## 2020-06-21 ENCOUNTER — Other Ambulatory Visit: Payer: Self-pay

## 2020-06-21 ENCOUNTER — Encounter (HOSPITAL_COMMUNITY): Payer: Self-pay | Admitting: Emergency Medicine

## 2020-06-21 ENCOUNTER — Inpatient Hospital Stay (HOSPITAL_COMMUNITY)
Admission: EM | Admit: 2020-06-21 | Discharge: 2020-06-26 | DRG: 308 | Disposition: A | Discharge status: Home / Self Care | Payer: BC Managed Care – PPO | Attending: Internal Medicine | Admitting: Internal Medicine

## 2020-06-21 ENCOUNTER — Emergency Department (HOSPITAL_COMMUNITY): Payer: BC Managed Care – PPO

## 2020-06-21 ENCOUNTER — Encounter: Payer: Self-pay | Admitting: Cardiology

## 2020-06-21 VITALS — BP 146/100 | HR 158 | Ht 70.0 in | Wt 344.2 lb

## 2020-06-21 DIAGNOSIS — Z888 Allergy status to other drugs, medicaments and biological substances status: Secondary | ICD-10-CM

## 2020-06-21 DIAGNOSIS — R0602 Shortness of breath: Secondary | ICD-10-CM | POA: Diagnosis not present

## 2020-06-21 DIAGNOSIS — Z8249 Family history of ischemic heart disease and other diseases of the circulatory system: Secondary | ICD-10-CM

## 2020-06-21 DIAGNOSIS — Z20822 Contact with and (suspected) exposure to covid-19: Secondary | ICD-10-CM | POA: Diagnosis present

## 2020-06-21 DIAGNOSIS — J45909 Unspecified asthma, uncomplicated: Secondary | ICD-10-CM | POA: Diagnosis present

## 2020-06-21 DIAGNOSIS — K5900 Constipation, unspecified: Secondary | ICD-10-CM | POA: Diagnosis present

## 2020-06-21 DIAGNOSIS — R600 Localized edema: Secondary | ICD-10-CM | POA: Diagnosis not present

## 2020-06-21 DIAGNOSIS — C7951 Secondary malignant neoplasm of bone: Secondary | ICD-10-CM | POA: Diagnosis present

## 2020-06-21 DIAGNOSIS — Z9221 Personal history of antineoplastic chemotherapy: Secondary | ICD-10-CM

## 2020-06-21 DIAGNOSIS — Z8543 Personal history of malignant neoplasm of ovary: Secondary | ICD-10-CM

## 2020-06-21 DIAGNOSIS — R002 Palpitations: Secondary | ICD-10-CM | POA: Diagnosis present

## 2020-06-21 DIAGNOSIS — K449 Diaphragmatic hernia without obstruction or gangrene: Secondary | ICD-10-CM | POA: Diagnosis present

## 2020-06-21 DIAGNOSIS — I5031 Acute diastolic (congestive) heart failure: Secondary | ICD-10-CM

## 2020-06-21 DIAGNOSIS — R778 Other specified abnormalities of plasma proteins: Secondary | ICD-10-CM

## 2020-06-21 DIAGNOSIS — C772 Secondary and unspecified malignant neoplasm of intra-abdominal lymph nodes: Secondary | ICD-10-CM | POA: Diagnosis present

## 2020-06-21 DIAGNOSIS — I10 Essential (primary) hypertension: Secondary | ICD-10-CM

## 2020-06-21 DIAGNOSIS — R599 Enlarged lymph nodes, unspecified: Secondary | ICD-10-CM | POA: Diagnosis present

## 2020-06-21 DIAGNOSIS — I4892 Unspecified atrial flutter: Secondary | ICD-10-CM | POA: Diagnosis present

## 2020-06-21 DIAGNOSIS — Z880 Allergy status to penicillin: Secondary | ICD-10-CM

## 2020-06-21 DIAGNOSIS — M419 Scoliosis, unspecified: Secondary | ICD-10-CM | POA: Diagnosis present

## 2020-06-21 DIAGNOSIS — I480 Paroxysmal atrial fibrillation: Secondary | ICD-10-CM | POA: Diagnosis present

## 2020-06-21 DIAGNOSIS — Z881 Allergy status to other antibiotic agents status: Secondary | ICD-10-CM

## 2020-06-21 DIAGNOSIS — C530 Malignant neoplasm of endocervix: Secondary | ICD-10-CM | POA: Diagnosis present

## 2020-06-21 DIAGNOSIS — I4891 Unspecified atrial fibrillation: Secondary | ICD-10-CM | POA: Diagnosis not present

## 2020-06-21 DIAGNOSIS — E876 Hypokalemia: Secondary | ICD-10-CM | POA: Diagnosis present

## 2020-06-21 DIAGNOSIS — C539 Malignant neoplasm of cervix uteri, unspecified: Secondary | ICD-10-CM | POA: Diagnosis present

## 2020-06-21 DIAGNOSIS — I5033 Acute on chronic diastolic (congestive) heart failure: Secondary | ICD-10-CM | POA: Diagnosis present

## 2020-06-21 DIAGNOSIS — Z6841 Body Mass Index (BMI) 40.0 and over, adult: Secondary | ICD-10-CM

## 2020-06-21 DIAGNOSIS — I483 Typical atrial flutter: Principal | ICD-10-CM | POA: Diagnosis present

## 2020-06-21 DIAGNOSIS — Z7951 Long term (current) use of inhaled steroids: Secondary | ICD-10-CM

## 2020-06-21 DIAGNOSIS — Z79899 Other long term (current) drug therapy: Secondary | ICD-10-CM

## 2020-06-21 DIAGNOSIS — I110 Hypertensive heart disease with heart failure: Secondary | ICD-10-CM | POA: Diagnosis present

## 2020-06-21 DIAGNOSIS — I493 Ventricular premature depolarization: Secondary | ICD-10-CM | POA: Diagnosis present

## 2020-06-21 DIAGNOSIS — C7801 Secondary malignant neoplasm of right lung: Secondary | ICD-10-CM | POA: Diagnosis present

## 2020-06-21 DIAGNOSIS — C7802 Secondary malignant neoplasm of left lung: Secondary | ICD-10-CM | POA: Diagnosis present

## 2020-06-21 DIAGNOSIS — I472 Ventricular tachycardia: Secondary | ICD-10-CM | POA: Diagnosis present

## 2020-06-21 DIAGNOSIS — Z882 Allergy status to sulfonamides status: Secondary | ICD-10-CM

## 2020-06-21 LAB — CBC
HCT: 42.4 % (ref 36.0–46.0)
Hemoglobin: 13.2 g/dL (ref 12.0–15.0)
MCH: 29.1 pg (ref 26.0–34.0)
MCHC: 31.1 g/dL (ref 30.0–36.0)
MCV: 93.6 fL (ref 80.0–100.0)
Platelets: UNDETERMINED 10*3/uL (ref 150–400)
RBC: 4.53 MIL/uL (ref 3.87–5.11)
RDW: 16.1 % — ABNORMAL HIGH (ref 11.5–15.5)
WBC: 8.9 10*3/uL (ref 4.0–10.5)
nRBC: 0 % (ref 0.0–0.2)

## 2020-06-21 LAB — CBC WITH DIFFERENTIAL/PLATELET
Abs Immature Granulocytes: 0.1 10*3/uL — ABNORMAL HIGH (ref 0.00–0.07)
Basophils Absolute: 0 10*3/uL (ref 0.0–0.1)
Basophils Relative: 0 %
Eosinophils Absolute: 0.1 10*3/uL (ref 0.0–0.5)
Eosinophils Relative: 1 %
HCT: 41.5 % (ref 36.0–46.0)
Hemoglobin: 12.9 g/dL (ref 12.0–15.0)
Immature Granulocytes: 1 %
Lymphocytes Relative: 31 %
Lymphs Abs: 3.1 10*3/uL (ref 0.7–4.0)
MCH: 29.1 pg (ref 26.0–34.0)
MCHC: 31.1 g/dL (ref 30.0–36.0)
MCV: 93.5 fL (ref 80.0–100.0)
Monocytes Absolute: 1.1 10*3/uL — ABNORMAL HIGH (ref 0.1–1.0)
Monocytes Relative: 11 %
Neutro Abs: 5.5 10*3/uL (ref 1.7–7.7)
Neutrophils Relative %: 56 %
Platelets: 137 10*3/uL — ABNORMAL LOW (ref 150–400)
RBC: 4.44 MIL/uL (ref 3.87–5.11)
RDW: 16.2 % — ABNORMAL HIGH (ref 11.5–15.5)
WBC: 9.9 10*3/uL (ref 4.0–10.5)
nRBC: 0 % (ref 0.0–0.2)

## 2020-06-21 LAB — BASIC METABOLIC PANEL
Anion gap: 10 (ref 5–15)
BUN: 14 mg/dL (ref 6–20)
CO2: 25 mmol/L (ref 22–32)
Calcium: 9.6 mg/dL (ref 8.9–10.3)
Chloride: 105 mmol/L (ref 98–111)
Creatinine, Ser: 0.87 mg/dL (ref 0.44–1.00)
GFR calc Af Amer: >60 mL/min (ref >60)
GFR calc non Af Amer: >60 mL/min (ref >60)
Glucose, Bld: 113 mg/dL — ABNORMAL HIGH (ref 70–99)
Potassium: 4.2 mmol/L (ref 3.5–5.1)
Sodium: 140 mmol/L (ref 135–145)

## 2020-06-21 LAB — I-STAT BETA HCG BLOOD, ED (MC, WL, AP ONLY): I-stat hCG, quantitative: <5 m[IU]/mL (ref <5)

## 2020-06-21 LAB — TROPONIN I (HIGH SENSITIVITY)
Troponin I (High Sensitivity): 75 ng/L — ABNORMAL HIGH (ref <18)
Troponin I (High Sensitivity): 68 ng/L — ABNORMAL HIGH (ref <18)

## 2020-06-21 MED ORDER — SODIUM CHLORIDE 0.9% FLUSH
3.0000 mL | Freq: Once | INTRAVENOUS | Status: AC
  Administered 2020-06-22: 3 mL via INTRAVENOUS

## 2020-06-21 NOTE — ED Triage Notes (Signed)
Pt sent by Dr. Golden Hurter to ED for further evaluation, pt when to her office for a regular check and was found to be on a flutter with a HR on the 160. Pt converted to SR pta to ED, no medications given by EMS. Pt denies any pain or SOB. BP 148/92, HR 92, R-14, SPO2 98%.

## 2020-06-21 NOTE — Progress Notes (Signed)
Date:  06/21/2020   ID:  Debra Ware, DOB 05/26/1966, MRN [id number]  PCP:  [name], FNP  Cardiologist:  Fransico Him, MD  Electrophysiologist:  None   Chief Complaint:  PAF  History of Present Illness:    Debra Ware is a 54 y.o. female who  has a hx of metastatic cervical cancer with lesions in both lungs undergoing chemotherapy, morbid obesity, HTN and DM.    Admitted 08/16/2019 with an episode of CP relieved with belching but then with palpitations and saw her PCP and dx with afib with RVR and sent to ER. Given IV Lopressor by EMS and converted to NSR. Her CHADS2VASC score is 1 (female) so no indication for long term anticoagulation at that time. 2D echo showed normal LVF with EF >65% with no wall motion abnormalities. Her afib was felt to possibly be related to paclitaxel treatment. It was recommended that unless she had frequent episodes of PAF would not consider stopping Paclitaxel. Started on Toprol XL. Outpatient coronary CT showed no CAD.  Sh was seen back by me 03/2020 and was having a lot of problems with DOE as well as chest pressure.  She is being followed at the [facility] and was placed on steroids and unfortunately had gained over 80lbs.  Her SOB had gotten progressively worse and  had worsening LE pitting edema.  Her Lasix was increased to 40mg  BID.  2D echo showed normal LVF with EF 60-65% and G2DD, mild LAE, LE venous dopplers with no DVT and VQ scan with no DVT.    She was seen back by Truitt Merle, NP 04/27/2020 and her breathing and LE edema had improved.  She is on lifelong chemo and is going to [facility] every 3 weeks for chemo through [facility].  She has lung mets from her cervical CA and extensive adenopathy in the abdominal and pelvic cavities.    She is here today for followup.  She tells me that she has noticed more LE edema recently as well as nonhealing ulcers on her legs.  She was seen in Cancer clinic today and  her BP was normal.  When she got home she took a nap and when she awakened she felt discomfort in her chest like she had gas.  She belched and the discomfort resolved but she felt her heart racing fast.  Currently she is very diaphoretic in the office with more SOB and rapid heart rate at 160bpm.    Prior CV studies:   The following studies were reviewed today:  Coronary CTA, 2D echo, LE venous US, VQ scan  Past Medical History:  Diagnosis Date  . Asthma   . Atrial fibrillation with RVR ([place])   . Cancer ([place])   . Cancer of endocervical canal ([place]) 06/16/2019  . Hypertension   . Metastatic cancer ([place])   . Morbid obesity with BMI of 40.0-44.9, adult ([place]) 06/16/2019  . Ovarian cancer ([place])   . PAF (paroxysmal atrial fibrillation) ([place]) 08/16/2019  . Secondary malignant neoplasm of both lungs ([place]) 06/16/2019  . 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 Secondary malignant neoplasm of intra-abdominal lymph nodes ([place]) 06/16/2019   No past surgical history on file.   Current Meds  Medication Sig  . albuterol (VENTOLIN HFA) 108 (90 Base) MCG/ACT inhaler   . Amino Acids (L-CARNITINE) LIQD Take 14.8 mLs by mouth 2 (two) times daily. 1 tablespoon  . budesonide-formoterol (SYMBICORT) 160-4.5 MCG/ACT  inhaler SMARTSIG:2 Puff(s) By Mouth Twice Daily  . dexamethasone (DECADRON) 4 MG tablet Take 8 mg by mouth daily. Take for four days after chemo  . furosemide (LASIX) 40 MG tablet Take 1 tablet (40 mg total) by mouth 2 (two) times daily.  . L-Glutamine POWD Take 10 g by mouth See admin instructions. 1 scoop in juice twice a day for 7 days after chemo.  . lidocaine-prilocaine (EMLA) cream Apply 1 application topically See admin instructions. 1 hour before chemo treatment.  . metoprolol succinate (TOPROL XL) 25 MG 24 hr tablet Take 1 tablet (25 mg total) by mouth daily.  . metoprolol tartrate (LOPRESSOR) 25 MG tablet Take 1 tablet (25 mg total) by mouth every 6 (six) hours as  needed (palpitations. Hold SBP<100).  Marland Kitchen OVER THE COUNTER MEDICATION Take 1 capsule by mouth 2 (two) times daily. Ferrasorb from [place]; iron with vitamin B and C  . polyethylene glycol (MIRALAX / GLYCOLAX) 17 g packet Take 17 g by mouth daily.  . potassium chloride SA (KLOR-CON) 20 MEQ tablet Take 20 mEq by mouth daily.  Marland Kitchen VITAMIN D PO Take 2 capsules by mouth daily.     Allergies:   Azithromycin, Metformin and related, Penicillins, and Sulfa antibiotics   Social History   Tobacco Use  . Smoking status: Never Smoker  . Smokeless tobacco: Never Used  Vaping Use  . Vaping Use: Never used  Substance Use Topics  . Alcohol use: Yes    Comment: occasionally  . Drug use: Never     Family Hx: The patient's family history includes Diabetes in her paternal grandfather; Heart attack in her father; Heart disease in her father and paternal grandfather; Hypertension in her paternal grandfather; Lung cancer in her father; Throat cancer in her father. There is no history of Breast cancer, Ovarian cancer, or Colon cancer.  ROS:   Please see the history of present illness.     All other systems reviewed and are negative.   Labs/Other Tests and Data Reviewed:    Recent Labs: 06/27/2019: ALT 16 08/15/2019: TSH 1.718 08/16/2019: Magnesium 1.8 10/07/2019: Hemoglobin 11.6; Platelets 164 04/27/2020: BUN 17; Creatinine, Ser 0.97; NT-Pro BNP 321; Potassium 4.1; Sodium 140   Recent Lipid Panel Lab Results  Component Value Date/Time   CHOL 137 08/16/2019 12:47 PM   TRIG 72 08/16/2019 12:47 PM   HDL 38 (L) 08/16/2019 12:47 PM   CHOLHDL 3.6 08/16/2019 12:47 PM   LDLCALC 85 08/16/2019 12:47 PM    Wt Readings from Last 3 Encounters:  06/21/20 (!) 344 lb 3.2 oz (156.1 kg)  04/27/20 (!) 337 lb 12.8 oz (153.2 kg)  03/30/20 (!) 344 lb (156 kg)     Objective:    Vital Signs:  BP (!) 146/100   Pulse (!) 158   Ht 5\' 10"  (1.778 m)   Wt (!) 344 lb 3.2 oz (156.1 kg)   BMI 49.39 kg/m  o GEN: Well  nourished, well developed in no acute distress, morbidly obese HEENT: Normal NECK: No JVD; No carotid bruits LYMPHATICS: No lymphadenopathy CARDIAC:regular and tachycardic, no murmurs, rubs, gallops RESPIRATORY:  Clear to auscultation without rales, wheezing or rhonchi  ABDOMEN: Soft, non-tender, non-distended MUSCULOSKELETAL:  3+ pitting edema; No deformity  SKIN: Warm and dry NEUROLOGIC:  Alert and oriented x 3 PSYCHIATRIC:  Normal affect    ASSESSMENT & PLAN:    1.  PAF -she thinks she had been maintaining NSR but this am after awakening from a nap she took her  Toprol and then started feeling her heart racing -EKG in office shows atrial flutter with RVR at 158bpm -she is very diaphoretic and worsening LE edema with DOE>>? If she has been in aflutter with RVR longer than today -BP is markedly elevated as well -will transport to ER by EMS for cardiology evaluation -will need IV Cardizem gtt for rate control -she has not been anticoagulated in the past as the only occurrence she had was in setting of acute illness -CHADS2VASC score is 2 (female and HTN) -start IV heparin gtt since unclear how long she has been in aflutter -may need TEE/DCCV if cannot get rate controlled  2. HTN -BP elevated on exam today at 146/15mmHg although she told me it was normal this am -continue Toprol XL 25mg  daily and titrate as needed for HR and BP control  3.  DOE -felt to be multifactorial from 80lb weight gain on steroids, sedentary lifestyle, lung mets from her cervical CA and extensive adenopathy in the abdomen but recently SOB has worsened and likely has acute diastolic CHF given worsening LE edema as well.   -unclear how long she has been in aflutter with RVR that could be contributing -her last echo 04/2020 showed normal LVF with G2DD and mild LAE -she has a hx of pericardial effusion that resolved on echo in May>>may consider repeat echo -check BNP and Cxary -will likely need IV Lasix  4.   Marked LE edema -likely related to steroids and morbid obesity and now exacerbated by aflutter with RVR -2D echo in May with normal LVF and no DVT by LE venous dopplers -check BNP, CMET, TSH -start IV lasix if renal function stable   Medication Adjustments/Labs and Tests Ordered: Current medicines are reviewed at length with the patient today.  Concerns regarding medicines are outlined above.  Tests Ordered: Orders Placed This Encounter  Procedures  . EKG 12-Lead   Medication Changes: No orders of the defined types were placed in this encounter.   Disposition:  Follow up with PA in 2 weeks and TT in 2 months  Signed, Fransico Him, MD  06/21/2020 3:51 PM    [place]

## 2020-06-22 ENCOUNTER — Emergency Department (HOSPITAL_COMMUNITY): Payer: BC Managed Care – PPO

## 2020-06-22 ENCOUNTER — Encounter (HOSPITAL_COMMUNITY): Payer: Self-pay | Admitting: Emergency Medicine

## 2020-06-22 DIAGNOSIS — I483 Typical atrial flutter: Secondary | ICD-10-CM | POA: Diagnosis present

## 2020-06-22 DIAGNOSIS — C772 Secondary and unspecified malignant neoplasm of intra-abdominal lymph nodes: Secondary | ICD-10-CM | POA: Diagnosis present

## 2020-06-22 DIAGNOSIS — E876 Hypokalemia: Secondary | ICD-10-CM | POA: Diagnosis present

## 2020-06-22 DIAGNOSIS — C530 Malignant neoplasm of endocervix: Secondary | ICD-10-CM

## 2020-06-22 DIAGNOSIS — R599 Enlarged lymph nodes, unspecified: Secondary | ICD-10-CM | POA: Diagnosis present

## 2020-06-22 DIAGNOSIS — R002 Palpitations: Secondary | ICD-10-CM | POA: Diagnosis present

## 2020-06-22 DIAGNOSIS — I5031 Acute diastolic (congestive) heart failure: Secondary | ICD-10-CM | POA: Diagnosis not present

## 2020-06-22 DIAGNOSIS — I493 Ventricular premature depolarization: Secondary | ICD-10-CM | POA: Diagnosis present

## 2020-06-22 DIAGNOSIS — M419 Scoliosis, unspecified: Secondary | ICD-10-CM | POA: Diagnosis present

## 2020-06-22 DIAGNOSIS — Z79899 Other long term (current) drug therapy: Secondary | ICD-10-CM | POA: Diagnosis not present

## 2020-06-22 DIAGNOSIS — I472 Ventricular tachycardia: Secondary | ICD-10-CM | POA: Diagnosis present

## 2020-06-22 DIAGNOSIS — I110 Hypertensive heart disease with heart failure: Secondary | ICD-10-CM | POA: Diagnosis present

## 2020-06-22 DIAGNOSIS — C7801 Secondary malignant neoplasm of right lung: Secondary | ICD-10-CM | POA: Diagnosis present

## 2020-06-22 DIAGNOSIS — Z8543 Personal history of malignant neoplasm of ovary: Secondary | ICD-10-CM | POA: Diagnosis not present

## 2020-06-22 DIAGNOSIS — J45909 Unspecified asthma, uncomplicated: Secondary | ICD-10-CM | POA: Diagnosis present

## 2020-06-22 DIAGNOSIS — C539 Malignant neoplasm of cervix uteri, unspecified: Secondary | ICD-10-CM | POA: Diagnosis present

## 2020-06-22 DIAGNOSIS — I4891 Unspecified atrial fibrillation: Secondary | ICD-10-CM | POA: Diagnosis present

## 2020-06-22 DIAGNOSIS — I4892 Unspecified atrial flutter: Secondary | ICD-10-CM

## 2020-06-22 DIAGNOSIS — Z6841 Body Mass Index (BMI) 40.0 and over, adult: Secondary | ICD-10-CM

## 2020-06-22 DIAGNOSIS — I5033 Acute on chronic diastolic (congestive) heart failure: Secondary | ICD-10-CM | POA: Diagnosis present

## 2020-06-22 DIAGNOSIS — C7802 Secondary malignant neoplasm of left lung: Secondary | ICD-10-CM | POA: Diagnosis present

## 2020-06-22 DIAGNOSIS — I10 Essential (primary) hypertension: Secondary | ICD-10-CM | POA: Diagnosis not present

## 2020-06-22 DIAGNOSIS — E242 Drug-induced Cushing's syndrome: Secondary | ICD-10-CM

## 2020-06-22 DIAGNOSIS — Z8249 Family history of ischemic heart disease and other diseases of the circulatory system: Secondary | ICD-10-CM | POA: Diagnosis not present

## 2020-06-22 DIAGNOSIS — R0609 Other forms of dyspnea: Secondary | ICD-10-CM | POA: Diagnosis not present

## 2020-06-22 DIAGNOSIS — Z20822 Contact with and (suspected) exposure to covid-19: Secondary | ICD-10-CM | POA: Diagnosis present

## 2020-06-22 DIAGNOSIS — K5900 Constipation, unspecified: Secondary | ICD-10-CM | POA: Diagnosis present

## 2020-06-22 DIAGNOSIS — I480 Paroxysmal atrial fibrillation: Secondary | ICD-10-CM | POA: Diagnosis present

## 2020-06-22 DIAGNOSIS — C7951 Secondary malignant neoplasm of bone: Secondary | ICD-10-CM | POA: Diagnosis present

## 2020-06-22 DIAGNOSIS — K449 Diaphragmatic hernia without obstruction or gangrene: Secondary | ICD-10-CM | POA: Diagnosis present

## 2020-06-22 LAB — BASIC METABOLIC PANEL
Anion gap: 11 (ref 5–15)
BUN: 13 mg/dL (ref 6–20)
CO2: 27 mmol/L (ref 22–32)
Calcium: 9 mg/dL (ref 8.9–10.3)
Chloride: 99 mmol/L (ref 98–111)
Creatinine, Ser: 0.94 mg/dL (ref 0.44–1.00)
GFR calc Af Amer: >60 mL/min (ref >60)
GFR calc non Af Amer: >60 mL/min (ref >60)
Glucose, Bld: 119 mg/dL — ABNORMAL HIGH (ref 70–99)
Potassium: 3.3 mmol/L — ABNORMAL LOW (ref 3.5–5.1)
Sodium: 137 mmol/L (ref 135–145)

## 2020-06-22 LAB — SARS CORONAVIRUS 2 BY RT PCR (HOSPITAL ORDER, PERFORMED IN ~~LOC~~ HOSPITAL LAB): SARS Coronavirus 2: NEGATIVE

## 2020-06-22 LAB — HEPATIC FUNCTION PANEL
ALT: 28 U/L (ref 0–44)
AST: 26 U/L (ref 15–41)
Albumin: 3.6 g/dL (ref 3.5–5.0)
Alkaline Phosphatase: 40 U/L (ref 38–126)
Bilirubin, Direct: 0.2 mg/dL (ref 0.0–0.2)
Indirect Bilirubin: 0.9 mg/dL (ref 0.3–0.9)
Total Bilirubin: 1.1 mg/dL (ref 0.3–1.2)
Total Protein: 6.8 g/dL (ref 6.5–8.1)

## 2020-06-22 LAB — MAGNESIUM: Magnesium: 1.8 mg/dL (ref 1.7–2.4)

## 2020-06-22 MED ORDER — HEPARIN BOLUS VIA INFUSION
4000.0000 [IU] | Freq: Once | INTRAVENOUS | Status: AC
  Administered 2020-06-22: 4000 [IU] via INTRAVENOUS
  Filled 2020-06-22: qty 4000

## 2020-06-22 MED ORDER — POTASSIUM CHLORIDE CRYS ER 20 MEQ PO TBCR
20.0000 meq | EXTENDED_RELEASE_TABLET | Freq: Two times a day (BID) | ORAL | Status: DC
  Administered 2020-06-22 – 2020-06-26 (×8): 20 meq via ORAL
  Filled 2020-06-22 (×8): qty 1

## 2020-06-22 MED ORDER — MOMETASONE FURO-FORMOTEROL FUM 200-5 MCG/ACT IN AERO
2.0000 | INHALATION_SPRAY | Freq: Two times a day (BID) | RESPIRATORY_TRACT | Status: DC
  Filled 2020-06-22: qty 8.8

## 2020-06-22 MED ORDER — MAGNESIUM OXIDE 400 (241.3 MG) MG PO TABS
400.0000 mg | ORAL_TABLET | Freq: Every day | ORAL | Status: DC
  Administered 2020-06-23 – 2020-06-26 (×4): 400 mg via ORAL
  Filled 2020-06-22 (×4): qty 1

## 2020-06-22 MED ORDER — MAGNESIUM SULFATE 2 GM/50ML IV SOLN
2.0000 g | Freq: Once | INTRAVENOUS | Status: AC
  Administered 2020-06-22: 2 g via INTRAVENOUS
  Filled 2020-06-22: qty 50

## 2020-06-22 MED ORDER — LORATADINE 10 MG PO TABS
10.0000 mg | ORAL_TABLET | Freq: Every day | ORAL | Status: DC
  Administered 2020-06-22 – 2020-06-26 (×3): 10 mg via ORAL
  Filled 2020-06-22 (×5): qty 1

## 2020-06-22 MED ORDER — ASPIRIN 81 MG PO CHEW
324.0000 mg | CHEWABLE_TABLET | Freq: Once | ORAL | Status: AC
  Administered 2020-06-22: 324 mg via ORAL
  Filled 2020-06-22: qty 4

## 2020-06-22 MED ORDER — IOHEXOL 350 MG/ML SOLN
75.0000 mL | Freq: Once | INTRAVENOUS | Status: AC | PRN
  Administered 2020-06-22: 75 mL via INTRAVENOUS

## 2020-06-22 MED ORDER — ALBUTEROL SULFATE (2.5 MG/3ML) 0.083% IN NEBU: 2.5000 mg | INHALATION_SOLUTION | RESPIRATORY_TRACT | Status: DC | PRN

## 2020-06-22 MED ORDER — ACETAMINOPHEN 650 MG RE SUPP: 650.0000 mg | Freq: Four times a day (QID) | RECTAL | Status: DC | PRN

## 2020-06-22 MED ORDER — METOPROLOL TARTRATE 25 MG PO TABS
25.0000 mg | ORAL_TABLET | Freq: Two times a day (BID) | ORAL | Status: DC
  Administered 2020-06-22 – 2020-06-26 (×9): 25 mg via ORAL
  Filled 2020-06-22 (×9): qty 1

## 2020-06-22 MED ORDER — CHLORHEXIDINE GLUCONATE CLOTH 2 % EX PADS
6.0000 | MEDICATED_PAD | Freq: Every day | CUTANEOUS | Status: DC
  Administered 2020-06-22 – 2020-06-25 (×4): 6 via TOPICAL

## 2020-06-22 MED ORDER — HYDRALAZINE HCL 20 MG/ML IJ SOLN: 10.0000 mg | Freq: Four times a day (QID) | INTRAMUSCULAR | Status: DC | PRN

## 2020-06-22 MED ORDER — POTASSIUM CHLORIDE CRYS ER 20 MEQ PO TBCR
40.0000 meq | EXTENDED_RELEASE_TABLET | Freq: Once | ORAL | Status: AC
  Administered 2020-06-22: 40 meq via ORAL
  Filled 2020-06-22: qty 2

## 2020-06-22 MED ORDER — FLUTICASONE PROPIONATE 50 MCG/ACT NA SUSP
1.0000 | Freq: Every day | NASAL | Status: DC | PRN
  Filled 2020-06-22: qty 16

## 2020-06-22 MED ORDER — FUROSEMIDE 10 MG/ML IJ SOLN
40.0000 mg | Freq: Every day | INTRAMUSCULAR | Status: DC
  Administered 2020-06-22 – 2020-06-23 (×2): 40 mg via INTRAVENOUS
  Filled 2020-06-22 (×2): qty 4

## 2020-06-22 MED ORDER — ONDANSETRON HCL 4 MG PO TABS: 4.0000 mg | ORAL_TABLET | Freq: Four times a day (QID) | ORAL | Status: DC | PRN

## 2020-06-22 MED ORDER — FUROSEMIDE 20 MG PO TABS: 40.0000 mg | ORAL_TABLET | Freq: Every day | ORAL | Status: DC

## 2020-06-22 MED ORDER — ALBUTEROL SULFATE (2.5 MG/3ML) 0.083% IN NEBU: 2.5000 mg | INHALATION_SOLUTION | Freq: Four times a day (QID) | RESPIRATORY_TRACT | Status: DC | PRN

## 2020-06-22 MED ORDER — METOPROLOL TARTRATE 25 MG PO TABS: 25.0000 mg | ORAL_TABLET | Freq: Four times a day (QID) | ORAL | Status: DC | PRN

## 2020-06-22 MED ORDER — ONDANSETRON HCL 4 MG/2ML IJ SOLN: 4.0000 mg | Freq: Four times a day (QID) | INTRAMUSCULAR | Status: DC | PRN

## 2020-06-22 MED ORDER — METOPROLOL SUCCINATE ER 25 MG PO TB24: 25.0000 mg | ORAL_TABLET | Freq: Every day | ORAL | Status: DC

## 2020-06-22 MED ORDER — HEPARIN (PORCINE) 25000 UT/250ML-% IV SOLN
1600.0000 [IU]/h | INTRAVENOUS | Status: DC
  Administered 2020-06-22: 1600 [IU]/h via INTRAVENOUS
  Filled 2020-06-22: qty 250

## 2020-06-22 MED ORDER — POLYETHYLENE GLYCOL 3350 17 G PO PACK: 17.0000 g | PACK | Freq: Every day | ORAL | Status: DC | PRN

## 2020-06-22 MED ORDER — MAGNESIUM OXIDE 400 (241.3 MG) MG PO TABS
200.0000 mg | ORAL_TABLET | Freq: Every day | ORAL | Status: DC
  Administered 2020-06-22: 200 mg via ORAL
  Filled 2020-06-22: qty 1

## 2020-06-22 MED ORDER — FUROSEMIDE 10 MG/ML IJ SOLN
40.0000 mg | Freq: Once | INTRAMUSCULAR | Status: AC
  Administered 2020-06-22: 40 mg via INTRAVENOUS
  Filled 2020-06-22: qty 4

## 2020-06-22 MED ORDER — POTASSIUM CHLORIDE CRYS ER 20 MEQ PO TBCR
20.0000 meq | EXTENDED_RELEASE_TABLET | Freq: Every day | ORAL | Status: DC
  Administered 2020-06-22: 20 meq via ORAL
  Filled 2020-06-22: qty 1

## 2020-06-22 MED ORDER — ACETAMINOPHEN 325 MG PO TABS
650.0000 mg | ORAL_TABLET | Freq: Four times a day (QID) | ORAL | Status: DC | PRN
  Administered 2020-06-23: 650 mg via ORAL
  Filled 2020-06-22: qty 2

## 2020-06-22 MED ORDER — HEPARIN (PORCINE) 25000 UT/250ML-% IV SOLN: 14.0000 [IU]/kg/h | INTRAVENOUS | Status: DC

## 2020-06-22 MED ORDER — FUROSEMIDE 10 MG/ML IJ SOLN: 40.0000 mg | Freq: Two times a day (BID) | INTRAMUSCULAR | Status: DC

## 2020-06-22 MED ORDER — POTASSIUM CHLORIDE CRYS ER 20 MEQ PO TBCR: 20.0000 meq | EXTENDED_RELEASE_TABLET | Freq: Every day | ORAL | Status: DC

## 2020-06-22 MED ORDER — APIXABAN 5 MG PO TABS
5.0000 mg | ORAL_TABLET | Freq: Two times a day (BID) | ORAL | Status: DC
  Administered 2020-06-22 – 2020-06-26 (×9): 5 mg via ORAL
  Filled 2020-06-22 (×10): qty 1

## 2020-06-22 NOTE — Progress Notes (Addendum)
Progress Note  Patient Name: Debra Ware Date of Encounter: 06/22/2020  Primary Cardiologist: Fransico Him, MD  Subjective   Breathing feels fine at rest. Maintaining NSR with PACs/PVCs. Legs remain swollen.  Inpatient Medications    Scheduled Meds: . apixaban  5 mg Oral BID  . furosemide  40 mg Intravenous Daily  . loratadine  10 mg Oral Daily  . magnesium oxide  200 mg Oral Daily  . metoprolol tartrate  25 mg Oral BID  . potassium chloride SA  20 mEq Oral Daily   Continuous Infusions:  PRN Meds: acetaminophen **OR** acetaminophen, albuterol, fluticasone, hydrALAZINE, ondansetron **OR** ondansetron (ZOFRAN) IV, polyethylene glycol   Vital Signs    Vitals:   06/22/20 0500 06/22/20 0600 06/22/20 0630 06/22/20 0700  BP: 122/62 (!) 117/57 (!) 107/59 140/74  Pulse: 67 68 (!) 42 76  Resp: 18 17 16 18   Temp:      TempSrc:      SpO2: 97% 97% 97% 98%  Weight:      Height:        Intake/Output Summary (Last 24 hours) at 06/22/2020 1155 Last data filed at 06/22/2020 0253 Gross per 24 hour  Intake --  Output 950 ml  Net -950 ml   Last 3 Weights 06/21/2020 06/21/2020 06/21/2020  Weight (lbs) 343 lb 14.7 oz 344 lb 344 lb  Weight (kg) 156 kg 156.037 kg 156.037 kg     Telemetry    NSR with occasional PACs/PVCs, 4 beats NSVT - Personally Reviewed  Physical Exam   GEN: No acute distress, BMI c/w morbid obesity HEENT: Normocephalic, atraumatic, sclera non-icteric. Neck: No JVD or bruits. Cardiac: RRR no murmurs, rubs, or gallops.  Radials/DP/PT 1+ and equal bilaterally.  Respiratory: Clear to auscultation bilaterally. Breathing is unlabored. GI: Soft, nontender, non-distended, BS +x 4. MS: no deformity. Extremities: No clubbing or cyanosis. 3+ bilateral pitting edema, covered LLE wound. Distal pedal pulses are 2+ and equal bilaterally. Neuro:  AAOx3. Follows commands. Psych:  Responds to questions appropriately with a normal affect.  Labs    High Sensitivity  Troponin:   Recent Labs  Lab 06/21/20 1710 06/21/20 2003  TROPONINIHS 75* 68*      Cardiac EnzymesNo results for input(s): TROPONINI in the last 168 hours. No results for input(s): TROPIPOC in the last 168 hours.   Chemistry Recent Labs  Lab 06/21/20 1710  NA 140  K 4.2  CL 105  CO2 25  GLUCOSE 113*  BUN 14  CREATININE 0.87  CALCIUM 9.6  GFRNONAA >60  GFRAA >60  ANIONGAP 10     Hematology Recent Labs  Lab 06/21/20 1710 06/21/20 1905  WBC 8.9 9.9  RBC 4.53 4.44  HGB 13.2 12.9  HCT 42.4 41.5  MCV 93.6 93.5  MCH 29.1 29.1  MCHC 31.1 31.1  RDW 16.1* 16.2*  PLT PLATELET CLUMPS NOTED ON SMEAR, UNABLE TO ESTIMATE 137*    BNPNo results for input(s): BNP, PROBNP in the last 168 hours.   DDimer No results for input(s): DDIMER in the last 168 hours.   Radiology    DG Chest 2 View  Result Date: 06/21/2020 CLINICAL DATA:  Chest pain EXAM: CHEST - 2 VIEW COMPARISON:  04/20/2020, CT chest 09/17/2019, 08/15/2019 FINDINGS: Left-sided central venous port tip tip over the cavoatrial region. Diffusely increased interstitial opacities. No consolidation or effusion. Stable cardiomediastinal silhouette. No pneumothorax. IMPRESSION: No significant interval change since 04/20/2020. Diffusely increased interstitial opacities which may be due to chronic change. Electronically Signed  By: Donavan Foil M.D.   On: 06/21/2020 17:42   CT Angio Chest PE W and/or Wo Contrast  Result Date: 06/22/2020 CLINICAL DATA:  Chest pain. EXAM: CT ANGIOGRAPHY CHEST WITH CONTRAST TECHNIQUE: Multidetector CT imaging of the chest was performed using the standard protocol during bolus administration of intravenous contrast. Multiplanar CT image reconstructions and MIPs were obtained to evaluate the vascular anatomy. CONTRAST:  17mL OMNIPAQUE IOHEXOL 350 MG/ML SOLN COMPARISON:  None. FINDINGS: Cardiovascular: A left-sided venous catheter is noted. Satisfactory opacification of the pulmonary arteries to the  segmental level. No evidence of pulmonary embolism. Normal heart size. No pericardial effusion. Mediastinum/Nodes: No enlarged mediastinal, hilar, or axillary lymph nodes. Thyroid gland, trachea, and esophagus demonstrate no significant findings. Lungs/Pleura: Lungs are clear. No pleural effusion or pneumothorax. Upper Abdomen: There is a small hiatal hernia. Musculoskeletal: Moderate to marked severity dextroscoliosis of the thoracic spine is seen with multilevel degenerative changes. Review of the MIP images confirms the above findings. IMPRESSION: 1. No CT evidence of pulmonary embolism. 2. Small hiatal hernia. 3. Moderate to marked severity dextroscoliosis of the thoracic spine with multilevel degenerative changes. Electronically Signed   By: Virgina Norfolk M.D.   On: 06/22/2020 03:12    Cardiac Studies   2D echo 04/2020  1. Left ventricular ejection fraction, by estimation, is 60 to 65%. The  left ventricle has normal function. The left ventricle has no regional  wall motion abnormalities. Left ventricular diastolic parameters are  consistent with Grade II diastolic  dysfunction (pseudonormalization). Elevated left ventricular end-diastolic  pressure.  2. Right ventricular systolic function is normal. The right ventricular  size is normal. There is normal pulmonary artery systolic pressure.  3. Left atrial size was mildly dilated.  4. The mitral valve is normal in structure. Trivial mitral valve  regurgitation. No evidence of mitral stenosis.  5. The aortic valve is tricuspid. Aortic valve regurgitation is not  visualized. No aortic stenosis is present.  6. Aortic dilatation noted. 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  7. The inferior vena cava is normal in size with greater than 50%  respiratory variability, suggesting right atrial pressure of 3 mmHg.   Patient Profile     54 y.o. female with history of metastatic cervical cancer on chemotherapy,  morbid obesity, HTN, DM, diastolic dysfunction, atrial fib 08/2019 initially felt r/t paclitaxel, outpatient CT 09/2019 without CAD. She had episode of CP/palpitations in 08/2019, diagnosed with AF RVR, converted to NSR with IV Lopressor. CHADSVASc was felt to be 1 and AF felt related to possibly be related to paclitaxel treatment, though it was recommended that unless she had frequent episodes, Paclitaxel was to be continued. Outpt coronary CT 09/2019 without CAD. Shewas seen back in clinic by Dr. [name] 03/2020 and washaving problems with DOE as well as chest pressure. She is beingfollowed at the [facility] and was placed on steroids and unfortunately hadgained over 80lbs. HerSOB hadgotten progressively worse and had worsening LE pitting edema. Lasix was increased. RepeatTTE showed a preserved EF of 60-65%, G2DD, and mild LAE. LE venous dopplers negative for DVT and VQ scan showed normal perfusion. . She has lung mets from her cervical CA and extensive adenopathy in the abdominal and pelvic cavities. She was seen in clinic 06/21/20 with increasing LE edema, nonhealing ulcers, diaphoresis and tachycardia and found to be in 2:1 typical atrial flutter. She spontaneously converted to NSR. Initial ED work-up notable for troponins of 75->68 and CTA  chest that was negative for PE or pulmonary disease (no metastatic disease noted). There was however moderate to severe dextroscoliosis.   Assessment & Plan    1. 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 Will give another 10meq now and increase standing KCl to 33meq BID starting this evening. If her Lasix is titrated further would benefit from increased dosing.  2. Paroxysmal atrial fib/flutter, also with PACs/PVCs and brief NSVT (4 beats) on telemetry  - now in NSR - continue Eliquis and home metoprolol - EP following with plans to start on flecainide once volume status stable (did relay 4 beat NSVT telemetry strip to them for review) - pt reports prior outpatient sleep study with mild OSA borderline for needing CPAP usage, weight management pursued - electrolyte management: potassium as above; Mg is 1.8 therefore will give 2g mag sulfate now and increase MagOx at 400mg  daily dose while on IV Lasix  3. Essential HTN  - controlled  4. Metastatic cervical cancer - [facility] [place]  5. Elevated troponin - hsTroponin 75-68 - relatively low/flat. No symptoms to suggest ACS. Recent coronary CTA was without CAD, therefore likely demand ischemia. Continue beta blocker.  For questions or updates, please contact [facility] Please consult [email or web address] for contact info under Cardiology/STEMI.  Signed, Charlie Pitter, PA-C 06/22/2020, 11:55 AM    Patient seen and examined with Melina Copa PAC.  Agree as above, with the following exceptions and changes as noted below. Gen: NAD, CV: RRR, no murmurs, Lungs: clear, Abd: soft, Extrem: Warm, well perfused, 3+ bilateral edema, Neuro/Psych: alert and oriented x 3, normal mood and affect. All available labs, radiology testing, previous records reviewed. Continue diuresis. Will have EP evaluate for AAD in setting of likely recurrences with active chemotherapy and diastolic HF.  Elouise Munroe, MD

## 2020-06-22 NOTE — Consult Note (Addendum)
ELECTROPHYSIOLOGY CONSULT NOTE    Patient ID: Debra Ware MRN: [id number], DOB/AGE: 03-13-1966 54 y.o.  Admit date: 06/21/2020 Date of Consult: 06/22/2020  Primary Physician: [name], FNP Primary Cardiologist: Fransico Him, MD  Electrophysiologist: New  Referring Provider: Dr. Margaretann Loveless  Patient Profile: Debra Ware is a 54 y.o. female with a history of metastatic cervical cancer with lesions in both lungs undergoing chemotherapy, morbid obesity, HTN, and DM who is being seen today for the evaluation of paroxysmal atrial fib and flutter at the request of Dr. Margaretann Loveless.  HPI:  Debra Ware is a 54 y.o. female with medical history above.   Admitted 08/16/2019 with an episode of CP relieved with belching but then with palpitations and saw her PCP and dx with afib with RVR and sent to ER. Given IV Lopressor by EMS and converted to NSR. Her CHADS2VASC score is 1 (female) so no indication for long term anticoagulation at that time. 2D echo showed normal LVF with EF > 65% with no wall motion abnormalities. Her afib was felt to possibly be related to paclitaxel treatment. It was recommended that unless she had frequent episodes of PAF would not consider stopping Paclitaxel. Started on Toprol XL.   She was seen back by Truitt Merle, NP 04/27/2020 and her breathing and LE edema had improved.  She is on lifelong chemo and is going to [place] every 3 weeks for chemo through [place].  She has lung mets from her cervical CA and extensive adenopathy in the abdominal and pelvic cavities.  She presented for follow up in [facility] office yesterday and on presentation was diaphoretic with SOB and AFL RVR in 150-160s by EKG (not currently available for review)  EP asked to see for recommendation on ? Rhythm control.   Echo 04/2020 LVEF 60-65%, Grade 2 DD Coronary CT (outpatient) showed no CAD  She is feeling OK currently. She was due to fly to [facility] today for further testing.  Despite chemo, lesion on her ovary has continued to grow. She has marked edema in her legs.  She gets chemo, and the take steroids for 1 week, and does this every 3 weeks. She has gained 80 lbs over the past year.   She was taking a nap prior to admission and woke up with her heart pounding.  Otherwise she has not had these similar symptoms.  Her lasix has been adjusted lately. She has been taking lasix 40 mg daily.   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 No pertinent surgical history.   (Not in a hospital admission)   Inpatient Medications:   apixaban  5 mg Oral BID   furosemide  40 mg Intravenous Daily   loratadine  10 mg Oral Daily   magnesium oxide  200 mg Oral Daily   metoprolol tartrate  25 mg Oral BID   potassium chloride SA  20 mEq Oral Daily  Allergies:  Allergies  Allergen Reactions   Azithromycin Hives   Metformin And Related Other (See Comments)    Per pt: makes her eat   Penicillins Rash    Did it involve swelling of the face/tongue/throat, SOB, or low BP? Yes Did it involve sudden or severe rash/hives, skin peeling, or any reaction on the inside of your mouth or nose? No Did you need to seek medical attention at a hospital or doctor's office? No When did it last happen?   unknown    If all above answers are "NO", may proceed with cephalosporin use.;   Sulfa Antibiotics Rash    Social History   Socioeconomic History    Marital status: Single    Spouse name: Not on file   Number of children: Not on file   Years of education: Not on file   Highest education level: Not on file  Occupational History   Not on file  Tobacco Use   Smoking status: Never Smoker   Smokeless tobacco: Never Used  Vaping Use   Vaping Use: Never used  Substance and Sexual Activity   Alcohol use: Yes    Comment: occasionally   Drug use: Never   Sexual activity: Not Currently  Other Topics Concern   Not on file  Social History Narrative   Not on file   Social Determinants of Health   Financial Resource Strain:    Difficulty of Paying Living Expenses:   Food Insecurity:    Worried About Charity fundraiser in the Last Year:    Arboriculturist in the Last Year:   Transportation Needs:    Film/video editor (Medical):    Lack of Transportation (Non-Medical):   Physical Activity:    Days of Exercise per Week:    Minutes of Exercise per Session:   Stress:    Feeling of Stress :   Social Connections:    Frequency of Communication with Friends and Family:    Frequency of Social Gatherings with Friends and Family:    Attends Religious Services:    Active Member of Clubs or Organizations:    Attends Music therapist:    Marital Status:   Intimate Partner Violence:    Fear of Current or Ex-Partner:    Emotionally Abused:    Physically Abused:    Sexually Abused:      Family History  Problem Relation Age of Onset   Heart attack Father    Lung cancer Father    Throat cancer Father    Heart disease Father    Diabetes Paternal Grandfather    Heart disease Paternal Grandfather    Hypertension Paternal Grandfather    Breast cancer Neg Hx    Ovarian cancer Neg Hx    Colon cancer Neg Hx      Review of Systems: All other systems reviewed and are otherwise negative except as noted above.  Physical Exam: Vitals:   06/22/20 0500 06/22/20 0600 06/22/20 0630 06/22/20 0700  BP: 122/62 (!) 117/57 (!)  107/59 140/74  Pulse: 67 68 (!) 42 76  Resp: 18 17 16 18   Temp:      TempSrc:      SpO2: 97% 97% 97% 98%  Weight:      Height:        GEN- The patient is well appearing, alert and oriented x 3 today.   HEENT: normocephalic, atraumatic; sclera clear, conjunctiva pink; hearing intact; oropharynx clear; neck supple Lungs- Clear  to ausculation bilaterally, normal work of breathing.  No wheezes, rales, rhonchi Heart- Regular rate and rhythm, no murmurs, rubs or gallops GI- Obese, soft, non-tender, non-distended, bowel sounds present Extremities- no clubbing or cyanosis. 3+ edema into thighs. DP/PT/radial pulses 2+ bilaterally MS- no significant deformity or atrophy Skin- warm and dry. Multiple wounds on R ankle from chronic venous stasis/edema Psych- euthymic mood, full affect Neuro- strength and sensation are intact  Labs:   Lab Results  Component Value Date   WBC 9.9 06/21/2020   HGB 12.9 06/21/2020   HCT 41.5 06/21/2020   MCV 93.5 06/21/2020   PLT 137 (L) 06/21/2020    Recent Labs  Lab 06/21/20 1710  NA 140  K 4.2  CL 105  CO2 25  BUN 14  CREATININE 0.87  CALCIUM 9.6  GLUCOSE 113*      Radiology/Studies: DG Chest 2 View  Result Date: 06/21/2020 CLINICAL DATA:  Chest pain EXAM: CHEST - 2 VIEW COMPARISON:  04/20/2020, CT chest 09/17/2019, 08/15/2019 FINDINGS: Left-sided central venous port tip tip over the cavoatrial region. Diffusely increased interstitial opacities. No consolidation or effusion. Stable cardiomediastinal silhouette. No pneumothorax. IMPRESSION: No significant interval change since 04/20/2020. Diffusely increased interstitial opacities which may be due to chronic change. Electronically Signed   By: Donavan Foil M.D.   On: 06/21/2020 17:42   CT Angio Chest PE W and/or Wo Contrast  Result Date: 06/22/2020 CLINICAL DATA:  Chest pain. EXAM: CT ANGIOGRAPHY CHEST WITH CONTRAST TECHNIQUE: Multidetector CT imaging of the chest was performed using the standard  protocol during bolus administration of intravenous contrast. Multiplanar CT image reconstructions and MIPs were obtained to evaluate the vascular anatomy. CONTRAST:  66mL OMNIPAQUE IOHEXOL 350 MG/ML SOLN COMPARISON:  None. FINDINGS: Cardiovascular: A left-sided venous catheter is noted. Satisfactory opacification of the pulmonary arteries to the segmental level. No evidence of pulmonary embolism. Normal heart size. No pericardial effusion. Mediastinum/Nodes: No enlarged mediastinal, hilar, or axillary lymph nodes. Thyroid gland, trachea, and esophagus demonstrate no significant findings. Lungs/Pleura: Lungs are clear. No pleural effusion or pneumothorax. Upper Abdomen: There is a small hiatal hernia. Musculoskeletal: Moderate to marked severity dextroscoliosis of the thoracic spine is seen with multilevel degenerative changes. Review of the MIP images confirms the above findings. IMPRESSION: 1. No CT evidence of pulmonary embolism. 2. Small hiatal hernia. 3. Moderate to marked severity dextroscoliosis of the thoracic spine with multilevel degenerative changes. Electronically Signed   By: Virgina Norfolk M.D.   On: 06/22/2020 03:12    EKG: on arrival to ED pt had spontaneously converted to NSR. EKG shows NSR at 89 bpm (personally reviewed)  TELEMETRY: NSR since arrival 60-70s (personally reviewed)  Assessment/Plan: 1.  Paroxysmal atrial fib/flutter Has at least 2 incidents, but has converted spontaneously.  Started on eliquis this admission for CHA2DS2VASC of at least 2.   (also note hypercoagulable state with metastatic CA. If we could get more edema off, I suspect this would put less stress on her heart and also help with arrhythmias.  Would need to watch electrolytes. No labs yet today.  Maysie Parkhill discuss with Dr. Curt Bears.  Toprol has been changed for lopressor 25 mg BID (from 25 mg daily) Elexia Friedt discuss utility of AAD with Dr. Curt Bears. Normal EF and no known CAD, so could be candidate for low dose  flecainide. At the same time, has had relatively seldom episodes, so may do fine with up-titration of beta blockers and further management of her other issues.   2. Stage IV Cervical  cancer Pt was scheduled to fly to [facility] today for further testing.  She has a lesion on her ovary that continues to grow despite chemo and needs to discuss next steps.    Dr. Curt Bears to see.  For questions or updates, please contact [place] Please consult [email or web address] for contact info under Cardiology/STEMI.  Signed, Shirley Friar, PA-C  06/22/2020 9:15 AM  I have seen and examined this patient with Oda Kilts.  Agree with above, note added to reflect my findings.  On exam, RRR, no murmurs, lungs clear.  Patient presented to the hospital with atrial flutter.  She was found to be volume overloaded.  She has a history of cervical cancer and is currently undergoing chemotherapy in [place] with Taxol.  She also takes steroids.  She had an echo that shows no major abnormalities and a coronary CT without evidence of coronary artery disease.  Fortunately her atrial flutter has since terminated.  She has had prior episodes of atrial flutter and similar symptoms as to this admission.  She initially thought that it was due to dyspepsia.  Once her volume is stable, would be okay to start her on flecainide 50 mg twice daily.  She can be discharged on this medication.  We Raneen Jaffer plan to see her back in clinic.  Camar Guyton M. Jolissa Kapral MD 06/22/2020 12:18 PM

## 2020-06-22 NOTE — Discharge Instructions (Signed)

## 2020-06-22 NOTE — Progress Notes (Signed)
ANTICOAGULATION CONSULT NOTE - Initial Consult  Pharmacy Consult for Heparin Indication: atrial fibrillation  Allergies  Allergen Reactions  . Azithromycin Hives  . Metformin And Related Other (See Comments)    Per pt: makes her eat  . Penicillins Rash    Did it involve swelling of the face/tongue/throat, SOB, or low BP? Yes Did it involve sudden or severe rash/hives, skin peeling, or any reaction on the inside of your mouth or nose? No Did you need to seek medical attention at a hospital or doctor's office? No When did it last happen?unknown  If all above answers are "NO", may proceed with cephalosporin use.;  . Sulfa Antibiotics Rash    Patient Measurements: Height: 5\' 10"  (177.8 cm) Weight: (!) 156 kg (343 lb 14.7 oz) IBW/kg (Calculated) : 68.5 Heparin Dosing Weight: 106 kg  Vital Signs: Temp: 98.7 F (37.1 C) (07/19 2130) Temp Source: Oral (07/19 2130) BP: 117/52 (07/20 0005) Pulse Rate: 65 (07/20 0005)  Labs: Recent Labs    06/21/20 1710 06/21/20 1905 06/21/20 2003  HGB 13.2 12.9  --   HCT 42.4 41.5  --   PLT PLATELET CLUMPS NOTED ON SMEAR, UNABLE TO ESTIMATE 137*  --   CREATININE 0.87  --   --   TROPONINIHS 75*  --  68*    Estimated Creatinine Clearance: 123.6 mL/min (by C-G formula based on SCr of 0.87 mg/dL).   Medical History: Past Medical History:  Diagnosis Date  . Asthma   . Atrial fibrillation with RVR ([place])   . Cancer ([place])   . 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 Hypertension   . Metastatic cancer ([place])   . Morbid obesity with BMI of 40.0-44.9, adult ([place]) 06/16/2019  . Ovarian cancer ([place])   . PAF (paroxysmal atrial fibrillation) ([place]) 08/16/2019  . Secondary malignant neoplasm of both lungs ([place]) 06/16/2019  . 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 F  presents from cardiology office with aflutter. To begin heparin per pharmacy. No AC PTA. H/H wnl on admission, plt slightly low at 137.  Goal of Therapy:  Heparin level 0.3-0.7 units/ml Monitor platelets by anticoagulation protocol: Yes   Plan:  Heparin IV bolus 4000 units Heparin gtt at 1600 units/hr Will f/u heparin level in 6 hours Daily heparin level and CBC  Sherlon Handing, PharmD, BCPS Please see amion for complete clinical pharmacist phone list 06/22/2020,1:12 AM

## 2020-06-22 NOTE — Progress Notes (Signed)
[place] for Eliquis dosing Indication: nonvalvular atrial fibrillation  Allergies  Allergen Reactions  . Azithromycin Hives  . Metformin And Related Other (See Comments)    Per pt: makes her eat  . Penicillins Rash    Did it involve swelling of the face/tongue/throat, SOB, or low BP? Yes Did it involve sudden or severe rash/hives, skin peeling, or any reaction on the inside of your mouth or nose? No Did you need to seek medical attention at a hospital or doctor's office? No When did it last happen?unknown  If all above answers are "NO", may proceed with cephalosporin use.;  . Sulfa Antibiotics Rash    Patient Measurements: Height: 5\' 10"  (177.8 cm) Weight: (!) 156 kg (343 lb 14.7 oz) IBW/kg (Calculated) : 68.5 Heparin Dosing Weight: 106.7  Vital Signs: Temp: 99.3 F (37.4 C) (07/20 0230) Temp Source: Oral (07/20 0230) BP: 140/74 (07/20 0700) Pulse Rate: 76 (07/20 0700)  Labs: Recent Labs    06/21/20 1710 06/21/20 1905 06/21/20 2003  HGB 13.2 12.9  --   HCT 42.4 41.5  --   PLT PLATELET CLUMPS NOTED ON SMEAR, UNABLE TO ESTIMATE 137*  --   CREATININE 0.87  --   --   TROPONINIHS 75*  --  68*    Estimated Creatinine Clearance: 123.6 mL/min (by C-G formula based on SCr of 0.87 mg/dL).   Medications:  Heparin 4000 units x1, then 1600 units/hr  Aspirin 324 mg x1   Assessment: 54 y.o. F presenting from cardiology office with aflutter and leg swelling. No AC PTA. Initially started on heparin infusion, now transitioning to Eliquis.No overt bleeding documented. H/H wnl, Plt slightly low at 137. Scr 0.87.   Goal of Therapy:  Stroke prevention Monitor platelets by anticoagulation protocol: Yes   Plan:  Stop heparin at time of first Eliquis dose (communicated plan to RN) Start Eliquis 5 mg BID Monitor CBC and s/sx of bleeding as appropriate  Claudina Lick, PharmD PGY1 [place] Resident  06/22/2020 8:33  AM  Please check [email or web address] for unit-specific pharmacy phone numbers.

## 2020-06-22 NOTE — Consult Note (Signed)
Cardiology Consult    Patient ID: Debra Ware MRN: [id number], DOB/AGE: 1966/11/16   Admit date: 06/21/2020 Date of Consult: 06/22/2020  Primary Physician: [name], FNP Primary Cardiologist: Fransico Him, MD Requesting Provider: Veatrice Kells, MD  Patient Profile    Debra Ware is a 54 y.o. female with a history of metastatic cervical cancer on chemotherapy,morbid obesity, HTN and DM. She is being seen today for the evaluation of atrial flutter and leg swelling.   History of Present Illness    Admitted 08/16/2019 with an episode of CP relieved with belching then subsequently developed palpitations and was sent to the ED by her PCP after being diagnosed with AF with RVR. Converted to NSR en route after IV Lopressor. Her CHADS2VASC score is 1 (female), so anticoagulation was not prescribed. TTE showed normal LVF with EF >65% and no wall motion abnormalities. Her AF was felt to possibly be related to paclitaxel treatment, though it was recommended that unless she had frequent episodes, Paclitaxel was to be continued.Started on Toprol XL. Outpatient coronary CT showed no CAD.  She was seen back in clinic by Dr. [name] 03/2020 and was having problems with DOE as well as chest pressure.  She is being followed at the [facility] and was placed on steroids and unfortunately had gained over 80lbs. Her SOB had gotten progressively worse and  had worsening LE pitting edema. Lasix was increased to 40mg  BID. Repeat TTE showed a preserved EF of 60-65%, G2DD, and mild LAE. LE venous dopplers negative for DVT and VQ scan showed normal perfusion.   She was then seen by Truitt Merle, NP 04/27/2020 and her breathing and LE edema had improved, though BNP was elevated to 321 (BMI 49). She has lung mets from her cervical CA and extensive adenopathy in the abdominal and pelvic cavities. She is on lifelong chemo and is going to [facility] every 3 weeks for chemo through [facility].    She was seen in clinic yesterday by Dr. [name], reporting increasing LE edema recently associated with nonhealing ulcers. She was seen in her oncology clinic prior to this appointment with normal vital signs. When she got home she took a nap and when she awakened she felt discomfort in her chest like she had gas.  She belched and the discomfort resolved but she felt her heart racing fast. She was noted by Dr. [name] to be diaphoretic with worsening dyspnea SOB and tachycardic to 160bpm with a BP of 146/100. She brings in a copy of an ECG from clinic that shows 2:1 typical atrial flutter. She was sent to the ED for further management of the atrial flutter, however subsequently converted to sinus rhythm.   She currently tells me that she feels at her baseline, legs have been swollen since before she was even diagnosed with cancer. No worsening dyspnea. Has occasional chest pressure, which was what she felt with the atrial flutter. Initial ED work-up notable for troponins of 75->68 and CTA chest that was negative for PE or pulmonary disease (no metastatic disease noted). There was however moderate to severe dextroscoliosis.   Past Medical History   Past Medical History:  Diagnosis Date  . Asthma   . Atrial fibrillation with RVR ([place])   . Cancer ([place])   . 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 Hypertension   . Metastatic cancer ([place])   . Morbid obesity with BMI of 40.0-44.9, adult ([place]) 06/16/2019  . Ovarian cancer ([place])   .  PAF (paroxysmal atrial fibrillation) ([place]) 08/16/2019  . 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 No pertinent surgical history.   Allergies  Allergen Reactions  . Azithromycin Hives  . Metformin And Related Other (See Comments)    Per pt: makes her eat  .  Penicillins Rash    Did it involve swelling of the face/tongue/throat, SOB, or low BP? Yes Did it involve sudden or severe rash/hives, skin peeling, or any reaction on the inside of your mouth or nose? No Did you need to seek medical attention at a hospital or doctor's office? No When did it last happen?unknown  If all above answers are "NO", may proceed with cephalosporin use.;  . Sulfa Antibiotics Rash   Inpatient Medications    . aspirin  324 mg Oral Once  . furosemide  40 mg Intravenous Once  . sodium chloride flush  3 mL Intravenous Once    Family History    Family History  Problem Relation Age of Onset  . Heart attack Father   . Lung cancer Father   . Throat cancer Father   . Heart disease Father   . Diabetes Paternal Grandfather   . Heart disease Paternal Grandfather   . Hypertension Paternal Grandfather   . Breast cancer Neg Hx   . Ovarian cancer Neg Hx   . Colon cancer Neg Hx    She indicated that the status of her father is unknown. She indicated that the status of her paternal grandfather is unknown. She indicated that the status of her neg hx is unknown.   Social History    Social History   Socioeconomic History  . Marital status: Single    Spouse name: Not on file  . Number of children: Not on file  . Years of education: Not on file  . Highest education level: Not on file  Occupational History  . Not on file  Tobacco Use  . Smoking status: Never Smoker  . Smokeless tobacco: Never Used  Vaping Use  . Vaping Use: Never used  Substance and Sexual Activity  . Alcohol use: Yes    Comment: occasionally  . Drug use: Never  . Sexual activity: Not Currently  Other Topics Concern  . Not on file  Social History Narrative  . Not on file   Social Determinants of Health   Financial Resource Strain:   . Difficulty of Paying Living Expenses:   Food Insecurity:   . Worried About Charity fundraiser in the Last Year:   . Arboriculturist in the Last  Year:   Transportation Needs:   . Film/video editor (Medical):   Marland Kitchen Lack of Transportation (Non-Medical):   Physical Activity:   . Days of Exercise per Week:   . Minutes of Exercise per Session:   Stress:   . Feeling of Stress :   Social Connections:   . Frequency of Communication with Friends and Family:   . Frequency of Social Gatherings with Friends and Family:   . Attends Religious Services:   . Active Member of Clubs or Organizations:   . Attends Archivist Meetings:   Marland Kitchen Marital Status:   Intimate Partner Violence:   . Fear of Current or Ex-Partner:   . Emotionally Abused:   Marland Kitchen Physically Abused:   . Sexually Abused:  Review of Systems    A comprehensive review of systems was performed with pertinent positives and negatives noted in the HPI.   Physical Exam    Blood pressure (!) 117/52, pulse 65, temperature 98.7 F (37.1 C), temperature source Oral, resp. rate 20, height 5\' 10"  (1.778 m), weight (!) 156 kg, SpO2 97 %.    No intake or output data in the 24 hours ending 06/22/20 0106 Wt Readings from Last 3 Encounters:  06/21/20 (!) 156 kg  06/21/20 (!) 156.1 kg  04/27/20 (!) 153.2 kg    CONSTITUTIONAL: alert and conversant, morbidly obese but otherwise well-appearing and in no distress HEENT: oropharynx clear and moist, no mucosal lesions, conjunctiva normal, EOM intact, pupils equal, no lid lag. CARDIOVASCULAR: Irregular rhythm with grouped beating. No gallop, murmur, or rub. Normal S1/S2. Radial pulses intact. No JVD. No carotid bruits. PULMONARY/CHEST WALL: no deformities, normal breath sounds bilaterally, normal work of breathing ABDOMINAL: soft, non-tender, non-distended EXTREMITIES: 3+ edema to thighs, covered LLE wound, warm and well-perfused NEUROLOGIC: alert, normal gait, no abnormal movements, cranial nerves grossly intact.  Labs    Troponin (Point of Care Test) No results for input(s): TROPIPOC in the last 72 hours. No results for  input(s): CKTOTAL, CKMB, TROPONINI in the last 72 hours. Lab Results  Component Value Date   WBC 9.9 06/21/2020   HGB 12.9 06/21/2020   HCT 41.5 06/21/2020   MCV 93.5 06/21/2020   PLT 137 (L) 06/21/2020    Recent Labs  Lab 06/21/20 1710  NA 140  K 4.2  CL 105  CO2 25  BUN 14  CREATININE 0.87  CALCIUM 9.6  GLUCOSE 113*   Lab Results  Component Value Date   CHOL 137 08/16/2019   HDL 38 (L) 08/16/2019   LDLCALC 85 08/16/2019   TRIG 72 08/16/2019   No results found for: [facility]   Radiology Studies    DG Chest 2 View  Result Date: 06/21/2020 CLINICAL DATA:  Chest pain EXAM: CHEST - 2 VIEW COMPARISON:  04/20/2020, CT chest 09/17/2019, 08/15/2019 FINDINGS: Left-sided central venous port tip tip over the cavoatrial region. Diffusely increased interstitial opacities. No consolidation or effusion. Stable cardiomediastinal silhouette. No pneumothorax. IMPRESSION: No significant interval change since 04/20/2020. Diffusely increased interstitial opacities which may be due to chronic change. Electronically Signed   By: Donavan Foil M.D.   On: 06/21/2020 17:42    ECG & Cardiac Imaging   TTE 04/22/20: 1. Left ventricular ejection fraction, by estimation, is 60 to 65%. The  left ventricle has normal function. The left ventricle has no regional  wall motion abnormalities. Left ventricular diastolic parameters are  consistent with Grade II diastolic  dysfunction (pseudonormalization). Elevated left ventricular end-diastolic  pressure.  2. Right ventricular systolic function is normal. The right ventricular  size is normal. There is normal pulmonary artery systolic pressure.  3. Left atrial size was mildly dilated.  4. The mitral valve is normal in structure. Trivial mitral valve  regurgitation. No evidence of mitral stenosis.  5. The aortic valve is tricuspid. Aortic valve regurgitation is not  visualized. No aortic stenosis is present.  6. Aortic dilatation noted. There is  mild dilatation of the ascending  aorta measuring 36 mm.  7. The inferior vena cava is normal in size with greater than 50%  respiratory variability, suggesting right atrial pressure of 3 mmHg.   ECG showed typical atrial flutter with 2:1 conduction - personally reviewed.  Assessment & Plan    1.  Chronic  dyspnea, edema, HFpEF TTE in May showed preserved EF with grade II DD and recent BNP was elevated. Currently hypervolemic with severe lower extremity edema, JVP difficult to quantify. Edema may be at least partially due to malignant lymphatic obstruction and severe dextroscoliosis and morbid obesity may be contributing to her dyspnea. CTA was negative for PE or effusions and there was no significant parenchymal disease, no evidence of lung mets. More acute symptoms today were likely due to atrial flutter, which has now resolved. Per clinic, notes, she has had worsening edema and dyspnea though she currently tells me both are at her baseline and chronic. - Can diurese with Lasix 40mg  IV while admitted, goal of at least 2L negative daily - Arrhythmia management as noted below  2. Paroxysmal atrial fibrillation and flutter.  One prior known AF occurrence, now typical atrial flutter (2:1), both symptomatic. Frequent atrial ectopy on monitor. I suspect burden is more significant that she is aware. It is felt that her chemotherapy is related, though she also certainly has other risk factors, most notably her obesity, though also appears hypervolemic. CHADS2VASC score is 2 (female and HTN) and she is not on anticoagulation. - Start apixaban 5mg  BID - Check TSH - Will discuss with EP in the morning to consider rhythm control - Can continue home beta blocker for now - May need to readdress risk/benefit of paclitaxel   2. HTN: Currently normotensive - Continue home meds   Signed, Marykay Lex, MD 06/22/2020, 1:06 AM  For questions or updates, please contact   Please consult [email or web address] for  contact info under Cardiology/STEMI.

## 2020-06-22 NOTE — ED Notes (Signed)
Lunch Tray Ordered @ 1045 

## 2020-06-22 NOTE — ED Provider Notes (Signed)
[facility] EMERGENCY DEPARTMENT Provider Note   CSN: [id number] Arrival date & time: 06/21/20  1637     History Chief Complaint  Patient presents with  . Atrial Flutter    Debra Ware is a 54 y.o. female.  The history is provided by the patient and medical records.  Chest Pain Pain location:  Substernal area Pain quality: pressure   Pain radiates to:  Does not radiate Pain severity:  Moderate Onset quality:  Sudden Timing:  Constant Progression:  Resolved Chronicity:  New Context: at rest   Context comment:  Awoke from sleep  Relieved by:  Nothing Worsened by:  Nothing Ineffective treatments:  None tried Associated symptoms: diaphoresis and lower extremity edema   Associated symptoms: no abdominal pain, no dizziness, no fever, no shortness of breath and no syncope   Risk factors: not female and no surgery   Patient with one previous episode of AFIB not on anticoagulation presents following transfer from cardiology for aflutter and CP with diaphoresis.  Patient does not know when she flipped into this rhythm.  She has had increased BLE edema.  No f/c/r.       Past Medical History:  Diagnosis Date  . Asthma   . Atrial fibrillation with RVR ([place])   . Cancer ([place])   . 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 Hypertension   . Metastatic cancer ([place])   . Morbid obesity with BMI of 40.0-44.9, adult ([place]) 06/16/2019  . Ovarian cancer ([place])   . PAF (paroxysmal atrial fibrillation) ([place]) 08/16/2019  . 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 Palpitations 08/17/2019  . Atrial fibrillation with RVR ([place])   . Chest pain 08/16/2019  . PAF (paroxysmal atrial fibrillation) ([place]) 08/16/2019  . 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 Morbid obesity with BMI of 40.0-44.9, adult ([place] [facility]) 06/16/2019  . 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 Cough 06/16/2019  . Pain in joint involving pelvic region and thigh 06/16/2019    History reviewed. No pertinent surgical history.   OB History   No obstetric history on file.     Family History  Problem Relation Age of Onset  . Heart attack Father   . Lung cancer Father   . Throat cancer Father   . Heart disease Father   . Diabetes Paternal Grandfather   . Heart disease Paternal Grandfather   . Hypertension Paternal Grandfather   . Breast cancer Neg Hx   . Ovarian cancer Neg Hx   . Colon cancer Neg Hx     Social History   Tobacco Use  . Smoking status: Never Smoker  . Smokeless tobacco: Never Used  Vaping Use  . Vaping Use: Never used  Substance Use Topics  . Alcohol use: Yes    Comment: occasionally  . Drug use: Never    Home Medications Prior to Admission medications   Medication Sig Start Date End Date Taking? Authorizing Provider  albuterol (VENTOLIN HFA) 108 (90 Base) MCG/ACT inhaler  04/19/20   [provider]  Amino Acids (L-CARNITINE) LIQD Take 14.8 mLs by mouth 2 (two) times daily. 1 tablespoon  [provider]  budesonide-formoterol (SYMBICORT) 160-4.5 MCG/ACT inhaler SMARTSIG:2 Puff(s) By Mouth Twice Daily 04/21/20   [provider]  dexamethasone (DECADRON) 4 MG tablet Take 8 mg by mouth daily. Take for four days after chemo 08/08/19   [provider]  furosemide (LASIX) 40 MG tablet Take 1 tablet (40 mg total) by mouth 2 (two) times daily. 03/30/20   Sueanne Margarita, MD  L-Glutamine POWD Take 10 g by mouth See admin instructions. 1 scoop in juice twice a day for 7 days after chemo.    [provider]  lidocaine-prilocaine  (EMLA) cream Apply 1 application topically See admin instructions. 1 hour before chemo treatment. 07/17/19   [provider]  metoprolol succinate (TOPROL XL) 25 MG 24 hr tablet Take 1 tablet (25 mg total) by mouth daily. 03/30/20 03/30/21  Sueanne Margarita, MD  metoprolol tartrate (LOPRESSOR) 25 MG tablet Take 1 tablet (25 mg total) by mouth every 6 (six) hours as needed (palpitations. Hold SBP<100). 08/17/19 06/21/20  Guilford Shi, MD  OVER THE COUNTER MEDICATION Take 1 capsule by mouth 2 (two) times daily. Ferrasorb from [place]; iron with vitamin B and C    [provider]  polyethylene glycol (MIRALAX / GLYCOLAX) 17 g packet Take 17 g by mouth daily.    [provider]  potassium chloride SA (KLOR-CON) 20 MEQ tablet Take 20 mEq by mouth daily.    [provider]  VITAMIN D PO Take 2 capsules by mouth daily.    [provider]    Allergies    Azithromycin, Metformin and related, Penicillins, and Sulfa antibiotics  Review of Systems   Review of Systems  Constitutional: Positive for diaphoresis. Negative for fever.  HENT: Negative for congestion.   Eyes: Negative for visual disturbance.  Respiratory: Negative for shortness of breath.   Cardiovascular: Positive for chest pain and leg swelling. Negative for syncope.  Gastrointestinal: Negative for abdominal pain.  Genitourinary: Negative for difficulty urinating.  Musculoskeletal: Negative for arthralgias.  Skin: Negative for color change.  Neurological: Negative for dizziness.  Psychiatric/Behavioral: Negative for agitation.  All other systems reviewed and are negative.   Physical Exam Updated Vital Signs BP (!) 117/52   Pulse 65   Temp 98.7 F (37.1 C) (Oral)   Resp 20   Ht 5\' 10"  (1.778 m)   Wt (!) 156 kg   SpO2 97%   BMI 49.35 kg/m   Physical Exam Vitals and nursing note reviewed.  Constitutional:      Appearance: Normal appearance. She is not diaphoretic.  HENT:     Head:  Normocephalic and atraumatic.     Nose: Nose normal.  Eyes:     Conjunctiva/sclera: Conjunctivae normal.     Pupils: Pupils are equal, round, and reactive to light.  Cardiovascular:     Rate and Rhythm: Normal rate and regular rhythm.     Pulses: Normal pulses.     Heart sounds: Normal heart sounds.  Pulmonary:     Breath sounds: No stridor. Decreased breath sounds present.  Abdominal:     General: Bowel sounds are normal.     Palpations: Abdomen is soft.     Tenderness: There is no abdominal tenderness. There is no guarding.  Musculoskeletal:     Cervical back: Normal range of motion and neck supple.     Right lower leg: Edema present.     Left lower leg: Edema present.  Skin:    General: Skin is warm.  Capillary Refill: Capillary refill takes less than 2 seconds.  Neurological:     General: No focal deficit present.     Mental Status: She is alert and oriented to person, place, and time.     Deep Tendon Reflexes: Reflexes normal.  Psychiatric:        Mood and Affect: Mood normal.        Behavior: Behavior normal.     ED Results / Procedures / Treatments   Labs (all labs ordered are listed, but only abnormal results are displayed) Results for orders placed or performed during the hospital encounter of 07/37/10  Basic metabolic panel  Result Value Ref Range   Sodium 140 135 - 145 mmol/L   Potassium 4.2 3.5 - 5.1 mmol/L   Chloride 105 98 - 111 mmol/L   CO2 25 22 - 32 mmol/L   Glucose, Bld 113 (H) 70 - 99 mg/dL   BUN 14 6 - 20 mg/dL   Creatinine, Ser 0.87 0.44 - 1.00 mg/dL   Calcium 9.6 8.9 - 10.3 mg/dL   GFR calc non Af Amer >60 >60 mL/min   GFR calc Af Amer >60 >60 mL/min   Anion gap 10 5 - 15  CBC  Result Value Ref Range   WBC 8.9 4.0 - 10.5 K/uL   RBC 4.53 3.87 - 5.11 MIL/uL   Hemoglobin 13.2 12.0 - 15.0 g/dL   HCT 42.4 36 - 46 %   MCV 93.6 80.0 - 100.0 fL   MCH 29.1 26.0 - 34.0 pg   MCHC 31.1 30.0 - 36.0 g/dL   RDW 16.1 (H) 11.5 - 15.5 %   Platelets  PLATELET CLUMPS NOTED ON SMEAR, UNABLE TO ESTIMATE 150 - 400 K/uL   nRBC 0.0 0.0 - 0.2 %  CBC with Differential  Result Value Ref Range   WBC 9.9 4.0 - 10.5 K/uL   RBC 4.44 3.87 - 5.11 MIL/uL   Hemoglobin 12.9 12.0 - 15.0 g/dL   HCT 41.5 36 - 46 %   MCV 93.5 80.0 - 100.0 fL   MCH 29.1 26.0 - 34.0 pg   MCHC 31.1 30.0 - 36.0 g/dL   RDW 16.2 (H) 11.5 - 15.5 %   Platelets 137 (L) 150 - 400 K/uL   nRBC 0.0 0.0 - 0.2 %   Neutrophils Relative % 56 %   Neutro Abs 5.5 1.7 - 7.7 K/uL   Lymphocytes Relative 31 %   Lymphs Abs 3.1 0.7 - 4.0 K/uL   Monocytes Relative 11 %   Monocytes Absolute 1.1 (H) 0 - 1 K/uL   Eosinophils Relative 1 %   Eosinophils Absolute 0.1 0 - 0 K/uL   Basophils Relative 0 %   Basophils Absolute 0.0 0 - 0 K/uL   Immature Granulocytes 1 %   Abs Immature Granulocytes 0.10 (H) 0.00 - 0.07 K/uL  I-Stat beta hCG blood, ED  Result Value Ref Range   I-stat hCG, quantitative <5.0 <5 mIU/mL   Comment 3          Troponin I (High Sensitivity)  Result Value Ref Range   Troponin I (High Sensitivity) 75 (H) <18 ng/L  Troponin I (High Sensitivity)  Result Value Ref Range   Troponin I (High Sensitivity) 68 (H) <18 ng/L   DG Chest 2 View  Result Date: 06/21/2020 CLINICAL DATA:  Chest pain EXAM: CHEST - 2 VIEW COMPARISON:  04/20/2020, CT chest 09/17/2019, 08/15/2019 FINDINGS: Left-sided central venous port tip tip over the cavoatrial region. Diffusely increased  interstitial opacities. No consolidation or effusion. Stable cardiomediastinal silhouette. No pneumothorax. IMPRESSION: No significant interval change since 04/20/2020. Diffusely increased interstitial opacities which may be due to chronic change. Electronically Signed   By: Donavan Foil M.D.   On: 06/21/2020 17:42    EKG  EKG Interpretation  Date/Time:  Monday June 21 2020 16:44:09 EDT Ventricular Rate:  89 PR Interval:  128 QRS Duration: 76 QT Interval:  352 QTC Calculation: 428 R Axis:   59 Text  Interpretation: Normal sinus rhythm Possible Left atrial enlargement ST & T wave abnormality, consider lateral ischemia Confirmed by Randal Buba, [place] Dani ([id number]) on 06/22/2020 1:04:05 AM       Radiology DG Chest 2 View  Result Date: 06/21/2020 CLINICAL DATA:  Chest pain EXAM: CHEST - 2 VIEW COMPARISON:  04/20/2020, CT chest 09/17/2019, 08/15/2019 FINDINGS: Left-sided central venous port tip tip over the cavoatrial region. Diffusely increased interstitial opacities. No consolidation or effusion. Stable cardiomediastinal silhouette. No pneumothorax. IMPRESSION: No significant interval change since 04/20/2020. Diffusely increased interstitial opacities which may be due to chronic change. Electronically Signed   By: Donavan Foil M.D.   On: 06/21/2020 17:42    Procedures Procedures (including critical care time)  Medications Ordered in ED Medications  sodium chloride flush (NS) 0.9 % injection 3 mL (has no administration in time range)  aspirin chewable tablet 324 mg (has no administration in time range)  furosemide (LASIX) injection 40 mg (has no administration in time range)  heparin ADULT infusion 100 units/mL (25000 units/268mL sodium chloride 0.45%) (has no administration in time range)    ED Course  I have reviewed the triage vital signs and the nursing notes.  Pertinent labs & imaging results that were available during my care of the patient were reviewed by me and considered in my medical decision making (see chart for details).  Have started IV lasix per cardiology note and have consulted cardiology as well as heparin   MDM Reviewed: nursing note, vitals and previous chart Interpretation: labs, ECG and x-ray (elevated troponin.  No PNA by me on CXR) Total time providing critical care: 30-74 minutes (heparin drip initiated ). This excludes time spent performing separately reportable procedures and services. Consults: cardiology   CRITICAL CARE Performed by: Khristian Phillippi K  Jaselyn Nahm-Rasch Total critical care time: 45 minutes Critical care time was exclusive of separately billable procedures and treating other patients. Critical care was necessary to treat or prevent imminent or life-threatening deterioration. Critical care was time spent personally by me on the following activities: development of treatment plan with patient and/or surrogate as well as nursing, discussions with consultants, evaluation of patient's response to treatment, examination of patient, obtaining history from patient or surrogate, ordering and performing treatments and interventions, ordering and review of laboratory studies, ordering and review of radiographic studies, pulse oximetry and re-evaluation of patient's condition.   Final Clinical Impression(s) / ED Diagnoses   Admit to cardiology    Ahmar Pickrell, MD 06/22/20 6811

## 2020-06-22 NOTE — H&P (Addendum)
History and Physical    DOA: 06/21/2020  PCP: [name], FNP  Patient coming from: Home-PCP office-ED  Chief Complaint: Dyspnea with tachycardia/recurrent a flutter with RVR at cardiology office  HPI: Debra Ware is a 54 y.o. female with history h/o paroxysmal a flutter-followed by cardiology Dr. [name] as outpatient, hypertension, asthma, morbid obesity and metastatic cervical cancer, followed by [facility] in [place]- on lifelong chemotherapy (paclitaxel every 3 weeks, gets Decadron course for 1 week after every chemotherapy session), presented to cardiology office- with complaints of increasing lower extremity edema and chest discomfort.  Patient states she felt fine yesterday before she took a nap but when she woke up she felt chest discomfort-retrosternal, 4 out of 10 associated with belching and palpitations prompting visit to cardiology office-Dr. [name] noted her to be diaphoretic with dyspnea and EKG showed 2 is to 1 typical a flutter with heart rate in 160s.  She was sent to ED for further management of a flutter, however patient converted to sinus rhythm spontaneously.  Patient had similar symptoms in September 2020 when she had chest pain relieved with belching, diagnosed with A. fib with RVR.  She had converted to normal sinus rhythm after IV Lopressor in route to ED at that time as well.  Anticoagulation however was deferred per cardiology due to low CHA2DS2-VASc score in 2020.  She had echocardiogram last year as well as April this year showing preserved EF and stage II diastolic dysfunction.  Patient states she takes Lasix 40 mg every morning but tends to miss about 2 to 3 days a week, especially when she has to run errands and does not want to use the bathroom frequently.  Lately she has had worsening leg swellings with weeping/blisters.  She also reports taking metoprolol succinate daily, has prescription for additional metoprolol tartrate PRN but never used  it.  She states she used to weigh 366 pounds in 2019-dropped to 250 pounds in June 2020 at the time of her cancer diagnosis and her good weight now is about 340 pounds.  She cannot tell me if she has gained additional weight lately.  ED course:  Recorded weight in the ED is 344 pounds. Temp max 99.80F, heart rate 158 on arrival, 76 now.  Blood pressure 140/74, O2 sat 98% on room air.  CBC/BMP within normal limits.  Hemoglobin 13.2.  Creatinine 0.8.  Potassium 4.2.  EKG showed normal sinus rhythm with nonspecific ST-T changes (mild ST elevation in V1, V2 and T wave inversions in V5, V6) with QTC 428 milliseconds.  CT chest negative for PE, pleural effusions or pulmonary edema or pneumonia.  It reported small hiatal hernia and moderate to marked severity dextroscoliosis of the thoracic spine with multilevel degenerative changes.  Patient seen by cardiology, received 40 mg IV Lasix around midnight and recommended to admit for further IV diuresis.   Review of Systems: As per HPI otherwise 10 point review of systems negative.    Past Medical History:  Diagnosis Date  . Asthma   . Atrial fibrillation with RVR ([place])   . Cancer ([place])   . 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 Hypertension   . Metastatic cancer ([place])   . Morbid obesity with BMI of 40.0-44.9, adult ([place]) 06/16/2019  . Ovarian cancer ([place])   . PAF (paroxysmal atrial fibrillation) ([place]) 08/16/2019  . 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 No pertinent surgical history.  Social history:  reports that she has never smoked. She has never used smokeless tobacco. She reports current alcohol use. She reports that she does not use drugs.   Allergies  Allergen Reactions  . Azithromycin Hives  . Metformin And Related Other (See Comments)    Per  pt: makes her eat  . Penicillins Rash    Did it involve swelling of the face/tongue/throat, SOB, or low BP? Yes Did it involve sudden or severe rash/hives, skin peeling, or any reaction on the inside of your mouth or nose? No Did you need to seek medical attention at a hospital or doctor's office? No When did it last happen?unknown  If all above answers are "NO", may proceed with cephalosporin use.;  . Sulfa Antibiotics Rash    Family History  Problem Relation Age of Onset  . Heart attack Father   . Lung cancer Father   . Throat cancer Father   . Heart disease Father   . Diabetes Paternal Grandfather   . Heart disease Paternal Grandfather   . Hypertension Paternal Grandfather   . Breast cancer Neg Hx   . Ovarian cancer Neg Hx   . Colon cancer Neg Hx       Prior to Admission medications   Medication Sig Start Date End Date Taking? Authorizing Provider  acetaminophen (TYLENOL) 650 MG CR tablet Take 650 mg by mouth daily as needed for pain.   Yes [provider]  albuterol (VENTOLIN HFA) 108 (90 Base) MCG/ACT inhaler Inhale 1 puff into the lungs every 6 (six) hours as needed for wheezing or shortness of breath.  04/19/20  Yes [provider]  Amino Acids (L-CARNITINE) LIQD Take 15 mLs by mouth 2 (two) times daily.    Yes [provider]  APPLE CIDER VINEGAR PO Take 15 mLs by mouth daily.   Yes [provider]  budesonide-formoterol (SYMBICORT) 160-4.5 MCG/ACT inhaler Inhale 2 puffs into the lungs 2 (two) times daily.  04/21/20  Yes [provider]  Cholecalciferol (VITAMIN D3) 125 MCG (5000 UT) CAPS Take 10,000 Units by mouth daily.   Yes [provider]  dexamethasone (DECADRON) 4 MG tablet Take 8 mg by mouth daily. Take for four days after chemo 08/08/19  Yes [provider]  Digestive Enzymes (SIMILASE PO) Take 1 capsule by mouth daily.   Yes [provider]  fluticasone (FLONASE) 50 MCG/ACT nasal spray Place  1 spray into both nostrils daily as needed for allergies or rhinitis.   Yes [provider]  furosemide (LASIX) 40 MG tablet Take 1 tablet (40 mg total) by mouth 2 (two) times daily. Patient taking differently: Take 40 mg by mouth daily.  03/30/20  Yes Turner, Eber Hong, MD  L-Glutamine POWD Take 10 g by mouth daily. 1 scoop in juice.   Yes [provider]  lidocaine-prilocaine (EMLA) cream Apply 1 application topically See admin instructions. 1 hour before chemo treatment. 07/17/19  Yes [provider]  loratadine (CLARITIN) 10 MG tablet Take 10 mg by mouth daily.   Yes [provider]  Magnesium 200 MG TABS Take 200 mg by mouth daily. Take with furosemide and potassium   Yes [provider]  metoprolol succinate (TOPROL XL) 25 MG 24 hr tablet Take 1 tablet (25 mg total) by mouth daily. 03/30/20 03/30/21 Yes Turner, Eber Hong, MD  metoprolol tartrate (LOPRESSOR) 25 MG tablet  Take 1 tablet (25 mg total) by mouth every 6 (six) hours as needed (palpitations. Hold SBP<100). 08/17/19 06/22/20 Yes Guilford Shi, MD  OVER THE COUNTER MEDICATION Take 1 capsule by mouth daily. Ferrasorb from [facility]; iron with vitamin B and C    Yes [provider]  OVER THE COUNTER MEDICATION Take 15 mLs by mouth daily. Seasame oil   Yes [provider]  potassium chloride SA (KLOR-CON) 20 MEQ tablet Take 20 mEq by mouth daily. Take with Furosemide.   Yes [provider]  pyridoxine (B-6) 100 MG tablet Take 200 mg by mouth daily.   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 Posterior pharynx clear of any exudate or lesions.Normal dentition.  Respiratory: clear to auscultation bilaterally, no wheezing, no crackles. Normal respiratory effort. No accessory muscle use.  Cardiovascular: Regular rate and rhythm, no murmurs / rubs / gallops. No extremity edema. 2+ pedal pulses. No carotid bruits.  Abdomen: Obese, no tenderness, no masses palpated. No hepatosplenomegaly. Bowel sounds positive.  Musculoskeletal: 3+ pitting edema in bilateral lower extremities with blisters in right lower extremity.  No clubbing / cyanosis.  Good ROM along joints, no contractures.  Neurologic: CN 2-12 grossly intact. Sensation intact, DTR normal. Strength 5/5 in all 4.  Psychiatric: Normal judgment and insight. Alert and oriented x 3. Normal mood.  SKIN/catheters: Blisters noted in left lower leg, also has an ulceration at the site of previous blister rupture.  No signs of infection.        Labs on Admission: I have personally reviewed following labs and imaging studies  CBC: Recent Labs  Lab 06/21/20 1710 06/21/20 1905  WBC 8.9 9.9  NEUTROABS  --  5.5  HGB 13.2 12.9  HCT 42.4 41.5  MCV 93.6 93.5  PLT PLATELET CLUMPS NOTED ON SMEAR, UNABLE TO ESTIMATE 540*   Basic Metabolic Panel: Recent Labs  Lab 06/21/20 1710  NA 140  K 4.2  CL 105  CO2 25  GLUCOSE 113*  BUN 14  CREATININE 0.87  CALCIUM 9.6   GFR: Estimated Creatinine Clearance: 123.6 mL/min (by C-G formula based on SCr of 0.87 mg/dL). Recent Labs  Lab 06/21/20 1710 06/21/20 1905  WBC 8.9 9.9   Liver Function Tests: No results for input(s): AST, ALT, ALKPHOS, BILITOT, PROT, ALBUMIN in the last 168 hours. No results for input(s): LIPASE, AMYLASE in the last 168 hours. No results for input(s): AMMONIA in the last 168 hours. Coagulation Profile: No results for input(s): INR, PROTIME in the last 168 hours. Cardiac Enzymes: No results for input(s): CKTOTAL, CKMB, CKMBINDEX, TROPONINI in the last 168  hours. BNP (last 3 results) Recent Labs    04/27/20 1212  PROBNP 321*   HbA1C: No results for input(s): HGBA1C in the last 72 hours. CBG: No results for input(s): GLUCAP in the last 168 hours. Lipid Profile: No results for input(s): CHOL, HDL, LDLCALC, TRIG, CHOLHDL, LDLDIRECT in the last 72 hours. Thyroid Function Tests: No results for input(s): TSH, T4TOTAL, FREET4,  T3FREE, THYROIDAB in the last 72 hours. Anemia Panel: No results for input(s): VITAMINB12, FOLATE, FERRITIN, TIBC, IRON, RETICCTPCT in the last 72 hours. Urine analysis:    Component Value Date/Time   COLORURINE YELLOW 06/27/2019 2037   APPEARANCEUR HAZY (A) 06/27/2019 2037   LABSPEC 1.019 06/27/2019 2037   PHURINE 5.0 06/27/2019 2037   GLUCOSEU NEGATIVE 06/27/2019 2037   HGBUR SMALL (A) 06/27/2019 2037   BILIRUBINUR NEGATIVE 06/27/2019 2037   [place] NEGATIVE 06/27/2019 2037   PROTEINUR NEGATIVE 06/27/2019 2037   NITRITE NEGATIVE 06/27/2019 2037   LEUKOCYTESUR NEGATIVE 06/27/2019 2037    Radiological Exams on Admission: Personally reviewed  DG Chest 2 View  Result Date: 06/21/2020 CLINICAL DATA:  Chest pain EXAM: CHEST - 2 VIEW COMPARISON:  04/20/2020, CT chest 09/17/2019, 08/15/2019 FINDINGS: Left-sided central venous port tip tip over the cavoatrial region. Diffusely increased interstitial opacities. No consolidation or effusion. Stable cardiomediastinal silhouette. No pneumothorax. IMPRESSION: No significant interval change since 04/20/2020. Diffusely increased interstitial opacities which may be due to chronic change. Electronically Signed   By: Donavan Foil M.D.   On: 06/21/2020 17:42   CT Angio Chest PE W and/or Wo Contrast  Result Date: 06/22/2020 CLINICAL DATA:  Chest pain. EXAM: CT ANGIOGRAPHY CHEST WITH CONTRAST TECHNIQUE: Multidetector CT imaging of the chest was performed using the standard protocol during bolus administration of intravenous contrast. Multiplanar CT image reconstructions and MIPs  were obtained to evaluate the vascular anatomy. CONTRAST:  49mL OMNIPAQUE IOHEXOL 350 MG/ML SOLN COMPARISON:  None. FINDINGS: Cardiovascular: A left-sided venous catheter is noted. Satisfactory opacification of the pulmonary arteries to the segmental level. No evidence of pulmonary embolism. Normal heart size. No pericardial effusion. Mediastinum/Nodes: No enlarged mediastinal, hilar, or axillary lymph nodes. Thyroid gland, trachea, and esophagus demonstrate no significant findings. Lungs/Pleura: Lungs are clear. No pleural effusion or pneumothorax. Upper Abdomen: There is a small hiatal hernia. Musculoskeletal: Moderate to marked severity dextroscoliosis of the thoracic spine is seen with multilevel degenerative changes. Review of the MIP images confirms the above findings. IMPRESSION: 1. No CT evidence of pulmonary embolism. 2. Small hiatal hernia. 3. Moderate to marked severity dextroscoliosis of the thoracic spine with multilevel degenerative changes. Electronically Signed   By: Virgina Norfolk M.D.   On: 06/22/2020 03:12    EKG: Independently reviewed.  A flutter with 2 is to 1 conduction     Assessment and Plan:   Active Problems:   PAF (paroxysmal atrial fibrillation) (HCC)   Cancer of endocervical canal (HCC)   Morbid obesity with BMI of 40.0-44.9, adult (HCC)   Palpitations   Paroxysmal atrial flutter (HCC)   Atrial flutter with rapid ventricular response (HCC)   HTN (hypertension)   Acute diastolic CHF (congestive heart failure) ([place])    1.  Symptomatic paroxysmal atrial flutter with RVR: Patient has had 2 known episodes including the current episode-both symptomatic with belching/chest discomfort/dyspnea.  Followed by cardiology as outpatient and was receiving metoprolol as needed for symptomatic a flutter.  Anticoagulation was deferred previously due to low CHA2DS2-VASc score.  Troponin now elevated but likely demand ischemia.  Patient received aspirin on presentation, also  started on heparin drip.  Seen by cardiology who recommended beta-blockers, transition to Eliquis and to reconsider chemo options.  CTA today negative for PE.   2.  Acute diastolic CHF exacerbation: Patient has had steroid related cushingoid symptoms including edema.  She takes Lasix 40 mg daily which was recently increased to twice daily.  Unclear how far she is from  baseline.  BNP elevated in 300s.  She received Lasix 40 mg IV around midnight.  Will admit with IV Lasix 40 mg twice daily for aggressive diuresis-Per cardiology goal is net negative 2 L/day.  Resume potassium supplementation along with IV diuresis.  BMP in a.m.  She has had 2 echocardiograms recently showing similar findings of preserved EF (EF 60 to 65% with stage II diastolic dysfunction may 4827).  Will defer repeat study.  CT chest did not show any evidence of pleural effusions or significant pulmonary edema.  Monitor daily weights/I's and O's.  3.  Hypertension: Patient not on any home meds and it appears to have been taking metoprolol as needed for palpitations.  Will add scheduled metoprolol with holding parameters  4.  Elevated troponin: Likely secondary to problem #1.  EKG without ischemic changes.  Received aspirin on presentation.  Added beta-blockers.  Recent outpatient coronary CT did not show any evidence of CAD.  5.  Metastatic cervical cancer: On chemotherapy, Paclitaxel which could be contributing to problem #1 and problem #2.Marland KitchenShe has lung mets from her cervical CA and extensive adenopathy in the abdominal and pelvic cavities. She is on lifelong chemo and is going to [facility] every 3 weeks for chemo through [facility].  May need to reconsider chemo options given recurrent a flutter with RVR.  6.  Lower extremity edema with blisters/ulceration: No signs of acute infection.  Will obtain wound care consult.  Recommend monitoring with IV diuresis.  May benefit from Unna boot/Ace wrap.  7. Morbid obesity class III  (BMI 49): Probably has fluid weight as well.  Diuresis as tolerated.  Follow-up PCP.  DVT prophylaxis: On heparin drip/Eliquis  COVID screen: Negative  Code Status: Full code as confirmed with patient, next of kin is sister [place]  Patient/Family Communication: Discussed with patient and all questions answered to satisfaction.  Consults called: Cardiology following Admission status :I certify that at the point of admission it is my clinical judgment that the patient will require inpatient hospital care spanning beyond 2 midnights from the point of admission due to high intensity of service and high frequency of surveillance required.Inpatient status is judged to be reasonable and necessary in order to provide the required intensity of service to ensure the patient's safety. The patient's presenting symptoms, physical exam findings, and initial radiographic and laboratory data in the context of their chronic comorbidities is felt to place them at high risk for further clinical deterioration. The following factors support the patient status of inpatient : Fluid overload/CHF exacerbation requiring aggressive IV diuresis.     Guilford Shi MD [facility] Pager in [place]  If 7PM-7AM, please contact night-coverage [email or web address]   06/22/2020, 10:24 AM

## 2020-06-22 NOTE — Consult Note (Signed)
[facility] Nurse Consult Note: Reason for Consult: blisters (intact and ruptured) on left anterior LE, see photo in EMR Wound type: Suspected venous insufficiency in presence of increased fluid and fluid retention Pressure Injury POA: N/A Measurement: 10cm x 10cm area Wound FIE:PPIR, moist Drainage (amount, consistency, odor) small amount serous Periwound: intact with edema Dressing procedure/placement/frequency: I have provided Nursing with guidance for the topical care of these blisters using an antimicrobial nonadherent dressing (xeroform) applied twice daily and secured in a manner that allows for atraumatic removal.   WOC nursing team will not follow, but will remain available to this patient, the nursing and medical teams.  Please re-consult if needed. Thanks, Maudie Flakes, MSN, RN, [place], Arther Abbott  Pager# [phone]

## 2020-06-23 LAB — MAGNESIUM: Magnesium: 2 mg/dL (ref 1.7–2.4)

## 2020-06-23 LAB — BASIC METABOLIC PANEL
Anion gap: 7 (ref 5–15)
BUN: 15 mg/dL (ref 6–20)
CO2: 30 mmol/L (ref 22–32)
Calcium: 9.7 mg/dL (ref 8.9–10.3)
Chloride: 103 mmol/L (ref 98–111)
Creatinine, Ser: 1.06 mg/dL — ABNORMAL HIGH (ref 0.44–1.00)
GFR calc Af Amer: >60 mL/min (ref >60)
GFR calc non Af Amer: >60 mL/min (ref >60)
Glucose, Bld: 148 mg/dL — ABNORMAL HIGH (ref 70–99)
Potassium: 3.8 mmol/L (ref 3.5–5.1)
Sodium: 140 mmol/L (ref 135–145)

## 2020-06-23 MED ORDER — FUROSEMIDE 10 MG/ML IJ SOLN
40.0000 mg | Freq: Two times a day (BID) | INTRAMUSCULAR | Status: DC
  Administered 2020-06-24 – 2020-06-25 (×4): 40 mg via INTRAVENOUS
  Filled 2020-06-23 (×4): qty 4

## 2020-06-23 MED ORDER — SODIUM CHLORIDE 0.9% FLUSH
10.0000 mL | INTRAVENOUS | Status: DC | PRN
  Administered 2020-06-26: 10 mL

## 2020-06-23 MED ORDER — ALTEPLASE 2 MG IJ SOLR
2.0000 mg | Freq: Once | INTRAMUSCULAR | Status: DC
  Filled 2020-06-23: qty 2

## 2020-06-23 MED ORDER — SODIUM CHLORIDE 0.9% FLUSH
10.0000 mL | Freq: Two times a day (BID) | INTRAVENOUS | Status: DC
  Administered 2020-06-23 – 2020-06-26 (×5): 10 mL

## 2020-06-23 MED ORDER — POLYETHYLENE GLYCOL 3350 17 G PO PACK
17.0000 g | PACK | Freq: Every day | ORAL | Status: DC
  Administered 2020-06-24: 17 g via ORAL
  Filled 2020-06-23: qty 1

## 2020-06-23 MED ORDER — LIDOCAINE-PRILOCAINE 2.5-2.5 % EX CREA
TOPICAL_CREAM | Freq: Once | CUTANEOUS | Status: AC
  Filled 2020-06-23: qty 5

## 2020-06-23 MED ORDER — DOCUSATE SODIUM 100 MG PO CAPS
100.0000 mg | ORAL_CAPSULE | Freq: Two times a day (BID) | ORAL | Status: DC
  Administered 2020-06-24 – 2020-06-26 (×4): 100 mg via ORAL
  Filled 2020-06-23 (×7): qty 1

## 2020-06-23 NOTE — Progress Notes (Signed)
Progress Note  Patient Name: Debra Ware Date of Encounter: 06/23/2020  Primary Cardiologist: Fransico Him, MD  Subjective   She is in good spirits. Feeling palpitations today, frequent PVCs. Recurrent afib yesterday evening.   Inpatient Medications    Scheduled Meds: . apixaban  5 mg Oral BID  . Chlorhexidine Gluconate Cloth  6 each Topical Daily  . furosemide  40 mg Intravenous Daily  . lidocaine-prilocaine   Topical Once  . loratadine  10 mg Oral Daily  . magnesium oxide  400 mg Oral Daily  . metoprolol tartrate  25 mg Oral BID  . potassium chloride SA  20 mEq Oral BID   Continuous Infusions:  PRN Meds: acetaminophen **OR** acetaminophen, albuterol, fluticasone, hydrALAZINE, ondansetron **OR** ondansetron (ZOFRAN) IV, polyethylene glycol   Vital Signs    Vitals:   06/22/20 1954 06/23/20 0011 06/23/20 0500 06/23/20 0737  BP: 130/73 128/69 (!) 111/58 127/72  Pulse: 84 70 72 64  Resp: 16 17  18   Temp: 98.8 F (37.1 C) 98.9 F (37.2 C) 98.2 F (36.8 C) 97.8 F (36.6 C)  TempSrc: Oral Oral Oral Oral  SpO2: 99% 93% 99% 96%  Weight:      Height:        Intake/Output Summary (Last 24 hours) at 06/23/2020 1048 Last data filed at 06/22/2020 2000 Gross per 24 hour  Intake 394.17 ml  Output --  Net 394.17 ml   Last 3 Weights 06/22/2020 06/21/2020 06/21/2020  Weight (lbs) 336 lb 14.4 oz 343 lb 14.7 oz 344 lb  Weight (kg) 152.817 kg 156 kg 156.037 kg     Telemetry    NSR with occasional PACs/PVCs, 4 beats NSVT - Personally Reviewed  Physical Exam   GEN: No acute distress.   Neck: JVD difficult to assess Cardiac: RRR, no murmurs, rubs, or gallops.  Respiratory: Clear to auscultation bilaterally. GI: Soft, nontender, non-distended  MS: 3+ bilateral edema; No deformity. Neuro:  Nonfocal  Psych: Normal affect   Labs    High Sensitivity Troponin:   Recent Labs  Lab 06/21/20 1710 06/21/20 2003  TROPONINIHS 75* 68*      Cardiac EnzymesNo results for  input(s): TROPONINI in the last 168 hours. No results for input(s): TROPIPOC in the last 168 hours.   Chemistry Recent Labs  Lab 06/21/20 1710 06/22/20 1135 06/22/20 1520  NA 140 137  --   K 4.2 3.3*  --   CL 105 99  --   CO2 25 27  --   GLUCOSE 113* 119*  --   BUN 14 13  --   CREATININE 0.87 0.94  --   CALCIUM 9.6 9.0  --   PROT  --   --  6.8  ALBUMIN  --   --  3.6  AST  --   --  26  ALT  --   --  28  ALKPHOS  --   --  40  BILITOT  --   --  1.1  GFRNONAA >60 >60  --   GFRAA >60 >60  --   ANIONGAP 10 11  --      Hematology Recent Labs  Lab 06/21/20 1710 06/21/20 1905  WBC 8.9 9.9  RBC 4.53 4.44  HGB 13.2 12.9  HCT 42.4 41.5  MCV 93.6 93.5  MCH 29.1 29.1  MCHC 31.1 31.1  RDW 16.1* 16.2*  PLT PLATELET CLUMPS NOTED ON SMEAR, UNABLE TO ESTIMATE 137*    BNPNo results for input(s): BNP, PROBNP in the  last 168 hours.   DDimer No results for input(s): DDIMER in the last 168 hours.   Radiology    DG Chest 2 View  Result Date: 06/21/2020 CLINICAL DATA:  Chest pain EXAM: CHEST - 2 VIEW COMPARISON:  04/20/2020, CT chest 09/17/2019, 08/15/2019 FINDINGS: Left-sided central venous port tip tip over the cavoatrial region. Diffusely increased interstitial opacities. No consolidation or effusion. Stable cardiomediastinal silhouette. No pneumothorax. IMPRESSION: No significant interval change since 04/20/2020. Diffusely increased interstitial opacities which may be due to chronic change. Electronically Signed   By: Donavan Foil M.D.   On: 06/21/2020 17:42   CT Angio Chest PE W and/or Wo Contrast  Result Date: 06/22/2020 CLINICAL DATA:  Chest pain. EXAM: CT ANGIOGRAPHY CHEST WITH CONTRAST TECHNIQUE: Multidetector CT imaging of the chest was performed using the standard protocol during bolus administration of intravenous contrast. Multiplanar CT image reconstructions and MIPs were obtained to evaluate the vascular anatomy. CONTRAST:  54mL OMNIPAQUE IOHEXOL 350 MG/ML SOLN  COMPARISON:  None. FINDINGS: Cardiovascular: A left-sided venous catheter is noted. Satisfactory opacification of the pulmonary arteries to the segmental level. No evidence of pulmonary embolism. Normal heart size. No pericardial effusion. Mediastinum/Nodes: No enlarged mediastinal, hilar, or axillary lymph nodes. Thyroid gland, trachea, and esophagus demonstrate no significant findings. Lungs/Pleura: Lungs are clear. No pleural effusion or pneumothorax. Upper Abdomen: There is a small hiatal hernia. Musculoskeletal: Moderate to marked severity dextroscoliosis of the thoracic spine is seen with multilevel degenerative changes. Review of the MIP images confirms the above findings. IMPRESSION: 1. No CT evidence of pulmonary embolism. 2. Small hiatal hernia. 3. Moderate to marked severity dextroscoliosis of the thoracic spine with multilevel degenerative changes. Electronically Signed   By: Virgina Norfolk M.D.   On: 06/22/2020 03:12    Cardiac Studies   2D echo 04/2020  1. Left ventricular ejection fraction, by estimation, is 60 to 65%. The  left ventricle has normal function. The left ventricle has no regional  wall motion abnormalities. Left ventricular diastolic parameters are  consistent with Grade II diastolic  dysfunction (pseudonormalization). Elevated left ventricular end-diastolic  pressure.  2. Right ventricular systolic function is normal. The right ventricular  size is normal. There is normal pulmonary artery systolic pressure.  3. Left atrial size was mildly dilated.  4. The mitral valve is normal in structure. Trivial mitral valve  regurgitation. No evidence of mitral stenosis.  5. The aortic valve is tricuspid. Aortic valve regurgitation is not  visualized. No aortic stenosis is present.  6. Aortic dilatation noted. 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  7. The inferior vena cava is normal in size with greater than 50%  respiratory variability,  suggesting right atrial pressure of 3 mmHg.   Patient Profile     54 y.o. female with history of metastatic cervical cancer on chemotherapy, morbid obesity, HTN, DM, diastolic dysfunction, atrial fib 08/2019 initially felt r/t paclitaxel, outpatient CT 09/2019 without CAD. She had episode of CP/palpitations in 08/2019, diagnosed with AF RVR, converted to NSR with IV Lopressor. CHADSVASc was felt to be 1 and AF felt related to possibly be related to paclitaxel treatment, though it was recommended that unless she had frequent episodes, Paclitaxel was to be continued. Outpt coronary CT 09/2019 without CAD. Shewas seen back in clinic by Dr. [name] 03/2020 and washaving problems with DOE as well as chest pressure. [facility] and was placed on steroids and unfortunately hadgained over 80lbs.  HerSOB hadgotten progressively worse and had worsening LE pitting edema. Lasix was increased. RepeatTTE showed a preserved EF of 60-65%, G2DD, and mild LAE. LE venous dopplers negative for DVT and VQ scan showed normal perfusion. . She has lung mets from her cervical CA and extensive adenopathy in the abdominal and pelvic cavities. She was seen in clinic 06/21/20 with increasing LE edema, nonhealing ulcers, diaphoresis and tachycardia and found to be in 2:1 typical atrial flutter. She spontaneously converted to NSR. Initial ED work-up notable for troponins of 75->68 and CTA chest that was negative for PE or pulmonary disease (no metastatic disease noted). There was however moderate to severe dextroscoliosis.   Assessment & Plan    1. 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 Still grossly volume overloaded.  2. Paroxysmal atrial fib/flutter, also with PACs/PVCs and  brief NSVT (4 beats) on telemetry  - in NSR - continue Eliquis and home metoprolol - EP following with plans to start on flecainide once volume status stable  - pt reports prior outpatient sleep study with mild OSA borderline for needing CPAP usage, weight management pursued - MagOx at 400mg  daily dose while on IV Lasix, to address cramping as well.  3. Essential HTN  - controlled  4. Metastatic cervical cancer - [facility] [place]  5. Elevated troponin - hsTroponin 75-68 - relatively low/flat. No symptoms to suggest ACS. Recent coronary CTA was without CAD, therefore likely demand ischemia. Continue beta blocker.  For questions or updates, please contact [place] Please consult [email or web address] for contact info under Cardiology/STEMI.  Signed, Elouise Munroe, MD 06/23/2020, 10:48 AM

## 2020-06-23 NOTE — Progress Notes (Signed)
Left chest Port accessed.  Felt needle went into port and flushes easily.  Flushed with 30cc for no swelling or pain  But no blood return.  Plan to TPA port to resolve blood return.

## 2020-06-23 NOTE — Care Management (Signed)
1155 06-23-20 Benefits check submitted for Eliquis. Case Manager will follow for cost. Graves-Bigelow, Ocie Cornfield, RN,BSN Case Manager

## 2020-06-23 NOTE — Progress Notes (Signed)
Returned to room to complete TPA order; Port now with good blood return when checked. TPA not given; lab order collected and sent.

## 2020-06-23 NOTE — TOC Benefit Eligibility Note (Signed)
Transition of Care [facility]) Benefit Eligibility Note    Patient Details  Name: Debra Ware MRN: [id number] Date of Birth: 1966-12-03   Medication/Dose: Eliquis 5mg . bid for 30 day supply  Covered?: Yes  Tier: 2 Drug  Prescription Coverage [facility]: [facility]  Spoke with Person/Company/Phone Number:: Breck Coons. W/[facility] [employer]/PH# [phone]  Co-Pay: $4.32  Prior Approval: No (?)          Shelda Altes Phone Number: 06/23/2020, 2:06 PM

## 2020-06-23 NOTE — Progress Notes (Signed)
Responded to consult to check PAC. Noted needle positioned to side of port well and will need to be reaccessed. Pt requests numbing cream. Notified RN; RN agrees to request and apply numbing cream if ordered and will consult [facility]AST when completed.

## 2020-06-23 NOTE — Progress Notes (Signed)
PROGRESS NOTE    Debra Ware  RKY:[id number] DOB: 1966/10/15 DOA: 06/21/2020 PCP: [name], FNP   Brief Narrative:   Debra Ware is a 54 y.o. female with history h/o paroxysmal a flutter-followed by cardiology Dr. [name] as outpatient, hypertension, asthma, morbid obesity and metastatic cervical cancer, followed by [facility] in [place]- on lifelong chemotherapy (paclitaxel every 3 weeks, gets Decadron course for 1 week after every chemotherapy session), presented to cardiology office- with complaints of increasing lower extremity edema and chest discomfort.  Patient states she felt fine yesterday before she took a nap but when she woke up she felt chest discomfort-retrosternal, 4 out of 10 associated with belching and palpitations prompting visit to cardiology office-Dr. [name] noted her to be diaphoretic with dyspnea and EKG showed 2 is to 1 typical a flutter with heart rate in 160s.  She was sent to ED for further management of a flutter, however patient converted to sinus rhythm spontaneously.   7/21: Continue IV lasix. Titrate to 40mg  BID as per cards recs. Awaiting EP eval. Appreciate cards assistance.    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       - Followed by cardiology as outpatient and was receiving metoprolol as needed for symptomatic a flutter. Anticoagulation was deferred previously due to low CHA2DS2-VASc score.       - Troponin now elevated but likely demand  ischemia.       - Patient received aspirin on presentation, also started on heparin drip. Seen by cardiology who recommended beta-blockers, transition to Eliquis and to reconsider chemo options.     - CTA negative for PE     - Cards to d/w EP possible need fro AAD; appreciate assistance     - HRs down in the 40's today; she denies complaints  Acute diastolic CHF exacerbation     - Patient has had steroid related cushingoid symptoms including edema.     - Her normal Lasix 40 mg daily was recently increased to twice daily.      - Unclear how far she is from baseline.       - BNP elevated in 300s.     - Currently on IV lasix 40mg  daily; titrate to 40mg  BID per cards     - Per cardiology goal is net negative 2 L/day.     - Potassium supplementation along with IV diuresis.     - She has had 2 echocardiograms recently showing similar findings of preserved EF (EF 60 to 65% with stage II diastolic dysfunction may 3151).  Will defer repeat study.     - CT chest did not show any evidence of pleural effusions or significant pulmonary edema.     - Monitor daily weights/I's and O's.  Hypertension:      - Patient not on any home meds and it appears to have been taking metoprolol as needed for palpitations.     - Metoprolol 25mg  BID w/ hold parameters  Elevated troponin     -  Likely secondary to problem #1.       - EKG without ischemic changes.       - Received aspirin on presentation.      - metoprolol 25 mg BID      - Recent outpatient coronary CT did not show any evidence of CAD.     - Cardiology onboard.  Metastatic cervical cancer     - On chemotherapy,Paclitaxel which could be contributing to problem #1 and problem #2.     - She has lung mets from her cervical CA and extensive adenopathy in the abdominal and pelvic cavities.      - She is on lifelong chemo and is going to [facility] every 3 weeks for chemo through [place].     - May need to reconsider chemo options given  recurrent a flutter with RVR.  Lower extremity edema with blisters/ulceration     - No signs of acute infection.     - Wound care consult, follow up.       - Continue IV diuresis.  Morbid obesity class III (BMI 49)     - Probably has fluid weight as well.       - Diuresis as tolerated.       - Follow-up PCP.  Constipation      - miralax, colace  DVT prophylaxis: Eliquis Code Status: FULL Family Communication: With family at beside   Status is: Inpatient  Remains inpatient appropriate because:Inpatient level of care appropriate due to severity of illness   Dispo: The patient is from: Home              Anticipated d/c is to: Home              Anticipated d/c date is: 2 days              Patient currently is not medically stable to d/c.  Consultants:   Cardiology   ROS:  Denies CP, N, V, ab pain, dyspnea . Remainder 10-pt ROS is negative for all not previously mentioned.  Subjective: "They've been swollen."  Objective: Vitals:   06/23/20 0500 06/23/20 0737 06/23/20 1135 06/23/20 1624  BP: (!) 111/58 127/72 124/81 123/67  Pulse: 72 64 66 (!) 48  Resp:  18 18 18   Temp: 98.2 F (36.8 C) 97.8 F (36.6 C) 98.4 F (36.9 C) 98.5 F (36.9 C)  TempSrc: Oral Oral Oral Oral  SpO2: 99% 96% 94% 95%  Weight:      Height:        Intake/Output Summary (Last 24 hours) at 06/23/2020 1634 Last data filed at 06/22/2020 2000 Gross per 24 hour  Intake 360 ml  Output --  Net 360 ml   Filed Weights   06/21/20 1646 06/21/20 1647 06/22/20 1528  Weight: (!) 156 kg (!) 156 kg (!) 152.8 kg    Examination:  General: 54 y.o. female resting in bed in NAD Cardiovascular: brady, +S1, S2, no m/g/r, equal pulses throughout Respiratory: CTABL, no w/r/r, normal WOB GI: BS+, NDNT, no masses noted, no organomegaly noted MSK: No c/c, BLE edema Neuro: Alert to name, follows commands Psyc: Appropriate interaction and affect, calm/cooperative   Data Reviewed: I have personally reviewed  following labs and imaging studies.  CBC: Recent Labs  Lab 06/21/20 1710 06/21/20 1905  WBC 8.9 9.9  NEUTROABS  --  5.5  HGB 13.2 12.9  HCT 42.4 41.5  MCV 93.6 93.5  PLT PLATELET CLUMPS NOTED ON SMEAR, UNABLE  TO ESTIMATE 202*   Basic Metabolic Panel: Recent Labs  Lab 06/21/20 1710 06/22/20 1135 06/23/20 1400  NA 140 137 140  K 4.2 3.3* 3.8  CL 105 99 103  CO2 25 27 30   GLUCOSE 113* 119* 148*  BUN 14 13 15   CREATININE 0.87 0.94 1.06*  CALCIUM 9.6 9.0 9.7  MG  --  1.8 2.0   GFR: Estimated Creatinine Clearance: 100.2 mL/min (A) (by C-G formula based on SCr of 1.06 mg/dL (H)). Liver Function Tests: Recent Labs  Lab 06/22/20 1520  AST 26  ALT 28  ALKPHOS 40  BILITOT 1.1  PROT 6.8  ALBUMIN 3.6   No results for input(s): LIPASE, AMYLASE in the last 168 hours. No results for input(s): AMMONIA in the last 168 hours. Coagulation Profile: No results for input(s): INR, PROTIME in the last 168 hours. Cardiac Enzymes: No results for input(s): CKTOTAL, CKMB, CKMBINDEX, TROPONINI in the last 168 hours. BNP (last 3 results) Recent Labs    04/27/20 1212  PROBNP 321*   HbA1C: No results for input(s): HGBA1C in the last 72 hours. CBG: No results for input(s): GLUCAP in the last 168 hours. Lipid Profile: No results for input(s): CHOL, HDL, LDLCALC, TRIG, CHOLHDL, LDLDIRECT in the last 72 hours. Thyroid Function Tests: No results for input(s): TSH, T4TOTAL, FREET4, T3FREE, THYROIDAB in the last 72 hours. Anemia Panel: No results for input(s): VITAMINB12, FOLATE, FERRITIN, TIBC, IRON, RETICCTPCT in the last 72 hours. Sepsis Labs: No results for input(s): PROCALCITON, LATICACIDVEN in the last 168 hours.  Recent Results (from the past 240 hour(s))  SARS Coronavirus 2 by RT PCR (hospital order, performed in [facility] hospital lab) Nasopharyngeal Nasopharyngeal Swab     Status: None   Collection Time: 06/22/20  1:14 AM   Specimen: Nasopharyngeal Swab  Result Value Ref  Range Status   SARS Coronavirus 2 NEGATIVE NEGATIVE Final    Comment: (NOTE) SARS-CoV-2 target nucleic acids are NOT DETECTED.  The SARS-CoV-2 RNA is generally detectable in upper and lower respiratory specimens during the acute phase of infection. The lowest concentration of SARS-CoV-2 viral copies this assay can detect is 250 copies / mL. A negative result does not preclude SARS-CoV-2 infection and should not be used as the sole basis for treatment or other patient management decisions.  A negative result may occur with improper specimen collection / handling, submission of specimen other than nasopharyngeal swab, presence of viral mutation(s) within the areas targeted by this assay, and inadequate number of viral copies (<250 copies / mL). A negative result must be combined with clinical observations, patient history, and epidemiological information.  Fact Sheet for Patients:   StrictlyIdeas.no  Fact Sheet for Healthcare Providers: BankingDealers.co.za  This test is not yet approved or  cleared by the [place] FDA and has been authorized for detection and/or diagnosis of SARS-CoV-2 by FDA under an Emergency Use Authorization (EUA).  This EUA will remain in effect (meaning this test can be used) for the duration of the COVID-19 declaration under Section 564(b)(1) of the Act, 21 U.S.C. section 360bbb-3(b)(1), unless the authorization is terminated or revoked sooner.  Performed at [facility], [place] [address] [place], [place] [address]       Radiology Studies: DG Chest 2 View  Result Date: 06/21/2020 CLINICAL DATA:  Chest pain EXAM: CHEST - 2 VIEW COMPARISON:  04/20/2020, CT chest 09/17/2019, 08/15/2019 FINDINGS: Left-sided central venous port tip tip over the cavoatrial region. Diffusely increased interstitial opacities. No consolidation or  effusion. Stable cardiomediastinal silhouette. No pneumothorax. IMPRESSION: No  significant interval change since 04/20/2020. Diffusely increased interstitial opacities which may be due to chronic change. Electronically Signed   By: Donavan Foil M.D.   On: 06/21/2020 17:42   CT Angio Chest PE W and/or Wo Contrast  Result Date: 06/22/2020 CLINICAL DATA:  Chest pain. EXAM: CT ANGIOGRAPHY CHEST WITH CONTRAST TECHNIQUE: Multidetector CT imaging of the chest was performed using the standard protocol during bolus administration of intravenous contrast. Multiplanar CT image reconstructions and MIPs were obtained to evaluate the vascular anatomy. CONTRAST:  44mL OMNIPAQUE IOHEXOL 350 MG/ML SOLN COMPARISON:  None. FINDINGS: Cardiovascular: A left-sided venous catheter is noted. Satisfactory opacification of the pulmonary arteries to the segmental level. No evidence of pulmonary embolism. Normal heart size. No pericardial effusion. Mediastinum/Nodes: No enlarged mediastinal, hilar, or axillary lymph nodes. Thyroid gland, trachea, and esophagus demonstrate no significant findings. Lungs/Pleura: Lungs are clear. No pleural effusion or pneumothorax. Upper Abdomen: There is a small hiatal hernia. Musculoskeletal: Moderate to marked severity dextroscoliosis of the thoracic spine is seen with multilevel degenerative changes. Review of the MIP images confirms the above findings. IMPRESSION: 1. No CT evidence of pulmonary embolism. 2. Small hiatal hernia. 3. Moderate to marked severity dextroscoliosis of the thoracic spine with multilevel degenerative changes. Electronically Signed   By: Virgina Norfolk M.D.   On: 06/22/2020 03:12     Scheduled Meds: . alteplase  2 mg Intracatheter Once  . apixaban  5 mg Oral BID  . Chlorhexidine Gluconate Cloth  6 each Topical Daily  . docusate sodium  100 mg Oral BID  . furosemide  40 mg Intravenous Daily  . loratadine  10 mg Oral Daily  . magnesium oxide  400 mg Oral Daily  . metoprolol tartrate  25 mg Oral BID  . polyethylene glycol  17 g Oral Daily  .  potassium chloride SA  20 mEq Oral BID  . sodium chloride flush  10-40 mL Intracatheter Q12H   Continuous Infusions:   LOS: 1 day    Time spent: 25 minutes spent in the coordination of care today.    Jonnie Finner, DO [facility]  If 7PM-7AM, please contact night-coverage [email or web address] 06/23/2020, 4:34 PM

## 2020-06-23 NOTE — Progress Notes (Signed)
EMLA cream applied and IV team contacted to access patient port.

## 2020-06-24 LAB — COMPREHENSIVE METABOLIC PANEL
ALT: 32 U/L (ref 0–44)
AST: 34 U/L (ref 15–41)
Albumin: 3.5 g/dL (ref 3.5–5.0)
Alkaline Phosphatase: 34 U/L — ABNORMAL LOW (ref 38–126)
Anion gap: 7 (ref 5–15)
BUN: 11 mg/dL (ref 6–20)
CO2: 27 mmol/L (ref 22–32)
Calcium: 9.4 mg/dL (ref 8.9–10.3)
Chloride: 103 mmol/L (ref 98–111)
Creatinine, Ser: 0.99 mg/dL (ref 0.44–1.00)
GFR calc Af Amer: >60 mL/min (ref >60)
GFR calc non Af Amer: >60 mL/min (ref >60)
Glucose, Bld: 114 mg/dL — ABNORMAL HIGH (ref 70–99)
Potassium: 4 mmol/L (ref 3.5–5.1)
Sodium: 137 mmol/L (ref 135–145)
Total Bilirubin: 1.1 mg/dL (ref 0.3–1.2)
Total Protein: 6.1 g/dL — ABNORMAL LOW (ref 6.5–8.1)

## 2020-06-24 LAB — MAGNESIUM: Magnesium: 1.8 mg/dL (ref 1.7–2.4)

## 2020-06-24 LAB — CBC WITH DIFFERENTIAL/PLATELET
Abs Immature Granulocytes: 0.07 10*3/uL (ref 0.00–0.07)
Basophils Absolute: 0 10*3/uL (ref 0.0–0.1)
Basophils Relative: 0 %
Eosinophils Absolute: 0.1 10*3/uL (ref 0.0–0.5)
Eosinophils Relative: 1 %
HCT: 39.9 % (ref 36.0–46.0)
Hemoglobin: 12.2 g/dL (ref 12.0–15.0)
Immature Granulocytes: 1 %
Lymphocytes Relative: 35 %
Lymphs Abs: 2.8 10*3/uL (ref 0.7–4.0)
MCH: 28.3 pg (ref 26.0–34.0)
MCHC: 30.6 g/dL (ref 30.0–36.0)
MCV: 92.6 fL (ref 80.0–100.0)
Monocytes Absolute: 0.9 10*3/uL (ref 0.1–1.0)
Monocytes Relative: 11 %
Neutro Abs: 4.2 10*3/uL (ref 1.7–7.7)
Neutrophils Relative %: 52 %
Platelets: 149 10*3/uL — ABNORMAL LOW (ref 150–400)
RBC: 4.31 MIL/uL (ref 3.87–5.11)
RDW: 15.9 % — ABNORMAL HIGH (ref 11.5–15.5)
WBC: 8 10*3/uL (ref 4.0–10.5)
nRBC: 0 % (ref 0.0–0.2)

## 2020-06-24 MED ORDER — POLYETHYLENE GLYCOL 3350 17 G PO PACK: 17.0000 g | PACK | Freq: Every day | ORAL | Status: DC | PRN

## 2020-06-24 NOTE — Progress Notes (Signed)
PROGRESS NOTE    Debra Ware  KXF:[id number] DOB: 10-09-1966 DOA: 06/21/2020 PCP: [name], FNP   Brief Narrative:   Debra Mooreis a 54 y.o.femalewith history h/oparoxysmal a flutter-followed by cardiology Dr. [name] as outpatient, hypertension, asthma, morbid obesity and metastatic cervical cancer, [facility] [place] [facility] (paclitaxelevery 3 weeks, gets Decadron course for 1 week after every chemotherapy session), presented to cardiology office-withcomplaints of increasing lower extremity edema and chest discomfort. Patient states she felt fine yesterday before shetook a nap but when she woke up she felt chest discomfort-retrosternal, 4 out of 10 associatedwith belching and palpitations prompting visit to cardiology office-Dr. [name] noted her to be diaphoretic with dyspnea and EKG showed2 is to 1 typical a flutter with heart rate in 160s. She was sent to ED for further management of a flutter, however patient converted to sinus rhythm spontaneously.   7/22: Continue diuresis through today. Resp status is improving. If she is the same tomorrow, consider for discharge then. Appreciate cardiology assistance. Let's get her up and mobile.    Assessment & Plan:   Active Problems:   Cancer of endocervical canal ([place])   Morbid obesity with BMI of 40.0-44.9, adult (HCC)   PAF (paroxysmal atrial fibrillation) (HCC)   Palpitations   Paroxysmal atrial flutter (HCC)   Atrial flutter with rapid ventricular response (HCC)   HTN (hypertension)   Acute diastolic CHF (congestive heart failure) (HCC)  Symptomatic paroxysmal atrial flutter with RVR     - Patient has had 2 known episodes including the current episode-both symptomatic with belching/chest discomfort/dyspnea.       - Followed by cardiology as outpatient and was receiving metoprolol as needed for symptomatic a flutter. Anticoagulation was deferred previously due to low  CHA2DS2-VASc score.       - Troponin now elevated but likely demand ischemia.       - Patient received aspirin on presentation, also started on heparin drip. Seen by cardiology who recommended beta-blockers, transition to Eliquis and to reconsider chemo options.     - CTA negative for PE     - Cards to d/w EP possible need fro AAD; appreciate assistance     - 7/22: per cardiology  Acute diastolic CHF exacerbation     - Patient has had steroid related cushingoid symptoms including edema.     - Her normal Lasix 40 mg daily was recently increased to twice daily.      - Unclear how far she is from baseline.       - BNP elevated in 300s.     - Currently on IV lasix 40mg  BID per cards recs     - Per cardiology goal is net negative 2 L/day.     - Potassium supplementation along with IV diuresis.     - She has had 2 echocardiograms recently showing similar findings of preserved EF (EF 60 to 65% with stage II diastolic dysfunction may 6967).  Will defer repeat study.     - CT chest did not show any evidence of pleural effusions or significant pulmonary edema.     - Monitor daily weights/I's and O's.     - 7/22: Continue IV diuresis at least through today; resp status is improving  Hypertension:      - Patient not on any home meds and it appears to have been taking metoprolol as needed for palpitations.     - Metoprolol 25mg  BID w/ hold parameters  Elevated troponin     -  Likely secondary to problem #1.       - EKG without ischemic changes.       - Received aspirin on presentation.      - metoprolol 25 mg BID      - Recent outpatient coronary CT did not show any evidence of CAD.     - Cardiology onboard.  Metastatic cervical cancer     - On chemotherapy,Paclitaxel which could be contributing to problem #1 and problem #2.     - She has lung mets from her cervical CA and extensive adenopathy in the abdominal and pelvic cavities.      - She is on lifelong chemo and is going to [facility]  every 3 weeks for chemo through [place].     - May need to reconsider chemo options given recurrent a flutter with RVR.  Lower extremity edema with blisters/ulceration     - No signs of acute infection.     - Wound care consult, follow up.       - Continue IV diuresis.  Morbid obesity class III (BMI 49)     - Probably has fluid weight as well.       - Diuresis as tolerated.       - Follow-up PCP.  Constipation      - miralax, colace  DVT prophylaxis: eliquis Code Status: FULL Family Communication: None at bedside   Status is: Inpatient  Remains inpatient appropriate because:Inpatient level of care appropriate due to severity of illness   Dispo: The patient is from: Home              Anticipated d/c is to: Home              Anticipated d/c date is: 1 day              Patient currently is not medically stable to d/c.  Consultants:   Cardiology  Procedures:   None  Antimicrobials:  . None   ROS:  Reports improved dyspnea. Denies CP, N, V, ab pain . Remainder ROS is negative for all not previously mentioned.  Subjective: "The biggest problem is when I get moving around more than 100 feet."  Objective: Vitals:   06/23/20 1135 06/23/20 1624 06/23/20 2049 06/24/20 0609  BP: 124/81 123/67 114/67 105/71  Pulse: 66 (!) 48 66 (!) 56  Resp: 18 18    Temp: 98.4 F (36.9 C) 98.5 F (36.9 C) 98.6 F (37 C) 97.7 F (36.5 C)  TempSrc: Oral Oral Oral Oral  SpO2: 94% 95% 100% 94%  Weight:    (!) 151.5 kg  Height:        Intake/Output Summary (Last 24 hours) at 06/24/2020 0756 Last data filed at 06/23/2020 2000 Gross per 24 hour  Intake 360 ml  Output --  Net 360 ml   Filed Weights   06/21/20 1647 06/22/20 1528 06/24/20 0609  Weight: (!) 156 kg (!) 152.8 kg (!) 151.5 kg   Examination:  General: 54 y.o. female resting in bed in NAD Cardiovascular: RRR, +S1, S2, no m/g/r, equal pulses throughout Respiratory: CTABL, no w/r/r, normal WOB GI:  BS+, NDNT, no masses noted, no organomegaly noted, obese MSK: No e/c/c; BLE edema, LLE banding CDI Neuro: alert to name, follows commands Psyc: Appropriate interaction and affect, calm/cooperative  Data Reviewed: I have personally reviewed following labs and imaging studies.  CBC: Recent Labs  Lab 06/21/20 1710 06/21/20 1905  WBC 8.9 9.9  NEUTROABS  --  5.5  HGB 13.2 12.9  HCT 42.4 41.5  MCV 93.6 93.5  PLT PLATELET CLUMPS NOTED ON SMEAR, UNABLE TO ESTIMATE 341*   Basic Metabolic Panel: Recent Labs  Lab 06/21/20 1710 06/22/20 1135 06/23/20 1400 06/24/20 0500  NA 140 137 140 137  K 4.2 3.3* 3.8 4.0  CL 105 99 103 103  CO2 25 27 30 27   GLUCOSE 113* 119* 148* 114*  BUN 14 13 15 11   CREATININE 0.87 0.94 1.06* 0.99  CALCIUM 9.6 9.0 9.7 9.4  MG  --  1.8 2.0 1.8   GFR: Estimated Creatinine Clearance: 106.7 mL/min (by C-G formula based on SCr of 0.99 mg/dL). Liver Function Tests: Recent Labs  Lab 06/22/20 1520 06/24/20 0500  AST 26 34  ALT 28 32  ALKPHOS 40 34*  BILITOT 1.1 1.1  PROT 6.8 6.1*  ALBUMIN 3.6 3.5   No results for input(s): LIPASE, AMYLASE in the last 168 hours. No results for input(s): AMMONIA in the last 168 hours. Coagulation Profile: No results for input(s): INR, PROTIME in the last 168 hours. Cardiac Enzymes: No results for input(s): CKTOTAL, CKMB, CKMBINDEX, TROPONINI in the last 168 hours. BNP (last 3 results) Recent Labs    04/27/20 1212  PROBNP 321*   HbA1C: No results for input(s): HGBA1C in the last 72 hours. CBG: No results for input(s): GLUCAP in the last 168 hours. Lipid Profile: No results for input(s): CHOL, HDL, LDLCALC, TRIG, CHOLHDL, LDLDIRECT in the last 72 hours. Thyroid Function Tests: No results for input(s): TSH, T4TOTAL, FREET4, T3FREE, THYROIDAB in the last 72 hours. Anemia Panel: No results for input(s): VITAMINB12, FOLATE, FERRITIN, TIBC, IRON, RETICCTPCT in the last 72 hours. Sepsis Labs: No results for input(s):  PROCALCITON, LATICACIDVEN in the last 168 hours.  Recent Results (from the past 240 hour(s))  SARS Coronavirus 2 by RT PCR (hospital order, performed in [facility] hospital lab) Nasopharyngeal Nasopharyngeal Swab     Status: None   Collection Time: 06/22/20  1:14 AM   Specimen: Nasopharyngeal Swab  Result Value Ref Range Status   SARS Coronavirus 2 NEGATIVE NEGATIVE Final    Comment: (NOTE) SARS-CoV-2 target nucleic acids are NOT DETECTED.  The SARS-CoV-2 RNA is generally detectable in upper and lower respiratory specimens during the acute phase of infection. The lowest concentration of SARS-CoV-2 viral copies this assay can detect is 250 copies / mL. A negative result does not preclude SARS-CoV-2 infection and should not be used as the sole basis for treatment or other patient management decisions.  A negative result may occur with improper specimen collection / handling, submission of specimen other than nasopharyngeal swab, presence of viral mutation(s) within the areas targeted by this assay, and inadequate number of viral copies (<250 copies / mL). A negative result must be combined with clinical observations, patient history, and epidemiological information.  Fact Sheet for Patients:   StrictlyIdeas.no  Fact Sheet for Healthcare Providers: BankingDealers.co.za  This test is not yet approved or  cleared by the [place] FDA and has been authorized for detection and/or diagnosis of SARS-CoV-2 by FDA under an Emergency Use Authorization (EUA).  This EUA will remain in effect (meaning this test can be used) for the duration of the COVID-19 declaration under Section 564(b)(1) of the Act, 21 U.S.C. section 360bbb-3(b)(1), unless the authorization is terminated or revoked sooner.  Performed at [facility], [place] [address] [place], [place] [address]       Radiology Studies: No results found.  Scheduled Meds: .  alteplase  2 mg Intracatheter Once  . apixaban  5 mg Oral BID  . Chlorhexidine Gluconate Cloth  6 each Topical Daily  . docusate sodium  100 mg Oral BID  . furosemide  40 mg Intravenous BID  . loratadine  10 mg Oral Daily  . magnesium oxide  400 mg Oral Daily  . metoprolol tartrate  25 mg Oral BID  . polyethylene glycol  17 g Oral Daily  . potassium chloride SA  20 mEq Oral BID  . sodium chloride flush  10-40 mL Intracatheter Q12H   Continuous Infusions:   LOS: 2 days    Time spent: 25 minutes spent in the coordination of care today.    Jonnie Finner, DO [facility]  If 7PM-7AM, please contact night-coverage [email or web address] 06/24/2020, 7:56 AM

## 2020-06-24 NOTE — Progress Notes (Signed)
Progress Note  Patient Name: Debra Ware Date of Encounter: 06/24/2020  Primary Cardiologist: Fransico Him, MD  Subjective   Continues to feel palpitations, likely from PVCs  Inpatient Medications    Scheduled Meds: . alteplase  2 mg Intracatheter Once  . apixaban  5 mg Oral BID  . Chlorhexidine Gluconate Cloth  6 each Topical Daily  . docusate sodium  100 mg Oral BID  . furosemide  40 mg Intravenous BID  . loratadine  10 mg Oral Daily  . magnesium oxide  400 mg Oral Daily  . metoprolol tartrate  25 mg Oral BID  . polyethylene glycol  17 g Oral Daily  . potassium chloride SA  20 mEq Oral BID  . sodium chloride flush  10-40 mL Intracatheter Q12H   Continuous Infusions:  PRN Meds: acetaminophen **OR** acetaminophen, albuterol, fluticasone, hydrALAZINE, ondansetron **OR** ondansetron (ZOFRAN) IV, sodium chloride flush   Vital Signs    Vitals:   06/23/20 1624 06/23/20 2049 06/24/20 0609 06/24/20 0827  BP: 123/67 114/67 105/71 133/61  Pulse: (!) 48 66 (!) 56 67  Resp: 18     Temp: 98.5 F (36.9 C) 98.6 F (37 C) 97.7 F (36.5 C) 98.3 F (36.8 C)  TempSrc: Oral Oral Oral Oral  SpO2: 95% 100% 94% 97%  Weight:   (!) 151.5 kg   Height:        Intake/Output Summary (Last 24 hours) at 06/24/2020 0853 Last data filed at 06/23/2020 2000 Gross per 24 hour  Intake 360 ml  Output --  Net 360 ml   Last 3 Weights 06/24/2020 06/22/2020 06/21/2020  Weight (lbs) 334 lb 336 lb 14.4 oz 343 lb 14.7 oz  Weight (kg) 151.501 kg 152.817 kg 156 kg     Telemetry    NSR with frequent PVCs - Personally Reviewed  Physical Exam   GEN: No acute distress.   Neck: JVD challenging to assess Cardiac: irregular rhythm, normal rate, no murmurs, rubs, or gallops.  Respiratory: Clear to auscultation bilaterally. GI: Soft, nontender, non-distended  MS: 2+ edema; No deformity. Neuro:  Nonfocal  Psych: Normal affect   Labs    High Sensitivity Troponin:   Recent Labs  Lab  06/21/20 1710 06/21/20 2003  TROPONINIHS 75* 68*      Cardiac EnzymesNo results for input(s): TROPONINI in the last 168 hours. No results for input(s): TROPIPOC in the last 168 hours.   Chemistry Recent Labs  Lab 06/22/20 1135 06/22/20 1520 06/23/20 1400 06/24/20 0500  NA 137  --  140 137  K 3.3*  --  3.8 4.0  CL 99  --  103 103  CO2 27  --  30 27  GLUCOSE 119*  --  148* 114*  BUN 13  --  15 11  CREATININE 0.94  --  1.06* 0.99  CALCIUM 9.0  --  9.7 9.4  PROT  --  6.8  --  6.1*  ALBUMIN  --  3.6  --  3.5  AST  --  26  --  34  ALT  --  28  --  32  ALKPHOS  --  40  --  34*  BILITOT  --  1.1  --  1.1  GFRNONAA >60  --  >60 >60  GFRAA >60  --  >60 >60  ANIONGAP 11  --  7 7     Hematology Recent Labs  Lab 06/21/20 1710 06/21/20 1905 06/24/20 0500  WBC 8.9 9.9 8.0  RBC 4.53 4.44  4.31  HGB 13.2 12.9 12.2  HCT 42.4 41.5 39.9  MCV 93.6 93.5 92.6  MCH 29.1 29.1 28.3  MCHC 31.1 31.1 30.6  RDW 16.1* 16.2* 15.9*  PLT PLATELET CLUMPS NOTED ON SMEAR, UNABLE TO ESTIMATE 137* 149*    BNPNo results for input(s): BNP, PROBNP in the last 168 hours.   DDimer No results for input(s): DDIMER in the last 168 hours.   Radiology    No results found.  Cardiac Studies   2D echo 04/2020  1. Left ventricular ejection fraction, by estimation, is 60 to 65%. The  left ventricle has normal function. The left ventricle has no regional  wall motion abnormalities. Left ventricular diastolic parameters are  consistent with Grade II diastolic  dysfunction (pseudonormalization). Elevated left ventricular end-diastolic  pressure.  2. Right ventricular systolic function is normal. The right ventricular  size is normal. There is normal pulmonary artery systolic pressure.  3. Left atrial size was mildly dilated.  4. The mitral valve is normal in structure. Trivial mitral valve  regurgitation. No evidence of mitral stenosis.  5. The aortic valve is tricuspid. Aortic valve  regurgitation is not  visualized. No aortic stenosis is present.  6. Aortic dilatation noted. 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  7. The inferior vena cava is normal in size with greater than 50%  respiratory variability, suggesting right atrial pressure of 3 mmHg.   Patient Profile     54 y.o. female with history of metastatic cervical cancer on chemotherapy, morbid obesity, HTN, DM, diastolic dysfunction, atrial fib 08/2019 initially felt r/t paclitaxel, outpatient CT 09/2019 without CAD. She had episode of CP/palpitations in 08/2019, diagnosed with AF RVR, converted to NSR with IV Lopressor. CHADSVASc was felt to be 1 and AF felt related to possibly be related to paclitaxel treatment, though it was recommended that unless she had frequent episodes, Paclitaxel was to be continued. Outpt coronary CT 09/2019 without CAD. Shewas seen back in clinic by Dr. [name] 03/2020 and washaving problems with DOE as well as chest pressure. [facility] [place] [facility] and was [facility] and unfortunately hadgained over 80lbs. HerSOB hadgotten progressively worse and had worsening LE pitting edema. Lasix was increased. RepeatTTE showed a preserved EF of 60-65%, G2DD, and mild LAE. LE venous dopplers negative for DVT and VQ scan showed normal perfusion. . She has lung mets from her cervical CA and extensive adenopathy in the abdominal and pelvic cavities. She was seen in clinic 06/21/20 with increasing LE edema, nonhealing ulcers, diaphoresis and tachycardia and found to be in 2:1 typical atrial flutter. She spontaneously converted to NSR. Initial ED work-up notable for troponins of 75->68 and CTA chest that was negative for PE or pulmonary disease (no metastatic disease noted). There was however moderate to severe dextroscoliosis.   Assessment & Plan    1. 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 Still volume overloaded.  2. Paroxysmal atrial fib/flutter, also with PACs/PVCs and brief NSVT (4 beats) on telemetry  - in NSR - continue Eliquis and home metoprolol - EP not actively following but recommend starting on flecainide once volume status stable  - will discuss with patient pending discharge and clinical course - pt reports prior outpatient sleep study with mild OSA borderline  for needing CPAP usage, weight management pursued - MagOx at 400mg  daily dose while on IV Lasix, to address cramping as well.  3. Essential HTN  - controlled  4. Metastatic cervical cancer - [facility] [place]  5. Elevated troponin - hsTroponin 75-68 - relatively low/flat. No symptoms to suggest ACS. Recent coronary CTA was without CAD, therefore likely demand ischemia. Continue beta blocker.  For questions or updates, please contact [place] Please consult [email or web address] for contact info under Cardiology/STEMI.  Signed, Elouise Munroe, MD 06/24/2020, 8:53 AM

## 2020-06-25 LAB — RENAL FUNCTION PANEL
Albumin: 3.5 g/dL (ref 3.5–5.0)
Anion gap: 10 (ref 5–15)
BUN: 10 mg/dL (ref 6–20)
CO2: 28 mmol/L (ref 22–32)
Calcium: 9.5 mg/dL (ref 8.9–10.3)
Chloride: 100 mmol/L (ref 98–111)
Creatinine, Ser: 0.87 mg/dL (ref 0.44–1.00)
GFR calc Af Amer: >60 mL/min (ref >60)
GFR calc non Af Amer: >60 mL/min (ref >60)
Glucose, Bld: 117 mg/dL — ABNORMAL HIGH (ref 70–99)
Phosphorus: 4.3 mg/dL (ref 2.5–4.6)
Potassium: 3.8 mmol/L (ref 3.5–5.1)
Sodium: 138 mmol/L (ref 135–145)

## 2020-06-25 LAB — CBC WITH DIFFERENTIAL/PLATELET
Abs Immature Granulocytes: 0.05 10*3/uL (ref 0.00–0.07)
Basophils Absolute: 0 10*3/uL (ref 0.0–0.1)
Basophils Relative: 0 %
Eosinophils Absolute: 0.1 10*3/uL (ref 0.0–0.5)
Eosinophils Relative: 1 %
HCT: 39 % (ref 36.0–46.0)
Hemoglobin: 12.2 g/dL (ref 12.0–15.0)
Immature Granulocytes: 1 %
Lymphocytes Relative: 35 %
Lymphs Abs: 2.9 10*3/uL (ref 0.7–4.0)
MCH: 28.6 pg (ref 26.0–34.0)
MCHC: 31.3 g/dL (ref 30.0–36.0)
MCV: 91.5 fL (ref 80.0–100.0)
Monocytes Absolute: 1 10*3/uL (ref 0.1–1.0)
Monocytes Relative: 11 %
Neutro Abs: 4.4 10*3/uL (ref 1.7–7.7)
Neutrophils Relative %: 52 %
Platelets: 147 10*3/uL — ABNORMAL LOW (ref 150–400)
RBC: 4.26 MIL/uL (ref 3.87–5.11)
RDW: 15.7 % — ABNORMAL HIGH (ref 11.5–15.5)
WBC: 8.4 10*3/uL (ref 4.0–10.5)
nRBC: 0 % (ref 0.0–0.2)

## 2020-06-25 LAB — MAGNESIUM: Magnesium: 1.8 mg/dL (ref 1.7–2.4)

## 2020-06-25 MED ORDER — DIPHENHYDRAMINE-ZINC ACETATE 2-0.1 % EX CREA
TOPICAL_CREAM | Freq: Two times a day (BID) | CUTANEOUS | Status: DC | PRN
  Administered 2020-06-25: 1 via TOPICAL
  Filled 2020-06-25: qty 28

## 2020-06-25 NOTE — Progress Notes (Signed)
Dr. Margaretann Loveless reached out to discuss f/u plan. The following appts have been made:  8/17 with Gerrianne Scale, PA with general cardiology 8/24 with A. Chalmers Cater, PA for EP team Appts available for view on AVS. Arrow Emmerich PA-C

## 2020-06-25 NOTE — Progress Notes (Signed)
PROGRESS NOTE    [name]  PZW:[id number] DOB: [birth year] DOA: 06/21/2020 PCP: Debra Fiedler, FNP   Brief Narrative:   Debra Ware a 54 y.o.femalewith history h/oparoxysmal a flutter-followed by cardiology Dr. [name] as outpatient, hypertension, asthma, morbid obesity and metastatic cervical cancer, [facility] [place] [facility] (paclitaxelevery 3 weeks, gets Decadron course for 1 week after every chemotherapy session), presented to cardiology office-withcomplaints of increasing lower extremity edema and chest discomfort. Patient states she felt fine yesterday before shetook a nap but when she woke up she felt chest discomfort-retrosternal, 4 out of 10 associatedwith belching and palpitations prompting visit to cardiology office-Dr. [name] noted her to be diaphoretic with dyspnea and EKG showed2 is to 1 typical a flutter with heart rate in 160s. She was sent to ED for further management of a flutter, however patient converted to sinus rhythm spontaneously.  7/23: Resp status better. Continuing diuresis through tonight. Home in morning. Appreciate cardiology assistance.   Assessment & Plan:   Active Problems:   Cancer of endocervical canal ([place])   Morbid obesity with BMI of 40.0-44.9, adult (HCC)   PAF (paroxysmal atrial fibrillation) (HCC)   Palpitations   Paroxysmal atrial flutter (HCC)   Atrial flutter with rapid ventricular response (HCC)   HTN (hypertension)   Acute diastolic CHF (congestive heart failure) (HCC)  Symptomatic paroxysmal atrial flutter with RVR - Patient has had 2 known episodes including the current episode-both symptomatic with belching/chest discomfort/dyspnea.  - Followed by cardiology as outpatient and was receiving metoprolol as needed for symptomatic a flutter. Anticoagulation was deferred previously due to low CHA2DS2-VASc score.  - Troponin now elevated but likely demand  ischemia.  - Patient received aspirin on presentation, also started on heparin drip. Seen by cardiology who recommended beta-blockers, transition to Eliquis and to reconsider chemo options. - CTA negative for PE - Cards to d/w EP possible need fro AAD; appreciate assistance - 7/23: Holding off on flecanide. Follow up with cardiology outpt..    Acute diastolic CHF exacerbation - Patient has had steroid related cushingoid symptoms including edema. - Her normal Lasix 40 mg daily was recently increased to twice daily.  - Unclear how far she is from baseline.  - BNP elevated in 300s. - Currently on IV lasix 40mg  BID per cards recs - Per cardiology goal is net negative 2 L/day. - Potassium supplementation along with IV diuresis. - She has had 2 echocardiograms recently showing similar findings of preserved EF (EF 60 to 65% with stage II diastolic dysfunction may 4235). Will defer repeat study. - CT chest did not show any evidence of pleural effusions or significant pulmonary edema. - Monitor daily weights/I's and O's.     - 7/23:  Continue diuresis through tonight. Likely home in AM  Hypertension:  - Patient not on any home meds and it appears to have been taking metoprolol as needed for palpitations. - Metoprolol 25mg  BID w/ hold parameters  Elevated troponin - Likely secondary to problem #1.  - EKG without ischemic changes.  - Received aspirin on presentation.  - metoprolol 25 mg BID  - Recent outpatient coronary CT did not show any evidence of CAD. - Cardiology onboard.  Metastatic cervical cancer - On chemotherapy,Paclitaxel which could be contributing to problem #1 and problem #2. - She has lung mets from her cervical CA and extensive adenopathy in the abdominal and pelvic cavities.  - She is on lifelong chemo and is going to [facility] every 3 weeks for  chemo through [place]. - May need to reconsider chemo options given recurrent a flutter with RVR.  Lower extremity edema with blisters/ulceration - No signs of acute infection. - Wound care consult, follow up.  - Continue IV diuresis through tonight  Morbid obesity class III (BMI 49) - Probably has fluid weight as well.  - Diuresis as tolerated.  - Follow-up PCP.  Constipation - miralax, colace  DVT prophylaxis: eliquis Code Status: FULL Family Communication: None at bedside   Status is: Inpatient  Remains inpatient appropriate because:Inpatient level of care appropriate due to severity of illness   Dispo: The patient is from: Home              Anticipated d/c is to: Home              Anticipated d/c date is: 1 day              Patient currently is not medically stable to d/c.   Consultants:   Cardiology  ROS:  Denies CP, N, V ab pain. Remainder ROS is negative for all not previously mentioned.  Subjective: "That sounds great."  Objective: Vitals:   06/24/20 1645 06/24/20 2210 06/25/20 0506 06/25/20 1000  BP: 128/72 (!) 128/89 (!) 125/59 118/80  Pulse: 62 68  72  Resp: 16 15    Temp: 97.9 F (36.6 C) 98.1 F (36.7 C) 97.8 F (36.6 C)   TempSrc: Oral Oral Oral   SpO2: 95% 96% 98%   Weight:   (!) 150 kg   Height:        Intake/Output Summary (Last 24 hours) at 06/25/2020 1500 Last data filed at 06/25/2020 1000 Gross per 24 hour  Intake 840 ml  Output 1650 ml  Net -810 ml   Filed Weights   06/22/20 1528 06/24/20 0609 06/25/20 0506  Weight: (!) 152.8 kg (!) 151.5 kg (!) 150 kg    Examination:  General: 54 y.o. female resting in bed in NAD Cardiovascular: RRR, +S1, S2, no m/g/r Respiratory: CTABL, no w/r/r, normal WOB GI: BS+, NDNT, soft, obese MSK: No c/c; BLE edema Neuro: A&O x 3, no focal deficits Psyc: Appropriate interaction and affect, calm/cooperative  Data Reviewed: I have personally reviewed following  labs and imaging studies.  CBC: Recent Labs  Lab 06/21/20 1710 06/21/20 1905 06/24/20 0500 06/25/20 0500  WBC 8.9 9.9 8.0 8.4  NEUTROABS  --  5.5 4.2 4.4  HGB 13.2 12.9 12.2 12.2  HCT 42.4 41.5 39.9 39.0  MCV 93.6 93.5 92.6 91.5  PLT PLATELET CLUMPS NOTED ON SMEAR, UNABLE TO ESTIMATE 137* 149* 694*   Basic Metabolic Panel: Recent Labs  Lab 06/21/20 1710 06/22/20 1135 06/23/20 1400 06/24/20 0500 06/25/20 0500  NA 140 137 140 137 138  K 4.2 3.3* 3.8 4.0 3.8  CL 105 99 103 103 100  CO2 25 27 30 27 28   GLUCOSE 113* 119* 148* 114* 117*  BUN 14 13 15 11 10   CREATININE 0.87 0.94 1.06* 0.99 0.87  CALCIUM 9.6 9.0 9.7 9.4 9.5  MG  --  1.8 2.0 1.8 1.8  PHOS  --   --   --   --  4.3   GFR: Estimated Creatinine Clearance: 120.7 mL/min (by C-G formula based on SCr of 0.87 mg/dL). Liver Function Tests: Recent Labs  Lab 06/22/20 1520 06/24/20 0500 06/25/20 0500  AST 26 34  --   ALT 28 32  --   ALKPHOS 40 34*  --  BILITOT 1.1 1.1  --   PROT 6.8 6.1*  --   ALBUMIN 3.6 3.5 3.5   No results for input(s): LIPASE, AMYLASE in the last 168 hours. No results for input(s): AMMONIA in the last 168 hours. Coagulation Profile: No results for input(s): INR, PROTIME in the last 168 hours. Cardiac Enzymes: No results for input(s): CKTOTAL, CKMB, CKMBINDEX, TROPONINI in the last 168 hours. BNP (last 3 results) Recent Labs    04/27/20 1212  PROBNP 321*   HbA1C: No results for input(s): HGBA1C in the last 72 hours. CBG: No results for input(s): GLUCAP in the last 168 hours. Lipid Profile: No results for input(s): CHOL, HDL, LDLCALC, TRIG, CHOLHDL, LDLDIRECT in the last 72 hours. Thyroid Function Tests: No results for input(s): TSH, T4TOTAL, FREET4, T3FREE, THYROIDAB in the last 72 hours. Anemia Panel: No results for input(s): VITAMINB12, FOLATE, FERRITIN, TIBC, IRON, RETICCTPCT in the last 72 hours. Sepsis Labs: No results for input(s): PROCALCITON, LATICACIDVEN in the last 168  hours.  Recent Results (from the past 240 hour(s))  SARS Coronavirus 2 by RT PCR (hospital order, performed in [facility] hospital lab) Nasopharyngeal Nasopharyngeal Swab     Status: None   Collection Time: 06/22/20  1:14 AM   Specimen: Nasopharyngeal Swab  Result Value Ref Range Status   SARS Coronavirus 2 NEGATIVE NEGATIVE Final    Comment: (NOTE) SARS-CoV-2 target nucleic acids are NOT DETECTED.  The SARS-CoV-2 RNA is generally detectable in upper and lower respiratory specimens during the acute phase of infection. The lowest concentration of SARS-CoV-2 viral copies this assay can detect is 250 copies / mL. A negative result does not preclude SARS-CoV-2 infection and should not be used as the sole basis for treatment or other patient management decisions.  A negative result may occur with improper specimen collection / handling, submission of specimen other than nasopharyngeal swab, presence of viral mutation(s) within the areas targeted by this assay, and inadequate number of viral copies (<250 copies / mL). A negative result must be combined with clinical observations, patient history, and epidemiological information.  Fact Sheet for Patients:   StrictlyIdeas.no  Fact Sheet for Healthcare Providers: BankingDealers.co.za  This test is not yet approved or  cleared by the [place] FDA and has been authorized for detection and/or diagnosis of SARS-CoV-2 by FDA under an Emergency Use Authorization (EUA).  This EUA will remain in effect (meaning this test can be used) for the duration of the COVID-19 declaration under Section 564(b)(1) of the Act, 21 U.S.C. section 360bbb-3(b)(1), unless the authorization is terminated or revoked sooner.  Performed at [facility], [place] [address], [place], [place] [address]       Radiology Studies: No results found.   Scheduled Meds: . alteplase  2 mg Intracatheter Once  .  apixaban  5 mg Oral BID  . Chlorhexidine Gluconate Cloth  6 each Topical Daily  . docusate sodium  100 mg Oral BID  . furosemide  40 mg Intravenous BID  . loratadine  10 mg Oral Daily  . magnesium oxide  400 mg Oral Daily  . metoprolol tartrate  25 mg Oral BID  . potassium chloride SA  20 mEq Oral BID  . sodium chloride flush  10-40 mL Intracatheter Q12H   Continuous Infusions:   LOS: 3 days   Time spent: 25 minutes spent in the coordination of care today.    Jonnie Finner, DO [facility]  If 7PM-7AM, please contact night-coverage [email or web address] 06/25/2020, 3:00  PM

## 2020-06-26 DIAGNOSIS — R002 Palpitations: Secondary | ICD-10-CM

## 2020-06-26 DIAGNOSIS — I480 Paroxysmal atrial fibrillation: Secondary | ICD-10-CM

## 2020-06-26 LAB — CBC WITH DIFFERENTIAL/PLATELET
Abs Immature Granulocytes: 0.04 10*3/uL (ref 0.00–0.07)
Basophils Absolute: 0 10*3/uL (ref 0.0–0.1)
Basophils Relative: 0 %
Eosinophils Absolute: 0.2 10*3/uL (ref 0.0–0.5)
Eosinophils Relative: 2 %
HCT: 40.7 % (ref 36.0–46.0)
Hemoglobin: 12.9 g/dL (ref 12.0–15.0)
Immature Granulocytes: 1 %
Lymphocytes Relative: 33 %
Lymphs Abs: 2.9 10*3/uL (ref 0.7–4.0)
MCH: 28.9 pg (ref 26.0–34.0)
MCHC: 31.7 g/dL (ref 30.0–36.0)
MCV: 91.3 fL (ref 80.0–100.0)
Monocytes Absolute: 1 10*3/uL (ref 0.1–1.0)
Monocytes Relative: 12 %
Neutro Abs: 4.5 10*3/uL (ref 1.7–7.7)
Neutrophils Relative %: 52 %
Platelets: 152 10*3/uL (ref 150–400)
RBC: 4.46 MIL/uL (ref 3.87–5.11)
RDW: 15.5 % (ref 11.5–15.5)
WBC: 8.6 10*3/uL (ref 4.0–10.5)
nRBC: 0 % (ref 0.0–0.2)

## 2020-06-26 LAB — RENAL FUNCTION PANEL
Albumin: 3.6 g/dL (ref 3.5–5.0)
Anion gap: 10 (ref 5–15)
BUN: 14 mg/dL (ref 6–20)
CO2: 28 mmol/L (ref 22–32)
Calcium: 9.7 mg/dL (ref 8.9–10.3)
Chloride: 99 mmol/L (ref 98–111)
Creatinine, Ser: 0.93 mg/dL (ref 0.44–1.00)
GFR calc Af Amer: >60 mL/min (ref >60)
GFR calc non Af Amer: >60 mL/min (ref >60)
Glucose, Bld: 115 mg/dL — ABNORMAL HIGH (ref 70–99)
Phosphorus: 4.5 mg/dL (ref 2.5–4.6)
Potassium: 3.6 mmol/L (ref 3.5–5.1)
Sodium: 137 mmol/L (ref 135–145)

## 2020-06-26 LAB — MAGNESIUM: Magnesium: 1.8 mg/dL (ref 1.7–2.4)

## 2020-06-26 MED ORDER — TORSEMIDE 20 MG PO TABS
20.0000 mg | ORAL_TABLET | Freq: Two times a day (BID) | ORAL | Status: DC
  Administered 2020-06-26: 20 mg via ORAL
  Filled 2020-06-26: qty 1

## 2020-06-26 MED ORDER — HEPARIN SOD (PORK) LOCK FLUSH 100 UNIT/ML IV SOLN
500.0000 [IU] | INTRAVENOUS | Status: AC | PRN
  Administered 2020-06-26: 500 [IU]
  Filled 2020-06-26: qty 5

## 2020-06-26 MED ORDER — TORSEMIDE 20 MG PO TABS: 20.0000 mg | ORAL_TABLET | Freq: Two times a day (BID) | ORAL | 0 refills | Status: DC

## 2020-06-26 MED ORDER — APIXABAN 5 MG PO TABS: 5.0000 mg | ORAL_TABLET | Freq: Two times a day (BID) | ORAL | 0 refills | Status: DC

## 2020-06-26 MED ORDER — POTASSIUM CHLORIDE CRYS ER 20 MEQ PO TBCR: 20.0000 meq | EXTENDED_RELEASE_TABLET | Freq: Every day | ORAL | Status: DC

## 2020-06-26 MED ORDER — METOPROLOL TARTRATE 25 MG PO TABS: 25.0000 mg | ORAL_TABLET | Freq: Two times a day (BID) | ORAL | 0 refills | Status: DC

## 2020-06-26 MED ORDER — MAGNESIUM OXIDE 400 (241.3 MG) MG PO TABS: 400.0000 mg | ORAL_TABLET | Freq: Every day | ORAL | 0 refills | Status: AC

## 2020-06-26 NOTE — Progress Notes (Signed)
Progress Note  Patient Name: Debra Ware Date of Encounter: 06/26/2020  Primary Cardiologist: Fransico Him, MD  Subjective   Volume status improving, we discussed timing of hospital discharge  Inpatient Medications    Scheduled Meds: . alteplase  2 mg Intracatheter Once  . apixaban  5 mg Oral BID  . Chlorhexidine Gluconate Cloth  6 each Topical Daily  . docusate sodium  100 mg Oral BID  . furosemide  40 mg Intravenous BID  . loratadine  10 mg Oral Daily  . magnesium oxide  400 mg Oral Daily  . metoprolol tartrate  25 mg Oral BID  . potassium chloride SA  20 mEq Oral BID  . sodium chloride flush  10-40 mL Intracatheter Q12H   Continuous Infusions:  PRN Meds: acetaminophen **OR** acetaminophen, albuterol, diphenhydrAMINE-zinc acetate, fluticasone, hydrALAZINE, ondansetron **OR** ondansetron (ZOFRAN) IV, polyethylene glycol, sodium chloride flush   Vital Signs    Vitals:   06/25/20 1000 06/25/20 1645 06/25/20 2116 06/26/20 0627  BP: 118/80 125/74 (!) 131/65 (!) 142/83  Pulse: 72 70 72 63  Resp:  16 18 20   Temp:  98 F (36.7 C) 98.6 F (37 C) 98 F (36.7 C)  TempSrc:  Oral Oral Oral  SpO2:  99% 99% 99%  Weight:    (!) 149.6 kg  Height:        Intake/Output Summary (Last 24 hours) at 06/26/2020 0731 Last data filed at 06/26/2020 0630 Gross per 24 hour  Intake -  Output 1400 ml  Net -1400 ml   Last 3 Weights 06/26/2020 06/25/2020 06/24/2020  Weight (lbs) 329 lb 14.4 oz 330 lb 11.2 oz 334 lb  Weight (kg) 149.642 kg 150.005 kg 151.501 kg     Telemetry    NSR with frequent PVCs - Personally Reviewed  Physical Exam   GEN: No acute distress.   Neck: No JVD Cardiac: irregular rhythm, normal rate, no murmurs, rubs, or gallops.  Respiratory: Clear to auscultation bilaterally. GI: Soft, nontender, non-distended  MS: 2+ edema; No deformity. Neuro:  Nonfocal  Psych: Normal affect    Labs    High Sensitivity Troponin:   Recent Labs  Lab 06/21/20 1710  06/21/20 2003  TROPONINIHS 75* 68*      Cardiac EnzymesNo results for input(s): TROPONINI in the last 168 hours. No results for input(s): TROPIPOC in the last 168 hours.   Chemistry Recent Labs  Lab 06/22/20 1135 06/22/20 1520 06/23/20 1400 06/24/20 0500 06/25/20 0500  NA   < >  --  140 137 138  K   < >  --  3.8 4.0 3.8  CL   < >  --  103 103 100  CO2   < >  --  30 27 28   GLUCOSE   < >  --  148* 114* 117*  BUN   < >  --  15 11 10   CREATININE   < >  --  1.06* 0.99 0.87  CALCIUM   < >  --  9.7 9.4 9.5  PROT  --  6.8  --  6.1*  --   ALBUMIN  --  3.6  --  3.5 3.5  AST  --  26  --  34  --   ALT  --  28  --  32  --   ALKPHOS  --  40  --  34*  --   BILITOT  --  1.1  --  1.1  --   GFRNONAA   < >  --  >  60 >60 >60  GFRAA   < >  --  >60 >60 >60  ANIONGAP   < >  --  7 7 10    < > = values in this interval not displayed.     Hematology Recent Labs  Lab 06/24/20 0500 06/25/20 0500 06/26/20 0500  WBC 8.0 8.4 8.6  RBC 4.31 4.26 4.46  HGB 12.2 12.2 12.9  HCT 39.9 39.0 40.7  MCV 92.6 91.5 91.3  MCH 28.3 28.6 28.9  MCHC 30.6 31.3 31.7  RDW 15.9* 15.7* 15.5  PLT 149* 147* 152    BNPNo results for input(s): BNP, PROBNP in the last 168 hours.   DDimer No results for input(s): DDIMER in the last 168 hours.   Radiology    No results found.  Cardiac Studies   2D echo 04/2020  1. Left ventricular ejection fraction, by estimation, is 60 to 65%. The  left ventricle has normal function. The left ventricle has no regional  wall motion abnormalities. Left ventricular diastolic parameters are  consistent with Grade II diastolic  dysfunction (pseudonormalization). Elevated left ventricular end-diastolic  pressure.  2. Right ventricular systolic function is normal. The right ventricular  size is normal. There is normal pulmonary artery systolic pressure.  3. Left atrial size was mildly dilated.  4. The mitral valve is normal in structure. Trivial mitral valve  regurgitation.  No evidence of mitral stenosis.  5. The aortic valve is tricuspid. Aortic valve regurgitation is not  visualized. No aortic stenosis is present.  6. Aortic dilatation noted. 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  7. The inferior vena cava is normal in size with greater than 50%  respiratory variability, suggesting right atrial pressure of 3 mmHg.   Patient Profile     54 y.o. female with history of metastatic cervical cancer on chemotherapy, morbid obesity, HTN, DM, diastolic dysfunction, atrial fib 08/2019 initially felt r/t paclitaxel, outpatient CT 09/2019 without CAD. She had episode of CP/palpitations in 08/2019, diagnosed with AF RVR, converted to NSR with IV Lopressor. CHADSVASc was felt to be 1 and AF felt related to possibly be related to paclitaxel treatment, though it was recommended that unless she had frequent episodes, Paclitaxel was to be continued. Outpt coronary CT 09/2019 without CAD. Shewas seen back in clinic by Dr. [name] 03/2020 and washaving problems with DOE as well as chest pressure. [facility] and was placed on steroids and unfortunately hadgained over 80lbs. HerSOB hadgotten progressively worse and had worsening LE pitting edema. Lasix was increased. RepeatTTE showed a preserved EF of 60-65%, G2DD, and mild LAE. LE venous dopplers negative for DVT and VQ scan showed normal perfusion. . She has lung mets from her cervical CA and extensive adenopathy in the abdominal and pelvic cavities. She was seen in clinic 06/21/20 with increasing LE edema, nonhealing ulcers, diaphoresis and tachycardia and found to be in 2:1 typical atrial flutter. She spontaneously converted to NSR. Initial ED work-up notable for troponins of 75->68 and CTA chest that was negative for PE or pulmonary disease (no metastatic disease noted). There was however moderate to severe dextroscoliosis.   Assessment & Plan    1. 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 Should have a BMET in 7-10 days with PCP  2. Paroxysmal atrial fib/flutter, also with PACs/PVCs and brief NSVT (4 beats) on telemetry  - in NSR - continue Eliquis and home metoprolol - patient and I discussed flecanide. She would like to defer "if not absolutely necessary" since she is maintaining SR currently, can discuss further at EP follow up.  - pt reports prior outpatient sleep study with mild OSA borderline for needing CPAP usage, weight management pursued - MagOx at 400mg  daily dose should continue at home.   3. Essential HTN  - controlled  4. Metastatic cervical cancer - [facility] [place]  5. Elevated troponin - hsTroponin 75-68 - relatively low/flat. No symptoms to suggest ACS. Recent coronary CTA was without CAD, therefore likely demand ischemia. Continue beta blocker.  For questions or updates, please contact [place] Please consult [email or web address] for contact info under Cardiology/STEMI.  Signed, Elouise Munroe, MD

## 2020-06-26 NOTE — Care Management (Signed)
Patient given Eliquis coupons for 30 day free and to reduce copay.

## 2020-06-26 NOTE — Discharge Summary (Signed)
Physician Discharge Summary  Debra Ware JOA:[id number] DOB: 1966-10-27 DOA: 06/21/2020  PCP: [name], FNP  Admit date: 06/21/2020 Discharge date: 06/26/2020  Admitted From: Home Disposition:  Discharged to home.   Recommendations for Outpatient Follow-up:  1. Follow up with PCP in 1 weeks 2. Follow up with Cardiology as scheduled.  Discharge Condition: Stable  CODE STATUS: FULL   Brief/Interim Summary: Debra Mooreis a 54 y.o.femalewith history h/oparoxysmal a flutter-followed by cardiology Dr. [name] as outpatient, hypertension, asthma, morbid obesity and metastatic cervical cancer, [facility] [place] [facility] (paclitaxelevery 3 weeks, gets Decadron course for 1 week after every chemotherapy session), presented to cardiology office-withcomplaints of increasing lower extremity edema and chest discomfort. Patient states she felt fine yesterday before shetook a nap but when she woke up she felt chest discomfort-retrosternal, 4 out of 10 associatedwith belching and palpitations prompting visit to cardiology office-Dr. [name] noted her to be diaphoretic with dyspnea and EKG showed2 is to 1 typical a flutter with heart rate in 160s. She was sent to ED for further management of a flutter, however patient converted to sinus rhythm spontaneously.  7/24: HR/BP stable. Renal function stable. Transition to torsemide. Follow up with Cardiology and EP on 8/17 and 8/24. She is good for discharge to home.  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  - Troponin now elevated but likely demand ischemia.  - Patient received aspirin on presentation, also started on heparin drip. Seen by cardiology who recommended beta-blockers, transition to Eliquis and to reconsider chemo options. - CTA negative for PE - Cards to d/w EP possible need fro AAD; appreciate assistance - Holding off on flecanide. Follow up with cardiology outpt.     - 7/24: Rates are stable. Has follow up with gen cardiology and EP for 8/17 and 8/24.     Acute diastolic CHF exacerbation - Patient has had steroid related cushingoid symptoms including edema. - Her normal Lasix 40 mg daily was recently increased to twice daily.  - Unclear how far she is from baseline.  - BNP elevated in 300s. - K+/Mg2+ supplementation along with diuresis. - She has had 2 echocardiograms recently showing similar findings of preserved EF (EF 60 to 65% with stage II diastolic dysfunction may 6010). Will defer repeat study. - CT chest did not show any evidence of pleural effusions or significant pulmonary edema. - Monitor daily weights/I's and O's.     - 7/24: transitioned to torsemide 20 mg BID; continue K+/Mg2+ supplementation at discharge; has cardiology f/u arranged  Hypertension:  - Patient not on any home meds and it appears to have been taking metoprolol as needed for palpitations. - Metoprolol 25mg  BID w/ hold parameters  Elevated troponin - Likely secondary to problem #1.  - EKG without ischemic changes.  - Received aspirin on presentation.  - metoprolol 25 mg BID  - Recent outpatient coronary CT did not show any evidence of CAD. - Cardiology onboard; medical management  Metastatic cervical cancer - On chemotherapy,Paclitaxel which could be contributing to problem #1 and problem  #  2. - She has lung mets from her cervical CA and extensive adenopathy in the abdominal and pelvic cavities.  - She is on lifelong chemo and is going to [facility] every 3 weeks for chemo through [place]. - May need to reconsider chemo options given recurrent a flutter with RVR.  Lower extremity edema with blisters/ulceration - No signs of acute infection. - Wound care consult, follow up.  - transitioned to torsemide  Morbid obesity class III (BMI 49) - Probably has fluid weight as well.  - Diuresis as tolerated.  - Follow-up PCP.  Constipation - miralax, colace  Discharge Instructions   Allergies as of 06/26/2020      Reactions   Azithromycin Hives   Metformin And Related Other (See Comments)   Per pt: makes her eat   Penicillins Rash   Did it involve swelling of the face/tongue/throat, SOB, or low BP? Yes Did it involve sudden or severe rash/hives, skin peeling, or any reaction on the inside of your mouth or nose? No Did you need to seek medical attention at a hospital or doctor's office? No When did it last happen?unknown  If all above answers are "NO", may proceed with cephalosporin use.;   Sulfa Antibiotics Rash      Medication List    STOP taking these medications   furosemide 40 MG tablet Commonly known as: LASIX   Magnesium 200 MG Tabs Replaced by: magnesium oxide 400 (241.3 Mg) MG tablet   metoprolol succinate 25 MG 24 hr tablet Commonly known as: Toprol XL     TAKE these medications   acetaminophen 650 MG CR tablet Commonly known as: TYLENOL Take 650 mg by mouth daily as needed for pain.   albuterol 108 (90 Base) MCG/ACT inhaler Commonly known as: VENTOLIN HFA Inhale 1 puff into the lungs every 6 (six) hours as needed for wheezing or shortness of breath.   apixaban 5 MG Tabs tablet Commonly known as: ELIQUIS Take 1 tablet (5 mg total) by mouth 2 (two) times daily.   APPLE CIDER  VINEGAR PO Take 15 mLs by mouth daily.   budesonide-formoterol 160-4.5 MCG/ACT inhaler Commonly known as: SYMBICORT Inhale 2 puffs into the lungs 2 (two) times daily.   dexamethasone 4 MG tablet Commonly known as: DECADRON Take 8 mg by mouth daily. Take for four days after chemo   fluticasone 50 MCG/ACT nasal spray Commonly known as: FLONASE Place 1 spray into both nostrils daily as needed for allergies or rhinitis.   L-Carnitine Liqd Take 15 mLs by mouth 2 (two) times daily.   L-Glutamine Powd Take 10 g by mouth daily. 1 scoop in juice.   lidocaine-prilocaine cream Commonly known as: EMLA Apply 1 application topically See admin instructions. 1 hour before chemo treatment.   loratadine 10 MG tablet Commonly known as: CLARITIN Take 10 mg by mouth daily.   magnesium oxide 400 (241.3 Mg) MG tablet Commonly known as: MAG-OX Take 1 tablet (400 mg total) by mouth daily. Start taking on: June 27, 2020 Replaces: Magnesium 200 MG Tabs   metoprolol tartrate 25 MG tablet Commonly known as: LOPRESSOR Take 1 tablet (25 mg total) by mouth 2 (two) times daily. What changed:   when to take this  reasons to take this   OVER THE COUNTER MEDICATION Take 1 capsule by mouth daily. Ferrasorb from [place]; iron with vitamin B and C   OVER THE COUNTER MEDICATION Take 15 mLs by mouth daily. Seasame oil   potassium chloride SA  20 MEQ tablet Commonly known as: KLOR-CON Take 1 tablet (20 mEq total) by mouth daily. Take with torsemide. What changed: additional instructions   pyridoxine 100 MG tablet Commonly known as: B-6 Take 200 mg by mouth daily.   SIMILASE PO Take 1 capsule by mouth daily.   torsemide 20 MG tablet Commonly known as: DEMADEX Take 1 tablet (20 mg total) by mouth 2 (two) times daily.   Vitamin D3 125 MCG (5000 UT) Caps Take 10,000 Units by mouth daily.       Follow-up Information    [place] [facility] Follow up.   Specialty: Cardiology Why:  [facility] - See below for the follow-up appointments made for you: - 07/20/20 at 1:45pm with Ermalinda Barrios, PA with our general cardiology team / Dr. [name] - 07/27/20 at 9:40am with Oda Kilts, [place] with our electrophysiology team Contact information: [address] [place] [address] [phone]             Allergies  Allergen Reactions  . Azithromycin Hives  . Metformin And Related Other (See Comments)    Per pt: makes her eat  . Penicillins Rash    Did it involve swelling of the face/tongue/throat, SOB, or low BP? Yes Did it involve sudden or severe rash/hives, skin peeling, or any reaction on the inside of your mouth or nose? No Did you need to seek medical attention at a hospital or doctor's office? No When did it last happen?unknown  If all above answers are "NO", may proceed with cephalosporin use.;  . Sulfa Antibiotics Rash    Consultations:  Cardiology  Procedures/Studies: DG Chest 2 View  Result Date: 06/21/2020 CLINICAL DATA:  Chest pain EXAM: CHEST - 2 VIEW COMPARISON:  04/20/2020, CT chest 09/17/2019, 08/15/2019 FINDINGS: Left-sided central venous port tip tip over the cavoatrial region. Diffusely increased interstitial opacities. No consolidation or effusion. Stable cardiomediastinal silhouette. No pneumothorax. IMPRESSION: No significant interval change since 04/20/2020. Diffusely increased interstitial opacities which may be due to chronic change. Electronically Signed   By: Donavan Foil M.D.   On: 06/21/2020 17:42   CT Angio Chest PE W and/or Wo Contrast  Result Date: 06/22/2020 CLINICAL DATA:  Chest pain. EXAM: CT ANGIOGRAPHY CHEST WITH CONTRAST TECHNIQUE: Multidetector CT imaging of the chest was performed using the standard protocol during bolus administration of intravenous contrast. Multiplanar CT image reconstructions and MIPs were obtained to evaluate the vascular anatomy. CONTRAST:  34mL OMNIPAQUE IOHEXOL 350  MG/ML SOLN COMPARISON:  None. FINDINGS: Cardiovascular: A left-sided venous catheter is noted. Satisfactory opacification of the pulmonary arteries to the segmental level. No evidence of pulmonary embolism. Normal heart size. No pericardial effusion. Mediastinum/Nodes: No enlarged mediastinal, hilar, or axillary lymph nodes. Thyroid gland, trachea, and esophagus demonstrate no significant findings. Lungs/Pleura: Lungs are clear. No pleural effusion or pneumothorax. Upper Abdomen: There is a small hiatal hernia. Musculoskeletal: Moderate to marked severity dextroscoliosis of the thoracic spine is seen with multilevel degenerative changes. Review of the MIP images confirms the above findings. IMPRESSION: 1. No CT evidence of pulmonary embolism. 2. Small hiatal hernia. 3. Moderate to marked severity dextroscoliosis of the thoracic spine with multilevel degenerative changes. Electronically Signed   By: Virgina Norfolk M.D.   On: 06/22/2020 03:12     Subjective: "I think I am good."  Discharge Exam: Vitals:   06/26/20 0627 06/26/20 0914  BP: (!) 142/83 (!) 139/75  Pulse: 63 80  Resp: 20   Temp: 98 F (  36.7 C)   SpO2: 99%    Vitals:   06/25/20 1645 06/25/20 2116 06/26/20 0627 06/26/20 0914  BP: 125/74 (!) 131/65 (!) 142/83 (!) 139/75  Pulse: 70 72 63 80  Resp: 16 18 20    Temp: 98 F (36.7 C) 98.6 F (37 C) 98 F (36.7 C)   TempSrc: Oral Oral Oral   SpO2: 99% 99% 99%   Weight:   (!) 149.6 kg   Height:        General: 54 y.o. female resting in bed in NAD Cardiovascular: RRR, +S1, S2, no m/g/r, equal pulses throughout Respiratory: CTABL, no w/r/r, normal WOB GI: BS+, NDNT, no masses noted, no organomegaly noted MSK: No c/c; BLE edema improved Neuro: A&O x 3, no focal deficits Psyc: Appropriate interaction and affect, calm/cooperative  The results of significant diagnostics from this hospitalization (including imaging, microbiology, ancillary and laboratory) are listed below for  reference.     Microbiology: Recent Results (from the past 240 hour(s))  SARS Coronavirus 2 by RT PCR (hospital order, performed in [facility] hospital lab) Nasopharyngeal Nasopharyngeal Swab     Status: None   Collection Time: 06/22/20  1:14 AM   Specimen: Nasopharyngeal Swab  Result Value Ref Range Status   SARS Coronavirus 2 NEGATIVE NEGATIVE Final    Comment: (NOTE) SARS-CoV-2 target nucleic acids are NOT DETECTED.  The SARS-CoV-2 RNA is generally detectable in upper and lower respiratory specimens during the acute phase of infection. The lowest concentration of SARS-CoV-2 viral copies this assay can detect is 250 copies / mL. A negative result does not preclude SARS-CoV-2 infection and should not be used as the sole basis for treatment or other patient management decisions.  A negative result may occur with improper specimen collection / handling, submission of specimen other than nasopharyngeal swab, presence of viral mutation(s) within the areas targeted by this assay, and inadequate number of viral copies (<250 copies / mL). A negative result must be combined with clinical observations, patient history, and epidemiological information.  Fact Sheet for Patients:   StrictlyIdeas.no  Fact Sheet for Healthcare Providers: BankingDealers.co.za  This test is not yet approved or  cleared by the [place] FDA and has been authorized for detection and/or diagnosis of SARS-CoV-2 by FDA under an Emergency Use Authorization (EUA).  This EUA will remain in effect (meaning this test can be used) for the duration of the COVID-19 declaration under Section 564(b)(1) of the Act, 21 U.S.C. section 360bbb-3(b)(1), unless the authorization is terminated or revoked sooner.  Performed at [facility], [place] [address] [place], [place] [address]      Labs: BNP (last 3 results) No results for input(s): BNP in the last 8760  hours. Basic Metabolic Panel: Recent Labs  Lab 06/22/20 1135 06/23/20 1400 06/24/20 0500 06/25/20 0500 06/26/20 0500  NA 137 140 137 138 137  K 3.3* 3.8 4.0 3.8 3.6  CL 99 103 103 100 99  CO2 27 30 27 28 28   GLUCOSE 119* 148* 114* 117* 115*  BUN 13 15 11 10 14   CREATININE 0.94 1.06* 0.99 0.87 0.93  CALCIUM 9.0 9.7 9.4 9.5 9.7  MG 1.8 2.0 1.8 1.8 1.8  PHOS  --   --   --  4.3 4.5   Liver Function Tests: Recent Labs  Lab 06/22/20 1520 06/24/20 0500 06/25/20 0500 06/26/20 0500  AST 26 34  --   --   ALT 28 32  --   --   ALKPHOS 40  34*  --   --   BILITOT 1.1 1.1  --   --   PROT 6.8 6.1*  --   --   ALBUMIN 3.6 3.5 3.5 3.6   No results for input(s): LIPASE, AMYLASE in the last 168 hours. No results for input(s): AMMONIA in the last 168 hours. CBC: Recent Labs  Lab 06/21/20 1710 06/21/20 1905 06/24/20 0500 06/25/20 0500 06/26/20 0500  WBC 8.9 9.9 8.0 8.4 8.6  NEUTROABS  --  5.5 4.2 4.4 4.5  HGB 13.2 12.9 12.2 12.2 12.9  HCT 42.4 41.5 39.9 39.0 40.7  MCV 93.6 93.5 92.6 91.5 91.3  PLT PLATELET CLUMPS NOTED ON SMEAR, UNABLE TO ESTIMATE 137* 149* 147* 152   Cardiac Enzymes: No results for input(s): CKTOTAL, CKMB, CKMBINDEX, TROPONINI in the last 168 hours. BNP: Invalid input(s): POCBNP CBG: No results for input(s): GLUCAP in the last 168 hours. D-Dimer No results for input(s): DDIMER in the last 72 hours. Hgb A1c No results for input(s): HGBA1C in the last 72 hours. Lipid Profile No results for input(s): CHOL, HDL, LDLCALC, TRIG, CHOLHDL, LDLDIRECT in the last 72 hours. Thyroid function studies No results for input(s): TSH, T4TOTAL, T3FREE, THYROIDAB in the last 72 hours.  Invalid input(s): FREET3 Anemia work up No results for input(s): VITAMINB12, FOLATE, FERRITIN, TIBC, IRON, RETICCTPCT in the last 72 hours. Urinalysis    Component Value Date/Time   COLORURINE YELLOW 06/27/2019 2037   APPEARANCEUR HAZY (A) 06/27/2019 2037   LABSPEC 1.019 06/27/2019 2037    PHURINE 5.0 06/27/2019 2037   GLUCOSEU NEGATIVE 06/27/2019 2037   HGBUR SMALL (A) 06/27/2019 2037   BILIRUBINUR NEGATIVE 06/27/2019 2037   [place] NEGATIVE 06/27/2019 2037   PROTEINUR NEGATIVE 06/27/2019 2037   NITRITE NEGATIVE 06/27/2019 2037   LEUKOCYTESUR NEGATIVE 06/27/2019 2037   Sepsis Labs Invalid input(s): PROCALCITONIN,  WBC,  LACTICIDVEN Microbiology Recent Results (from the past 240 hour(s))  SARS Coronavirus 2 by RT PCR (hospital order, performed in [place] hospital lab) Nasopharyngeal Nasopharyngeal Swab     Status: None   Collection Time: 06/22/20  1:14 AM   Specimen: Nasopharyngeal Swab  Result Value Ref Range Status   SARS Coronavirus 2 NEGATIVE NEGATIVE Final    Comment: (NOTE) SARS-CoV-2 target nucleic acids are NOT DETECTED.  The SARS-CoV-2 RNA is generally detectable in upper and lower respiratory specimens during the acute phase of infection. The lowest concentration of SARS-CoV-2 viral copies this assay can detect is 250 copies / mL. A negative result does not preclude SARS-CoV-2 infection and should not be used as the sole basis for treatment or other patient management decisions.  A negative result may occur with improper specimen collection / handling, submission of specimen other than nasopharyngeal swab, presence of viral mutation(s) within the areas targeted by this assay, and inadequate number of viral copies (<250 copies / mL). A negative result must be combined with clinical observations, patient history, and epidemiological information.  Fact Sheet for Patients:   StrictlyIdeas.no  Fact Sheet for Healthcare Providers: BankingDealers.co.za  This test is not yet approved or  cleared by the [place] FDA and has been authorized for detection and/or diagnosis of SARS-CoV-2 by FDA under an Emergency Use Authorization (EUA).  This EUA will remain in effect (meaning this test can be used) for  the duration of the COVID-19 declaration under Section 564(b)(1) of the Act, 21 U.S.C. section 360bbb-3(b)(1), unless the authorization is terminated or revoked sooner.  Performed at [facility], [place]  [address], [place], [place] [address]      Time coordinating discharge: 35 minutes  SIGNED:   Jonnie Finner, DO  [facility] 06/26/2020, 10:46 AM   If 7PM-7AM, please contact night-coverage [email or web address]

## 2020-06-26 NOTE — Progress Notes (Signed)
Progress Note  Patient Name: Debra Ware Date of Encounter: 06/26/2020  Primary Cardiologist: Fransico Him, MD  Subjective   Feeling well for dc today  Inpatient Medications    Scheduled Meds: . alteplase  2 mg Intracatheter Once  . apixaban  5 mg Oral BID  . Chlorhexidine Gluconate Cloth  6 each Topical Daily  . docusate sodium  100 mg Oral BID  . loratadine  10 mg Oral Daily  . magnesium oxide  400 mg Oral Daily  . metoprolol tartrate  25 mg Oral BID  . potassium chloride SA  20 mEq Oral BID  . sodium chloride flush  10-40 mL Intracatheter Q12H  . torsemide  20 mg Oral BID   Continuous Infusions:  PRN Meds: acetaminophen **OR** acetaminophen, albuterol, diphenhydrAMINE-zinc acetate, fluticasone, hydrALAZINE, ondansetron **OR** ondansetron (ZOFRAN) IV, polyethylene glycol, sodium chloride flush   Vital Signs    Vitals:   06/25/20 1645 06/25/20 2116 06/26/20 0627 06/26/20 0914  BP: 125/74 (!) 131/65 (!) 142/83 (!) 139/75  Pulse: 70 72 63 80  Resp: 16 18 20    Temp: 98 F (36.7 C) 98.6 F (37 C) 98 F (36.7 C)   TempSrc: Oral Oral Oral   SpO2: 99% 99% 99%   Weight:   (!) 149.6 kg   Height:        Intake/Output Summary (Last 24 hours) at 06/26/2020 1137 Last data filed at 06/26/2020 0918 Gross per 24 hour  Intake 250 ml  Output 1200 ml  Net -950 ml   Last 3 Weights 06/26/2020 06/25/2020 06/24/2020  Weight (lbs) 329 lb 14.4 oz 330 lb 11.2 oz 334 lb  Weight (kg) 149.642 kg 150.005 kg 151.501 kg     Telemetry    NSR with frequent PVCs - Personally Reviewed  Physical Exam   GEN: No acute distress.   Neck: No JVD Cardiac: irregular rhythm, normal rate, no murmurs, rubs, or gallops.  Respiratory: Clear to auscultation bilaterally. GI: Soft, nontender, non-distended  MS: 3+ dorsal foot edema 1-2+ leg edema; No deformity. Neuro:  Nonfocal  Psych: Normal affect  Labs    High Sensitivity Troponin:   Recent Labs  Lab 06/21/20 1710 06/21/20 2003   TROPONINIHS 75* 68*      Cardiac EnzymesNo results for input(s): TROPONINI in the last 168 hours. No results for input(s): TROPIPOC in the last 168 hours.   Chemistry Recent Labs  Lab 06/22/20 1520 06/23/20 1400 06/24/20 0500 06/25/20 0500 06/26/20 0500  NA  --    < > 137 138 137  K  --    < > 4.0 3.8 3.6  CL  --    < > 103 100 99  CO2  --    < > 27 28 28   GLUCOSE  --    < > 114* 117* 115*  BUN  --    < > 11 10 14   CREATININE  --    < > 0.99 0.87 0.93  CALCIUM  --    < > 9.4 9.5 9.7  PROT 6.8  --  6.1*  --   --   ALBUMIN 3.6  --  3.5 3.5 3.6  AST 26  --  34  --   --   ALT 28  --  32  --   --   ALKPHOS 40  --  34*  --   --   BILITOT 1.1  --  1.1  --   --   GFRNONAA  --    < > >  60 >60 >60  GFRAA  --    < > >60 >60 >60  ANIONGAP  --    < > 7 10 10    < > = values in this interval not displayed.     Hematology Recent Labs  Lab 06/24/20 0500 06/25/20 0500 06/26/20 0500  WBC 8.0 8.4 8.6  RBC 4.31 4.26 4.46  HGB 12.2 12.2 12.9  HCT 39.9 39.0 40.7  MCV 92.6 91.5 91.3  MCH 28.3 28.6 28.9  MCHC 30.6 31.3 31.7  RDW 15.9* 15.7* 15.5  PLT 149* 147* 152    BNPNo results for input(s): BNP, PROBNP in the last 168 hours.   DDimer No results for input(s): DDIMER in the last 168 hours.   Radiology    No results found.  Cardiac Studies   2D echo 04/2020  1. Left ventricular ejection fraction, by estimation, is 60 to 65%. The  left ventricle has normal function. The left ventricle has no regional  wall motion abnormalities. Left ventricular diastolic parameters are  consistent with Grade II diastolic  dysfunction (pseudonormalization). Elevated left ventricular end-diastolic  pressure.  2. Right ventricular systolic function is normal. The right ventricular  size is normal. There is normal pulmonary artery systolic pressure.  3. Left atrial size was mildly dilated.  4. The mitral valve is normal in structure. Trivial mitral valve  regurgitation. No evidence of  mitral stenosis.  5. The aortic valve is tricuspid. Aortic valve regurgitation is not  visualized. No aortic stenosis is present.  6. Aortic dilatation noted. 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  7. The inferior vena cava is normal in size with greater than 50%  respiratory variability, suggesting right atrial pressure of 3 mmHg.   Patient Profile     54 y.o. female with history of metastatic cervical cancer on chemotherapy, morbid obesity, HTN, DM, diastolic dysfunction, atrial fib 08/2019 initially felt r/t paclitaxel, outpatient CT 09/2019 without CAD. She had episode of CP/palpitations in 08/2019, diagnosed with AF RVR, converted to NSR with IV Lopressor. CHADSVASc was felt to be 1 and AF felt related to possibly be related to paclitaxel treatment, though it was recommended that unless she had frequent episodes, Paclitaxel was to be continued. Outpt coronary CT 09/2019 without CAD. Shewas seen back in clinic by Dr. [name] 03/2020 and washaving problems with DOE as well as chest pressure. [place] [facility] [place] and was placed on steroids and unfortunately hadgained over 80lbs. HerSOB hadgotten progressively worse and had worsening LE pitting edema. Lasix was increased. RepeatTTE showed a preserved EF of 60-65%, G2DD, and mild LAE. LE venous dopplers negative for DVT and VQ scan showed normal perfusion. . She has lung mets from her cervical CA and extensive adenopathy in the abdominal and pelvic cavities. She was seen in clinic 06/21/20 with increasing LE edema, nonhealing ulcers, diaphoresis and tachycardia and found to be in 2:1 typical atrial flutter. She spontaneously converted to NSR. Initial ED work-up notable for troponins of 75->68 and CTA chest that was negative for PE or pulmonary disease (no metastatic disease noted). There was however moderate to severe dextroscoliosis.   Assessment & Plan    1. 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 Should have a BMET in 7-10 days with PCP  2. Paroxysmal atrial fib/flutter, also with PACs/PVCs and brief NSVT (4 beats) on telemetry  - in NSR - continue Eliquis - metoprolol was changed to tartrate (she takes succinate at home). She is borderline bradycardic today on metoprolol tartrate 25 mg BID. She would eventually like to go back to metoprolol succinate, but for now since she is doing well hemodynamically we will stay on tartrate and she can transition back at CV/EP follow up.  - patient and I discussed flecanide. She would like to defer "if not absolutely necessary" since she is maintaining SR currently, can discuss further at EP follow up.  - pt reports prior outpatient sleep study with mild OSA borderline for needing CPAP usage, weight management pursued - MagOx at 400mg  daily dose should continue at home.   3. Essential HTN  - controlled  4. Metastatic cervical cancer - [facility] [place]  5. Elevated troponin - hsTroponin 75-68 - relatively low/flat. No symptoms to suggest ACS. Recent coronary CTA was without CAD, therefore likely demand ischemia. Continue beta blocker.  For questions or updates, please contact [place] Please consult [email or web address] for contact info under Cardiology/STEMI.  Signed, Elouise Munroe, MD

## 2020-07-19 NOTE — Progress Notes (Deleted)
Cardiology Office Note    Date:  07/19/2020   ID:  Manaia Samad, DOB 21-Jul-1966, MRN [id number]  PCP:  [name], FNP  Cardiologist: Fransico Him, MD EPS: None  No chief complaint on file.   History of Present Illness:  Debra Ware is a 54 y.o. female with history of metastatic cervical cancer on chemotherapy, morbid obesity, HTN, DM, diastolic dysfunction, atrial fib 08/2019 initially felt r/t paclitaxel, outpatient CT 09/2019 without CAD. She had episode of CP/palpitations in 08/2019, diagnosed with AF RVR, converted to NSR with IV Lopressor. CHADSVASc was felt to be 1 and AF felt related to possibly be related to paclitaxel treatment, though it was recommended that unless she had frequent episodes, Paclitaxel was to be continued   She was seen back in clinic by Dr. [name] 03/2020 and was having problems with DOE as well as chest pressure.  She is being followed at the [facility] and was placed on steroids and unfortunately had gained over 80lbs. Her SOB had gotten progressively worse and  had worsening LE pitting edema. Lasix was increased. Repeat TTE showed a preserved EF of 60-65%, G2DD, and mild LAE. LE venous dopplers negative for DVT and VQ scan showed normal perfusion. . She has lung mets from her cervical CA and extensive adenopathy in the abdominal and pelvic cavities. She was seen in clinic 06/21/20 with increasing LE edema, nonhealing ulcers, diaphoresis and tachycardia and found to be in 2:1 typical atrial flutter. She spontaneously converted to NSR. Initial ED work-up notable for troponins of 75->68 and CTA chest that was negative for PE or pulmonary disease (no metastatic disease noted). There was however moderate to severe dextroscoliosis.    Patient discharged from the hospital 06/26/2020 with acute on chronic diastolic CHF partly due to malignant lymphatic obstruction, metastatic disease, severe dextroscoliosis and morbid obesity.  She was discharged on  torsemide 20 mg twice daily.  She also continues to have PAF with PACs PVCs and NSVT on Eliquis added and metoprolol.  They did discuss flecainide but she wanted to hold off.  Past Medical History:  Diagnosis Date  . Acute diastolic CHF (congestive heart failure) ([place]) 06/22/2020  . Asthma   . Atrial fibrillation with RVR ([place])   . Cancer ([place])   . 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 Hypertension   . Metastatic cancer ([place])   . Morbid obesity with BMI of 40.0-44.9, adult ([place]) 06/16/2019  . Ovarian cancer ([place])   . PAF (paroxysmal atrial fibrillation) ([place]) 08/16/2019  . Palpitations 08/17/2019  . 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  Current Medications: No outpatient medications have been marked as taking for the 07/20/20 encounter (Appointment) with Imogene Burn, PA-C.     Allergies:   Azithromycin, Metformin and related, Penicillins, and Sulfa antibiotics   Social History   Socioeconomic History  . Marital status: Single    Spouse name: Not on file  . Number of children: Not on file  . Years of education: Not on file  . Highest education level: Not on file  Occupational History  . Not on file  Tobacco Use  . Smoking status: Never Smoker  . Smokeless tobacco: Never Used  Vaping Use  . Vaping Use: Never used  Substance and Sexual Activity  .  Alcohol use: Yes    Comment: occasionally  . Drug use: Never  . Sexual activity: Not Currently  Other Topics Concern  . Not on file  Social History Narrative  . Not on file   Social Determinants of Health   Financial Resource Strain:   . Difficulty of Paying Living Expenses:   Food Insecurity:   . Worried About Charity fundraiser in the Last Year:   . Arboriculturist in the Last Year:   Transportation  Needs:   . Film/video editor (Medical):   Marland Kitchen Lack of Transportation (Non-Medical):   Physical Activity:   . Days of Exercise per Week:   . Minutes of Exercise per Session:   Stress:   . Feeling of Stress :   Social Connections:   . Frequency of Communication with Friends and Family:   . Frequency of Social Gatherings with Friends and Family:   . Attends Religious Services:   . Active Member of Clubs or Organizations:   . Attends Archivist Meetings:   Marland Kitchen Marital Status:      Family History:  The patient's ***family history includes Diabetes in her paternal grandfather; Heart attack in her father; Heart disease in her father and paternal grandfather; Hypertension in her paternal grandfather; Lung cancer in her father; Throat cancer in her father.   ROS:   Please see the history of present illness.    ROS All other systems reviewed and are negative.   PHYSICAL EXAM:   VS:  There were no vitals taken for this visit.  Physical Exam  GEN: Well nourished, well developed, in no acute distress  HEENT: normal  Neck: no JVD, carotid bruits, or masses Cardiac:RRR; no murmurs, rubs, or gallops  Respiratory:  clear to auscultation bilaterally, normal work of breathing GI: soft, nontender, nondistended, + BS Ext: without cyanosis, clubbing, or edema, Good distal pulses bilaterally MS: no deformity or atrophy  Skin: warm and dry, no rash Neuro:  Alert and Oriented x 3, Strength and sensation are intact Psych: euthymic mood, full affect  Wt Readings from Last 3 Encounters:  06/26/20 (!) 329 lb 14.4 oz (149.6 kg)  06/21/20 (!) 344 lb 3.2 oz (156.1 kg)  04/27/20 (!) 337 lb 12.8 oz (153.2 kg)      Studies/Labs Reviewed:   EKG:  EKG is*** ordered today.  The ekg ordered today demonstrates ***  Recent Labs: 08/15/2019: TSH 1.718 04/27/2020: NT-Pro BNP 321 06/24/2020: ALT 32 06/26/2020: BUN 14; Creatinine, Ser 0.93; Hemoglobin 12.9; Magnesium 1.8; Platelets 152; Potassium  3.6; Sodium 137   Lipid Panel    Component Value Date/Time   CHOL 137 08/16/2019 1247   TRIG 72 08/16/2019 1247   HDL 38 (L) 08/16/2019 1247   CHOLHDL 3.6 08/16/2019 1247   VLDL 14 08/16/2019 1247   Primera 85 08/16/2019 1247    Additional studies/ records that were reviewed today include:  2D echo 04/2020   1. Left ventricular ejection fraction, by estimation, is 60 to 65%. The  left ventricle has normal function. The left ventricle has no regional  wall motion abnormalities. Left ventricular diastolic parameters are  consistent with Grade II diastolic  dysfunction (pseudonormalization). Elevated left ventricular end-diastolic  pressure.   2. Right ventricular systolic function is normal. The right ventricular  size is normal. There is normal pulmonary artery systolic pressure.   3. Left atrial size was mildly dilated.   4. The mitral valve is normal in structure. Trivial mitral valve  regurgitation. No evidence of mitral stenosis.   5. The aortic valve is tricuspid. Aortic valve regurgitation is not  visualized. No aortic stenosis is present.   6. Aortic dilatation noted. 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   7. The inferior vena cava is normal in size with greater than 50%  respiratory variability, suggesting right atrial pressure of 3 mmHg.        ASSESSMENT:    1. Chronic diastolic CHF (congestive heart failure) (HCC)   2. Paroxysmal atrial fibrillation ([place])   3. Essential hypertension   4. Cancer of endocervical canal (HCC)      PLAN:  In order of problems listed above:  Chronic diastolic CHF with chronic lower extremity edema partially due to malignant lymphatic obstruction, metastatic disease, severe dextroscoliosis morbid obesity  PAF/flutter-at least 2 incidences, PACs PVCs and NSVT on metoprolol and Eliquis-added last admission.  Flecainide discussed but patient wanted to defer.   Essential hypertension  Metastatic  cervical cancer followed at cancer treatment center in [facility]  Medication Adjustments/Labs and Tests Ordered: Current medicines are reviewed at length with the patient today.  Concerns regarding medicines are outlined above.  Medication changes, Labs and Tests ordered today are listed in the Patient Instructions below. There are no Patient Instructions on file for this visit.   Sumner Boast, PA-C  07/19/2020 12:43 PM    [facility] [address] [place], [place], [place]  [address] Phone: [phone]; Fax: [phone]

## 2020-07-20 ENCOUNTER — Ambulatory Visit: Payer: BC Managed Care – PPO | Admitting: Physician Assistant

## 2020-07-22 ENCOUNTER — Other Ambulatory Visit: Payer: Self-pay

## 2020-07-22 MED ORDER — APIXABAN 5 MG PO TABS: 5.0000 mg | ORAL_TABLET | Freq: Two times a day (BID) | ORAL | 5 refills | Status: DC

## 2020-07-22 NOTE — Telephone Encounter (Signed)
Pt last saw Dr [name] 06/21/20, last labs 06/24/20 Creat 0.99, age 54, weight 149.6kg, based on specified criteria pt is on appropriate dosage of Eliquis 5mg  BID.  Will refill rx.

## 2020-07-27 ENCOUNTER — Ambulatory Visit: Payer: BC Managed Care – PPO | Admitting: Student

## 2020-07-29 MED ORDER — TORSEMIDE 20 MG PO TABS: 20.0000 mg | ORAL_TABLET | Freq: Two times a day (BID) | ORAL | 11 refills | Status: DC

## 2020-08-02 ENCOUNTER — Ambulatory Visit (INDEPENDENT_AMBULATORY_CARE_PROVIDER_SITE_OTHER): Payer: BC Managed Care – PPO | Admitting: Student

## 2020-08-02 ENCOUNTER — Other Ambulatory Visit: Payer: Self-pay

## 2020-08-02 ENCOUNTER — Encounter: Payer: Self-pay | Admitting: Student

## 2020-08-02 VITALS — BP 118/74 | HR 94 | Ht 70.0 in | Wt 337.0 lb

## 2020-08-02 DIAGNOSIS — R600 Localized edema: Secondary | ICD-10-CM

## 2020-08-02 DIAGNOSIS — R002 Palpitations: Secondary | ICD-10-CM

## 2020-08-02 DIAGNOSIS — I10 Essential (primary) hypertension: Secondary | ICD-10-CM | POA: Diagnosis not present

## 2020-08-02 DIAGNOSIS — I480 Paroxysmal atrial fibrillation: Secondary | ICD-10-CM

## 2020-08-02 NOTE — Progress Notes (Signed)
PCP:  [name], FNP Primary Cardiologist: Fransico Him, MD Electrophysiologist: Dr. Lavell Luster is a 54 y.o. female seen today for Dr. Curt Bears for post hospital follow up.  Since discharge from hospital the patient reports doing very well. She feels better on torsemide, feels like it has helped her chronic edema. She denies SOB, chest pain, lightheadedness, dizziness, syncope, or tachy-palpitations. She leaves tomorrow for her trip to [facility] for chemotherapy (goes every three weeks). She thinks she had a reaction to her last session, so they are re-structuring her plan at this visit.    Past Medical History:  Diagnosis Date  . Acute diastolic CHF (congestive heart failure) ([place]) 06/22/2020  . Asthma   . Atrial fibrillation with RVR ([place])   . Cancer ([place])   . 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 Hypertension   . Metastatic cancer ([place])   . Morbid obesity with BMI of 40.0-44.9, adult ([place]) 06/16/2019  . Ovarian cancer ([place])   . PAF (paroxysmal atrial fibrillation) ([place]) 08/16/2019  . Palpitations 08/17/2019  . 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  Current Outpatient Medications  Medication Sig Dispense Refill  . acetaminophen (TYLENOL) 650 MG CR tablet Take 650 mg by mouth daily as needed for pain.    Marland Kitchen albuterol (VENTOLIN HFA) 108 (90 Base) MCG/ACT inhaler Inhale 1 puff into the lungs every 6 (six) hours as needed for wheezing or shortness of breath.     . Amino Acids (L-CARNITINE) LIQD Take 15 mLs by mouth 2 (two) times daily.     Marland Kitchen apixaban (ELIQUIS) 5 MG TABS tablet Take 1 tablet (5 mg total) by mouth 2 (two) times daily. 60 tablet 5  . APPLE CIDER VINEGAR PO Take 15 mLs by mouth daily.    . budesonide-formoterol (SYMBICORT) 160-4.5 MCG/ACT  inhaler Inhale 2 puffs into the lungs 2 (two) times daily.     . Cholecalciferol (VITAMIN D3) 125 MCG (5000 UT) CAPS Take 10,000 Units by mouth daily.    Marland Kitchen dexamethasone (DECADRON) 4 MG tablet Take 8 mg by mouth daily. Take for four days after chemo    . Digestive Enzymes (SIMILASE PO) Take 1 capsule by mouth daily.    . fluticasone (FLONASE) 50 MCG/ACT nasal spray Place 1 spray into both nostrils daily as needed for allergies or rhinitis.    Marland Kitchen L-Glutamine POWD Take 10 g by mouth daily. 1 scoop in juice.    . lidocaine-prilocaine (EMLA) cream Apply 1 application topically See admin instructions. 1 hour before chemo treatment.    Marland Kitchen loratadine (CLARITIN) 10 MG tablet Take 10 mg by mouth daily.    . magnesium oxide (MAG-OX) 400 MG tablet Take 1 tablet by mouth daily.    . metFORMIN (GLUCOPHAGE) 500 MG tablet Take 500 mg by mouth 2 (two) times daily.    . metoprolol tartrate (LOPRESSOR) 25 MG tablet Take 1 tablet (25 mg total) by mouth 2 (two) times daily. 60 tablet 0  . OVER THE COUNTER MEDICATION Take 1 capsule by mouth daily. Ferrasorb from [place]; iron with vitamin B and C     . OVER THE COUNTER MEDICATION Take 15 mLs by mouth daily. Seasame oil    . potassium chloride SA (KLOR-CON) 20 MEQ tablet Take 1 tablet (20 mEq total) by mouth daily.  Take with torsemide.    . pyridoxine (B-6) 100 MG tablet Take 200 mg by mouth daily.    Marland Kitchen torsemide (DEMADEX) 20 MG tablet Take 1 tablet (20 mg total) by mouth 2 (two) times daily. 60 tablet 11   No current facility-administered medications for this visit.    Allergies  Allergen Reactions  . Azithromycin Hives  . Metformin And Related Other (See Comments)    Per pt: makes her eat  . Penicillins Rash    Did it involve swelling of the face/tongue/throat, SOB, or low BP? Yes Did it involve sudden or severe rash/hives, skin peeling, or any reaction on the inside of your mouth or nose? No Did you need to seek medical attention at a hospital or doctor's  office? No When did it last happen?unknown  If all above answers are "NO", may proceed with cephalosporin use.;  . Sulfa Antibiotics Rash    Social History   Socioeconomic History  . Marital status: Single    Spouse name: Not on file  . Number of children: Not on file  . Years of education: Not on file  . Highest education level: Not on file  Occupational History  . Not on file  Tobacco Use  . Smoking status: Never Smoker  . Smokeless tobacco: Never Used  Vaping Use  . Vaping Use: Never used  Substance and Sexual Activity  . Alcohol use: Yes    Comment: occasionally  . Drug use: Never  . Sexual activity: Not Currently  Other Topics Concern  . Not on file  Social History Narrative  . Not on file   Social Determinants of Health   Financial Resource Strain:   . Difficulty of Paying Living Expenses: Not on file  Food Insecurity:   . Worried About Charity fundraiser in the Last Year: Not on file  . Ran Out of Food in the Last Year: Not on file  Transportation Needs:   . Lack of Transportation (Medical): Not on file  . Lack of Transportation (Non-Medical): Not on file  Physical Activity:   . Days of Exercise per Week: Not on file  . Minutes of Exercise per Session: Not on file  Stress:   . Feeling of Stress : Not on file  Social Connections:   . Frequency of Communication with Friends and Family: Not on file  . Frequency of Social Gatherings with Friends and Family: Not on file  . Attends Religious Services: Not on file  . Active Member of Clubs or Organizations: Not on file  . Attends Archivist Meetings: Not on file  . Marital Status: Not on file  Intimate Partner Violence:   . Fear of Current or Ex-Partner: Not on file  . Emotionally Abused: Not on file  . Physically Abused: Not on file  . Sexually Abused: Not on file     Review of Systems: All other systems reviewed and are otherwise negative except as noted above.  Physical Exam: Vitals:    08/02/20 1041  BP: 118/74  Pulse: 94  SpO2: 94%  Weight: (!) 337 lb (152.9 kg)  Height: 5\' 10"  (1.778 m)    GEN- The patient is well appearing, alert and oriented x 3 today.   HEENT: normocephalic, atraumatic; sclera clear, conjunctiva pink; hearing intact; oropharynx clear; neck supple, no JVP Lymph- no cervical lymphadenopathy Lungs- Clear to ausculation bilaterally, normal work of breathing.  No wheezes, rales, rhonchi Heart- Regular rate and rhythm, no murmurs, rubs or gallops,  PMI not laterally displaced GI- soft, non-tender, non-distended, bowel sounds present, no hepatosplenomegaly Extremities- no clubbing or cyanosis. 1-2+ bilateral edema half way to knees (chronic); DP/PT/radial pulses 2+ bilaterally MS- no significant deformity or atrophy Skin- warm and dry, no rash or lesion Psych- euthymic mood, full affect Neuro- strength and sensation are intact  EKG is not ordered.   Additional studies reviewed include: Previous admission notes.   Assessment and Plan:  1.  Paroxysmal atrial fib/flutter Has at least 2 incidents, but converted spontaneously.  Continue eliquis for CHA2DS2VASC of at least 3   (also note hypercoagulable state with metastatic CA.) Switched from lasix to torsemide at recent admission.  Continue lopressor 25 mg BID.  Will discuss utility of AAD with Dr. Curt Bears. Normal EF and no known CAD, so could be candidate for low dose flecainide. We suggested this at last admission but patient preferred to defer for the time being. .   2. Stage IV Cervical cancer She is getting regular treatments in [place].   3. HTN Continue current medications  4. Chronic diastolic CHF Echo 3/96/8864 showed LVEF 60-65% Feels better on torsemide.  She is going to [facility] tomorrow and always gets "a bunch of labwork" we will send a written request for BMET/Mg to be drawn and results faxed to [place].   RTC 6 months. Sooner with further tachycardia symptoms.   Shirley Friar, PA-C  08/02/20 10:54 AM

## 2020-08-02 NOTE — Patient Instructions (Signed)
Medication Instructions:  Your physician recommends that you continue on your current medications as directed. Please refer to the Current Medication list given to you today.  *If you need a refill on your cardiac medications before your next appointment, please call your pharmacy*   Lab Work: Paper order for BMET and MAGNESIUM labs drawn. If you have labs (blood work) drawn today and your tests are completely normal, you will receive your results only by: Marland Kitchen MyChart Message (if you have MyChart) OR . A paper copy in the mail If you have any lab test that is abnormal or we need to change your treatment, we will call you to review the results.   Testing/Procedures: None today   Follow-Up: At [facility], you and your health needs are our priority.  As part of our continuing mission to provide you with exceptional heart care, we have created designated Provider Care Teams.  These Care Teams include your primary Cardiologist (physician) and Advanced Practice Providers (APPs -  Physician Assistants and Nurse Practitioners) who all work together to provide you with the care you need, when you need it.  We recommend signing up for the patient portal called "MyChart".  Sign up information is provided on this After Visit Summary.  MyChart is used to connect with patients for Virtual Visits (Telemedicine).  Patients are able to view lab/test results, encounter notes, upcoming appointments, etc.  Non-urgent messages can be sent to your provider as well.   To learn more about what you can do with MyChart, go to [email or web address].    Your next appointment:   6 month(s)  The format for your next appointment:   In Person  Provider:   Dr. Curt Bears

## 2020-08-18 ENCOUNTER — Encounter: Payer: Self-pay | Admitting: Physician Assistant

## 2020-08-18 NOTE — Progress Notes (Signed)
Cardiology Office Note    Date:  08/20/2020   ID:  Debra Ware, DOB 1965/12/30, MRN [id number]  PCP:  [name], FNP  Cardiologist:  Fransico Him, MD  Electrophysiologist:  None   Chief Complaint: f/u edema/fluid overload and atrial fib/flutter  History of Present Illness:   Debra Ware is a 54 y.o. female with history of metastatic cervical cancer previously on chemotherapy (now on immunotherapy), morbid obesity, HTN, diastolic dysfunction, atrial fibrillation, outpatient CT 09/2019 without CAD, mild dilation of ascending aorta, lower extremity edema (likely multifactorial) with chronic diastolic CHF, recently diagnosed atrial flutter who presents for post-hospital follow-up.  To recap history, she had episode of chest pain/palpitations in 08/2019 at which time she was found to have atrial fibrillation with RVR, converted to NSR with IV Lopressor. CHADSVASc was felt to be 1 at that time and AF felt related to possibly be related to paclitaxel treatment, though it was recommended that unless she had frequent episodes, Paclitaxel was to be continued. Outpt coronary CT 09/2019 was without CAD. More recently this year she has been followed for worsening dyspnea and edema. She is concomitantly followed at the [facility] and was placed on steroids and unfortunately had gained over 80lbs. She also has metastasis from her cervical CA and extensive adenopathy in the abdominal and pelvic cavities. F/u TTE 04/2020 showed a preserved EF of 60-65%, G2DD, and mild LAE. LE venous dopplers were negative for DVT and VQ scan showed normal perfusion. She was seen in clinic 06/21/20 with increasing LE edema, nonhealing ulcers, diaphoresis and tachycardia and found to be in 2:1 typical atrial flutter so was sent to the hospital for evaluation. She spontaneously converted to NSR with PACs/PVCs and brief NSVT (4 beats). Workup was also notable for mildly elevated hsTroponin 75->68 felt due to  demand process, not ACS. CTA chest was negative for PE or pulmonary disease (no metastatic disease noted). There was moderate to severe dextroscoliosis. She was diuresed with IV Lasix and transitioned to torsemide at discharge. Edema was felt multifactorial from chronic diastolic CHF, malignant lymphatic obstruction, metastatic disease, severe dextroscoliosis and morbid obesity. She was also placed on Eliquis for anticoagulation (CHADSVASC 3 for diastolic CHF, HTN, female). She saw EP who offered flecainide but she wished to defer addition. She reports a recent sleep study showed mild findings that did not require treatment.  She presents for follow-up and is overall doing well. She was in [place] recently for follow-up and they discontinued chemotherapy and started immunotherapy with Keytruda. She feels edema has been better controlled. She has not felt any unusual palpitations. She has not seen any bleeding but has noticed occasional green stool and wonders if this could be related to her recent medicine changes. She has chronic dyspnea.   Labwork independently reviewed: 08/04/20 Hgb 12.4, Plt 206, albumin 4.0, glucose 133, BUN 11, Cr 0.83, K 3.2, CO2 30, TSH wnl   Past Medical History:  Diagnosis Date  . Asthma   . Atrial flutter ([place])   . 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 Chronic diastolic CHF (congestive heart failure) ([place])   . Hypertension   . Metastatic cancer ([place])   . Mild dilation of ascending aorta ([facility])   . Morbid obesity with BMI of 40.0-44.9, adult ([place]) 06/16/2019  . Ovarian cancer ([place])   . PAF (paroxysmal atrial fibrillation) ([place]) 08/16/2019  . 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 No pertinent surgical history.  Current Medications: Current Meds  Medication Sig  .  acetaminophen (TYLENOL) 650 MG CR tablet Take 650 mg by mouth daily as needed for pain.  Marland Kitchen albuterol (VENTOLIN HFA) 108 (90 Base) MCG/ACT inhaler Inhale 1 puff into the lungs every 6 (six) hours as needed for wheezing or shortness of breath.   . Amino Acids (L-CARNITINE) LIQD Take 15 mLs by mouth as needed.   Marland Kitchen apixaban (ELIQUIS) 5 MG TABS tablet Take 1 tablet (5 mg total) by mouth 2 (two) times daily.  . APPLE CIDER VINEGAR PO Take 15 mLs by mouth as needed.   . budesonide-formoterol (SYMBICORT) 160-4.5 MCG/ACT inhaler Inhale 2 puffs into the lungs 2 (two) times daily.   . Cholecalciferol (VITAMIN D3) 125 MCG (5000 UT) CAPS Take 10,000 Units by mouth daily.  . Digestive Enzymes (SIMILASE PO) Take 1 capsule by mouth daily.  . fluticasone (FLONASE) 50 MCG/ACT nasal spray Place 1 spray into both nostrils daily as needed for allergies or rhinitis.  Marland Kitchen L-Glutamine POWD Take 10 g by mouth as needed. 1 scoop in juice.  . lidocaine-prilocaine (EMLA) cream Apply 1 application topically See admin instructions. 1 hour before chemo treatment.  Marland Kitchen loratadine (CLARITIN) 10 MG tablet Take 10 mg by mouth daily.  . magnesium oxide (MAG-OX) 400 MG tablet Take 1 tablet by mouth daily.  . metFORMIN (GLUCOPHAGE) 500 MG tablet Take 500 mg by mouth 2 (two) times daily.  Marland Kitchen OVER THE COUNTER MEDICATION Take 1 capsule by mouth daily. Ferrasorb from [place]; iron with vitamin B and C   . OVER THE COUNTER MEDICATION Take 15 mLs by mouth as needed. Seasame oil   . potassium chloride (KLOR-CON) 10 MEQ tablet Take 10 mEq by mouth daily.  Marland Kitchen pyridoxine (B-6) 100 MG tablet Take 200 mg by mouth daily.  Marland Kitchen torsemide (DEMADEX) 20 MG tablet Take 1 tablet (20 mg total) by mouth 2 (two) times daily.     Allergies:   Azithromycin, Metformin and related, Penicillins, and Sulfa antibiotics   Social History   Socioeconomic History  . Marital status: Single    Spouse name: Not on file  . Number of children: Not on file  . Years of  education: Not on file  . Highest education level: Not on file  Occupational History  . Not on file  Tobacco Use  . Smoking status: Never Smoker  . Smokeless tobacco: Never Used  Vaping Use  . Vaping Use: Never used  Substance and Sexual Activity  . Alcohol use: Yes    Comment: occasionally  . Drug use: Never  . Sexual activity: Not Currently  Other Topics Concern  . Not on file  Social History Narrative  . Not on file   Social Determinants of Health   Financial Resource Strain:   . Difficulty of Paying Living Expenses: Not on file  Food Insecurity:   . Worried About Charity fundraiser in the Last Year: Not on file  . Ran Out of Food in the Last Year: Not on file  Transportation Needs:   . Lack of Transportation (Medical): Not on file  . Lack of Transportation (Non-Medical): Not on file  Physical Activity:   . Days of Exercise per Week: Not on file  . Minutes of Exercise per Session: Not on file  Stress:   . Feeling of Stress : Not on file  Social Connections:   . Frequency of Communication with Friends and Family: Not on file  . Frequency of Social Gatherings with Friends and Family: Not on file  . Attends Religious Services: Not on file  . Active Member of Clubs or Organizations: Not on file  . Attends Archivist Meetings: Not on file  . Marital Status: Not on file     Family History:  The patient's family history includes Diabetes in her paternal grandfather; Heart attack in her father; Heart disease in her father and paternal grandfather; Hypertension in her paternal grandfather; Lung cancer in her father; Throat cancer in her father. There is no history of Breast cancer, Ovarian cancer, or Colon cancer.  ROS:   Please see the history of present illness.  All other systems are reviewed and otherwise negative.     EKGs/Labs/Other Studies Reviewed:    Studies reviewed are outlined and summarized above. Reports included below if pertinent.  2D echo  04/2020 1. Left ventricular ejection fraction, by estimation, is 60 to 65%. The  left ventricle has normal function. The left ventricle has no regional  wall motion abnormalities. Left ventricular diastolic parameters are  consistent with Grade II diastolic  dysfunction (pseudonormalization). Elevated left ventricular end-diastolic  pressure.  2. Right ventricular systolic function is normal. The right ventricular  size is normal. There is normal pulmonary artery systolic pressure.  3. Left atrial size was mildly dilated.  4. The mitral valve is normal in structure. Trivial mitral valve  regurgitation. No evidence of mitral stenosis.  5. The aortic valve is tricuspid. Aortic valve regurgitation is not  visualized. No aortic stenosis is present.  6. Aortic dilatation noted. 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  7. The inferior vena cava is normal in size with greater than 50%  respiratory variability, suggesting right atrial pressure of 3 mmHg.     EKG:  EKG is not ordered today  Recent Labs: 04/27/2020: NT-Pro BNP 321 06/24/2020: ALT 32 06/26/2020: BUN 14; Creatinine, Ser 0.93; Hemoglobin 12.9; Magnesium 1.8; Platelets 152; Potassium 3.6; Sodium 137  Recent Lipid Panel    Component Value Date/Time   CHOL 137 08/16/2019 1247   TRIG 72 08/16/2019 1247   HDL 38 (L) 08/16/2019 1247   CHOLHDL 3.6 08/16/2019 1247   VLDL 14 08/16/2019 1247   LDLCALC 85 08/16/2019 1247    PHYSICAL EXAM:    VS:  BP 124/74   Pulse 73   Ht 5' 10" (1.778 m)   Wt (!) 332 lb 12.8 oz (151 kg)   SpO2 97%   BMI 47.75 kg/m   BMI: Body mass index is 47.75 kg/m.  GEN: Well nourished, well developed WF, in no acute distress, BMI within obese range HEENT: normocephalic, atraumatic Neck: no JVD, carotid bruits, or masses Cardiac: RRR with occasional ectopy; no murmurs, rubs, or gallops, 1+ bilateral lower extremity (girth significantly decreased from when I met her in the  hospital) Respiratory:  clear to auscultation bilaterally, normal work of breathing GI: soft, nontender, nondistended, + BS MS: no deformity or atrophy Skin: warm and dry, no rash Neuro:  Alert and Oriented x 3, Strength and sensation are intact, follows commands Psych: euthymic mood, full affect  Wt Readings from Last 3 Encounters:  08/20/20 (!) 332 lb 12.8 oz (151 kg)  08/02/20 (!) 337 lb (152.9 kg)  06/26/20 (!) 329 lb 14.4 oz (149.6 kg)     ASSESSMENT & PLAN:   1. Paroxysmal  atrial flutter/paroxysmal atrial fibrillation - quiescent, exam consistent with NSR with rare ectopy. Continue metoprolol and Eliquis. I will confer with our pharmacy team regarding her green stool. Will check CBC to ensure stability. 2. Lower extremity edema with chronic diastolic CHF - appears much improved with transition to torsemide. Do not suspect that she will have complete resolution given her pelvic disease, so would continue present regimen and supplement electrolytes as needed. Recheck BMET/Mg today. She reports she's been taking KCl 59mq as recommended by the cancer center. May need more than this, was previously prescribed 230m. Weight down 5lb from prior values.  3. Essential HTN - controlled, no changes today. 4. Hypokalemia - recheck BMET/Mg today. 5. Mild dilation of ascending aorta - seen on recent echocardiogram. No specific intervention needed at present time. Can consider repeat echocardiogram next May if clinically indicated. Will defer to primary cardiologist.  Disposition: F/u with Dr. TuRadford Paxn 2-3 months in person. She also mentions she works at [employer] her oncologist had written that she may return to work soon. She indicates she struggles to stand for long periods of time at her job due to back pain which then causes sweating. She also has chronic dyspnea as well. From a cardiac standpoint her diagnoses are not typically prohibitive of work but we typically recommend to remain as  active as possible but her functional status is clearly declined based on her other comorbidities. I have encouraged her to reach back out to her oncologist to discuss further, but if we need to provide documentation that she would benefit from lighter duty or less standing, would be happy to provide.  Medication Adjustments/Labs and Tests Ordered: Current medicines are reviewed at length with the patient today.  Concerns regarding medicines are outlined above. Medication changes, Labs and Tests ordered today are summarized above and listed in the Patient Instructions accessible in Encounters.   Signed, DaCharlie PitterPA-C  08/20/2020 11:44 AM    [email or web address]  [id number]: [phone]: [phone]

## 2020-08-20 ENCOUNTER — Other Ambulatory Visit: Payer: Self-pay

## 2020-08-20 ENCOUNTER — Encounter: Payer: Self-pay | Admitting: Physician Assistant

## 2020-08-20 ENCOUNTER — Telehealth: Payer: Self-pay | Admitting: Physician Assistant

## 2020-08-20 ENCOUNTER — Encounter: Payer: Self-pay | Admitting: *Deleted

## 2020-08-20 ENCOUNTER — Ambulatory Visit (INDEPENDENT_AMBULATORY_CARE_PROVIDER_SITE_OTHER): Payer: BC Managed Care – PPO | Admitting: Physician Assistant

## 2020-08-20 VITALS — BP 124/74 | HR 73 | Ht 70.0 in | Wt 332.8 lb

## 2020-08-20 DIAGNOSIS — E876 Hypokalemia: Secondary | ICD-10-CM

## 2020-08-20 DIAGNOSIS — I480 Paroxysmal atrial fibrillation: Secondary | ICD-10-CM | POA: Diagnosis not present

## 2020-08-20 DIAGNOSIS — I4892 Unspecified atrial flutter: Secondary | ICD-10-CM

## 2020-08-20 DIAGNOSIS — R600 Localized edema: Secondary | ICD-10-CM

## 2020-08-20 DIAGNOSIS — I5032 Chronic diastolic (congestive) heart failure: Secondary | ICD-10-CM | POA: Diagnosis not present

## 2020-08-20 DIAGNOSIS — I77810 Thoracic aortic ectasia: Secondary | ICD-10-CM

## 2020-08-20 DIAGNOSIS — I10 Essential (primary) hypertension: Secondary | ICD-10-CM

## 2020-08-20 NOTE — Telephone Encounter (Signed)
   Please call patient and let her know I conferred with 2 of our pharmacists about whether torsemide/Eliquis could cause green stool. Both pharmacists had not seen this previously described in literature. This side effect can be described when patients have biliary issues or infections in the GI tract so would recommend she touch base with her oncology team to make sure they are aware and get their input. Thanks!  Orvilla Truett PA-C

## 2020-08-20 NOTE — Telephone Encounter (Signed)
Call placed to pt regarding phone note below.  I have left the pt a message to call back and will send her a message on mychart.

## 2020-08-20 NOTE — Patient Instructions (Addendum)
Medication Instructions:  Your physician recommends that you continue on your current medications as directed. Please refer to the Current Medication list given to you today.  *If you need a refill on your cardiac medications before your next appointment, please call your pharmacy*   Lab Work: TODAY:  BMET, CBC, & MAGNESIUM  If you have labs (blood work) drawn today and your tests are completely normal, you will receive your results only by: Marland Kitchen MyChart Message (if you have MyChart) OR . A paper copy in the mail If you have any lab test that is abnormal or we need to change your treatment, we will call you to review the results.   Testing/Procedures: None ordered   Follow-Up: At [facility], you and your health needs are our priority.  As part of our continuing mission to provide you with exceptional heart care, we have created designated Provider Care Teams.  These Care Teams include your primary Cardiologist (physician) and Advanced Practice Providers (APPs -  Physician Assistants and Nurse Practitioners) who all work together to provide you with the care you need, when you need it.  We recommend signing up for the patient portal called "MyChart".  Sign up information is provided on this After Visit Summary.  MyChart is used to connect with patients for Virtual Visits (Telemedicine).  Patients are able to view lab/test results, encounter notes, upcoming appointments, etc.  Non-urgent messages can be sent to your provider as well.   To learn more about what you can do with MyChart, go to [email or web address].    Your next appointment:   2-3 month(s)  The format for your next appointment:   In Person  Provider:   You may see Fransico Him, MD or one of the following Advanced Practice Providers on your designated Care Team:    Melina Copa, PA-C  Ermalinda Barrios, PA-C    Other Instructions

## 2020-08-21 LAB — BASIC METABOLIC PANEL
BUN/Creatinine Ratio: 10 (ref 9–23)
BUN: 11 mg/dL (ref 6–24)
CO2: 27 mmol/L (ref 20–29)
Calcium: 9.8 mg/dL (ref 8.7–10.2)
Chloride: 98 mmol/L (ref 96–106)
Creatinine, Ser: 1.1 mg/dL — ABNORMAL HIGH (ref 0.57–1.00)
GFR calc Af Amer: 66 mL/min/{1.73_m2} (ref >59)
GFR calc non Af Amer: 57 mL/min/{1.73_m2} — ABNORMAL LOW (ref >59)
Glucose: 136 mg/dL — ABNORMAL HIGH (ref 65–99)
Potassium: 3.3 mmol/L — ABNORMAL LOW (ref 3.5–5.2)
Sodium: 142 mmol/L (ref 134–144)

## 2020-08-21 LAB — CBC
Hematocrit: 39.3 % (ref 34.0–46.6)
Hemoglobin: 13.3 g/dL (ref 11.1–15.9)
MCH: 29 pg (ref 26.6–33.0)
MCHC: 33.8 g/dL (ref 31.5–35.7)
MCV: 86 fL (ref 79–97)
Platelets: 179 10*3/uL (ref 150–450)
RBC: 4.59 x10E6/uL (ref 3.77–5.28)
RDW: 13.1 % (ref 11.7–15.4)
WBC: 8 10*3/uL (ref 3.4–10.8)

## 2020-08-21 LAB — MAGNESIUM: Magnesium: 1.4 mg/dL — ABNORMAL LOW (ref 1.6–2.3)

## 2020-08-23 ENCOUNTER — Telehealth: Payer: Self-pay

## 2020-08-23 NOTE — Telephone Encounter (Signed)
LMTCB regarding lab results.  

## 2020-08-23 NOTE — Telephone Encounter (Signed)
-----   Message from Charlie Pitter, [place] sent at 08/23/2020  8:06 AM EDT ----- Please let pt know few abnormalities on bloodwork but overall OK. Blood sugar mildly increased, kidney function mildly increased but not too far off from prior values. Potassium and magnesium are low. She has been taking KCl 6meq so would increase to 33meq BID (take with torsemide). Our list includes MagOx 400mg  daily so double check to see if she is taking. Would suggest increasing to 400mg  BID. Would recheck BMET/Mg in approximately 1 week after making these changes. Please increase dietary intake of healthy sources of potassium/magnesium including leafy greens, nuts, seeds, fish, beans, whole grains, avocados, yogurt, sweet potatoes, and bananas. Dayna Dunn PA-C

## 2020-08-24 ENCOUNTER — Telehealth: Payer: Self-pay | Admitting: *Deleted

## 2020-08-24 DIAGNOSIS — Z79899 Other long term (current) drug therapy: Secondary | ICD-10-CM

## 2020-08-24 MED ORDER — POTASSIUM CHLORIDE CRYS ER 20 MEQ PO TBCR
20.0000 meq | EXTENDED_RELEASE_TABLET | Freq: Two times a day (BID) | ORAL | 3 refills | Status: DC
Start: 2020-08-24 — End: 2022-01-27

## 2020-08-24 MED ORDER — MAGNESIUM OXIDE 400 MG PO TABS: 400.0000 mg | ORAL_TABLET | Freq: Two times a day (BID) | ORAL | 3 refills | Status: DC

## 2020-08-24 NOTE — Telephone Encounter (Signed)
Left pt a message to make sure she got my mychart message regarding below.

## 2020-08-24 NOTE — Telephone Encounter (Signed)
Spoke with pt and made her aware of the information below. Pt does confirm that she received the mychart message.

## 2020-08-24 NOTE — Telephone Encounter (Signed)
Pt has been made aware of her lab results.  See result note. 

## 2020-08-31 ENCOUNTER — Other Ambulatory Visit: Payer: BC Managed Care – PPO

## 2020-09-24 ENCOUNTER — Ambulatory Visit: Payer: BC Managed Care – PPO | Admitting: Family Medicine

## 2020-10-12 MED ORDER — METOPROLOL TARTRATE 25 MG PO TABS: 25.0000 mg | ORAL_TABLET | Freq: Two times a day (BID) | ORAL | 3 refills | Status: DC

## 2020-11-16 ENCOUNTER — Ambulatory Visit: Payer: Self-pay

## 2020-11-16 ENCOUNTER — Encounter: Payer: Self-pay | Admitting: Orthopaedic Surgery

## 2020-11-16 ENCOUNTER — Ambulatory Visit (INDEPENDENT_AMBULATORY_CARE_PROVIDER_SITE_OTHER): Payer: BLUE CROSS/BLUE SHIELD | Admitting: Orthopaedic Surgery

## 2020-11-16 VITALS — Ht 70.0 in | Wt 342.0 lb

## 2020-11-16 DIAGNOSIS — M25561 Pain in right knee: Secondary | ICD-10-CM | POA: Diagnosis not present

## 2020-11-16 DIAGNOSIS — G8929 Other chronic pain: Secondary | ICD-10-CM | POA: Diagnosis not present

## 2020-11-16 DIAGNOSIS — M23200 Derangement of unspecified lateral meniscus due to old tear or injury, right knee: Secondary | ICD-10-CM

## 2020-11-16 MED ORDER — LIDOCAINE HCL 1 % IJ SOLN
3.0000 mL | INTRAMUSCULAR | Status: AC | PRN
  Administered 2020-11-16: 3 mL

## 2020-11-16 MED ORDER — METHYLPREDNISOLONE ACETATE 40 MG/ML IJ SUSP
40.0000 mg | INTRAMUSCULAR | Status: AC | PRN
  Administered 2020-11-16: 40 mg via INTRA_ARTICULAR

## 2020-11-16 NOTE — Progress Notes (Signed)
Office Visit Note   Patient: Debra Ware           Date of Birth: 09-06-1966           MRN: [id number] Visit Date: 11/16/2020              Requested by: [name], [place],  [place] [id number] PCP: [name], FNP   Assessment & Plan: Visit Diagnoses:  1. Chronic pain of right knee   2. Old complex tear of lateral meniscus of right knee     Plan: We do want to try to stay as conservative as possible as it relates to her right knee and she agrees with this. I was able to aspirate about 10 to 20 cc of fluid from her knee which was clear and place a steroid injection in the right knee. I have advocated a knee sleeve for that side as well as gave her prescription for her to give her therapist to work on knee modalities that can strengthen her knees. All questions and concerns were answered and addressed. I would like to see her back in about 6 weeks. We want to avoid an arthroscopic intervention but that would be my next treatment step. She is a diabetic but reports good blood glucose control so she understands that the steroid injection may bump her blood sugars up. She did feel more comfortable after that injection.  Follow-Up Instructions: Return in about 6 weeks (around 12/28/2020).   Orders:  Orders Placed This Encounter  Procedures  . Large Joint Inj   No orders of the defined types were placed in this encounter.     Procedures: Large Joint Inj: R knee on 11/16/2020 10:54 AM Indications: diagnostic evaluation and pain Details: 22 G 1.5 in needle, superolateral approach  Arthrogram: No  Medications: 3 mL lidocaine 1 %; 40 mg methylPREDNISolone acetate 40 MG/ML Outcome: tolerated well, no immediate complications Procedure, treatment alternatives, risks and benefits explained, specific risks discussed. Consent was given by the patient. Immediately prior to procedure a time out was called to verify the correct patient, procedure, equipment,  support staff and site/side marked as required. Patient was prepped and draped in the usual sterile fashion.       Clinical Data: No additional findings.   Subjective: Chief Complaint  Patient presents with  . Right Knee - Pain  The patient is a 53 year old female who comes in for evaluation treatment of 2 months worth of knee pain. She says is worse about a month ago but is feeling a little bit better. A MRI has already been obtained of her right knee. She does ambulate using a crutch on that side. She denies any specific injury except for when she was working out with a physical therapist about a month or 2 months ago she did feel something happen to the right knee. She points around the patella tendon as a source of her pain but also laterally. She is someone who does have a BMI of 49 and weighs 342 lbs. She has been dealing with cervical cancer that had spread to her lungs. Right now she is in a state of where it has not spread any more and not gotten smaller. She is going through chemotherapy still.  HPI  Review of Systems She currently denies any headache or chest pain. She denies any fever, chills, nausea, vomiting she denies shortness of breath today  Objective: Vital Signs: Ht 5\' 10"  (1.778 m)  Wt (!) 342 lb (155.1 kg)   BMI 49.07 kg/m   Physical Exam She is alert and orient x3 and in no acute distress Ortho Exam Examination of both knees shows valgus alignment of both knees. Her right knee does have a mild effusion comparing the right and left knees. There is patella tenderness as well as patella tendon tenderness. Her extensor mechanism is intact. She does have lateral compartment pain but good range of motion of the knee. Specialty Comments:  No specialty comments available.  Imaging: No results found. The MRI of her knee does show severe patellofemoral arthritic changes. There is no cartilage changes of the medial lateral compartment and a small lateral meniscal  tear. There is also a small Baker's cyst.  PMFS History: Patient Active Problem List   Diagnosis Date Noted  . Atrial fibrillation ([facility] [facility]) 06/22/2020  . Paroxysmal atrial flutter ([place]) 06/22/2020  . Atrial flutter with rapid ventricular response ([place]) 06/22/2020  . HTN (hypertension) 06/22/2020  . Acute diastolic CHF (congestive heart failure) ([facility] [place]) 06/22/2020  . Palpitations 08/17/2019  . Atrial fibrillation with RVR ([place])   . Chest pain 08/16/2019  . PAF (paroxysmal atrial fibrillation) ([place]) 08/16/2019  . Cancer of endocervical canal ([place]) 06/16/2019  . Secondary malignant neoplasm of both lungs ([place]) 06/16/2019  . 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 Morbid obesity with BMI of 40.0-44.9, adult ([place]) 06/16/2019  . Secondary malignant neoplasm of intra-abdominal lymph nodes ([place]) 06/16/2019  . Cough 06/16/2019  . Pain in joint involving pelvic region and thigh 06/16/2019   Past Medical History:  Diagnosis Date  . Asthma   . Atrial flutter ([facility])   . Cancer of endocervical canal ([facility]) 06/16/2019  . Chronic diastolic CHF (congestive heart failure) ([place])   . Hypertension   . Metastatic cancer ([place] [facility])   . Mild dilation of ascending aorta ([facility])   . Morbid obesity with BMI of 40.0-44.9, adult ([place]) 06/16/2019  . Ovarian cancer ([place])   . PAF (paroxysmal atrial fibrillation) ([facility]) 08/16/2019  . Secondary malignant neoplasm of both lungs ([place]) 06/16/2019  . 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 Secondary malignant neoplasm of intra-abdominal lymph nodes ([facility]) 06/16/2019    Family History  Problem Relation Age of Onset  . Heart attack Father   . Lung cancer Father   . Throat cancer Father   . Heart disease Father   . Diabetes Paternal Grandfather   . Heart disease Paternal Grandfather   . Hypertension Paternal Grandfather   . Breast cancer Neg Hx   . Ovarian cancer Neg Hx   . Colon cancer Neg Hx     History reviewed. No  pertinent surgical history. Social History   Occupational History  . Not on file  Tobacco Use  . Smoking status: Never Smoker  . Smokeless tobacco: Never Used  Vaping Use  . Vaping Use: Never used  Substance and Sexual Activity  . Alcohol use: Yes    Comment: occasionally  . Drug use: Never  . Sexual activity: Not Currently

## 2020-11-29 ENCOUNTER — Ambulatory Visit: Payer: Self-pay | Admitting: Cardiology

## 2020-12-01 ENCOUNTER — Ambulatory Visit: Payer: Self-pay | Admitting: Cardiology

## 2020-12-07 ENCOUNTER — Other Ambulatory Visit: Payer: Self-pay

## 2020-12-07 ENCOUNTER — Encounter: Payer: Self-pay | Admitting: Cardiology

## 2020-12-07 ENCOUNTER — Ambulatory Visit (INDEPENDENT_AMBULATORY_CARE_PROVIDER_SITE_OTHER): Payer: BC Managed Care – PPO | Admitting: Cardiology

## 2020-12-07 VITALS — BP 126/82 | HR 69 | Ht 70.0 in | Wt 346.0 lb

## 2020-12-07 DIAGNOSIS — I10 Essential (primary) hypertension: Secondary | ICD-10-CM

## 2020-12-07 DIAGNOSIS — R600 Localized edema: Secondary | ICD-10-CM

## 2020-12-07 DIAGNOSIS — R0602 Shortness of breath: Secondary | ICD-10-CM

## 2020-12-07 DIAGNOSIS — E039 Hypothyroidism, unspecified: Secondary | ICD-10-CM

## 2020-12-07 DIAGNOSIS — I4892 Unspecified atrial flutter: Secondary | ICD-10-CM

## 2020-12-07 NOTE — Progress Notes (Signed)
Date:  12/07/2020   ID:  [name], DOB 05-24-1966, MRN [id number]  PCP:  Felix Pacini, FNP  Cardiologist:  Armanda Magic, MD  Electrophysiologist:  None   Chief Complaint:  PAF  History of Present Illness:    Debra Ware is a 55 y.o. female who  has a hx of metastatic cervical cancer with lesions in both lungs undergoing chemotherapy, morbid obesity, HTN and DM.    Admitted 08/16/2019 with an episode of CP relieved with belching but then with palpitations and saw her PCP and dx with afib with RVR and sent to ER. Given IV Lopressor by EMS and converted to NSR. Her CHADS2VASC score is 1 (female) so no indication for long term anticoagulation at that time. 2D echo showed normal LVF with EF >65% with no wall motion abnormalities. Her afib was felt to possibly be related to paclitaxel treatment. It was recommended that unless she had frequent episodes of PAF would not consider stopping Paclitaxel. Started on Toprol XL. Outpatient coronary CT showed no CAD.  Sh was seen back by me 03/2020 and was having a lot of problems with DOE as well as chest pressure.  She is being followed at [facility] and was placed on steroids and unfortunately had gained over 80lbs.  Her SOB had gotten progressively worse and  had worsening LE pitting edema.  Her Lasix was increased to 40mg  BID.  2D echo showed normal LVF with EF 60-65% and G2DD, mild LAE, LE venous dopplers with no DVT and VQ scan with no DVT.    She was seen back by , NP 04/27/2020 and her breathing and LE edema had improved.  She is on lifelong chemo and is going to [place] every 3 weeks for chemo through [facility].  She has lung mets from her cervical CA and extensive adenopathy in the abdominal and pelvic cavities.    She was admitted to the hospital with afib with RVR and acute diastolic CHF and LE edema.  She was diuresed and started on Eliquis and converted on her own to NSR. SHe is here  today for followup and is doing well.  She has chronic DOE and LE edema that seems stable.  She has not been taking her diuretic daily due to traveling and holidays.  She also has gained weight due to being sedentary from knee pain and eating more over the holidays. She denies any chest pain or pressure, PND, orthopnea,dizziness, palpitations or syncope. She is compliant with her meds and is tolerating meds with no SE.    Prior CV studies:   The following studies were reviewed today:  2D echo  Past Medical History:  Diagnosis Date  . Asthma   . Atrial flutter ([facility])   . Cancer of endocervical canal ([facility]) 06/16/2019  . Chronic diastolic CHF (congestive heart failure) ([facility])   . Hypertension   . Hypothyroidism (acquired)   . Metastatic cancer ([facility])   . Mild dilation of ascending aorta ([facility])   . Morbid obesity with BMI of 40.0-44.9, adult ([facility]) 06/16/2019  . Ovarian cancer ([facility])   . PAF (paroxysmal atrial fibrillation) ([facility]) 08/16/2019  . 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  . 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  . 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   History reviewed. No pertinent surgical history.   No outpatient medications have been marked as taking for the 12/07/20 encounter (  Office Visit) with Sueanne Margarita, MD.     Allergies:   Azithromycin, Metformin and related, Penicillins, and Sulfa antibiotics   Social History   Tobacco Use  . Smoking status: Never Smoker  . Smokeless tobacco: Never Used  Vaping Use  . Vaping Use: Never used  Substance Use Topics  . Alcohol use: Yes    Comment: occasionally  . Drug use: Never     Family Hx: The patient's family history includes Diabetes in her paternal grandfather; Heart attack in her father; Heart disease in her father and paternal grandfather; Hypertension in her paternal grandfather; Lung cancer in her father; Throat cancer in her father.  There is no history of Breast cancer, Ovarian cancer, or Colon cancer.  ROS:   Please see the history of present illness.     All other systems reviewed and are negative.   Labs/Other Tests and Data Reviewed:    Recent Labs: 04/27/2020: NT-Pro BNP 321 06/24/2020: ALT 32 08/20/2020: BUN 11; Creatinine, Ser 1.10; Hemoglobin 13.3; Magnesium 1.4; Platelets 179; Potassium 3.3; Sodium 142   Recent Lipid Panel Lab Results  Component Value Date/Time   CHOL 137 08/16/2019 12:47 PM   TRIG 72 08/16/2019 12:47 PM   HDL 38 (L) 08/16/2019 12:47 PM   CHOLHDL 3.6 08/16/2019 12:47 PM   LDLCALC 85 08/16/2019 12:47 PM    Wt Readings from Last 3 Encounters:  12/07/20 (!) 346 lb (156.9 kg)  11/16/20 (!) 342 lb (155.1 kg)  08/20/20 (!) 332 lb 12.8 oz (151 kg)     Objective:    Vital Signs:  BP 126/82   Pulse 69   Ht 5\' 10"  (1.778 m)   Wt (!) 346 lb (156.9 kg)   SpO2 98%   BMI 49.65 kg/m    GEN: Well nourished, well developed in no acute distress HEENT: Normal NECK: No JVD; No carotid bruits LYMPHATICS: No lymphadenopathy CARDIAC:RRR, no murmurs, rubs, gallops RESPIRATORY:  Clear to auscultation without rales, wheezing or rhonchi  ABDOMEN: Soft, non-tender, non-distended MUSCULOSKELETAL:  1+ LE bilateral edema; No deformity  SKIN: Warm and dry NEUROLOGIC:  Alert and oriented x 3 PSYCHIATRIC:  Normal affect    ASSESSMENT & PLAN:    1.  PAF -she is maintaining NSR on exam today -CHADS2VASC score is 2 (female and HTN) and denies any bleeding problems on the DOAC -continue Eliquis 5mg  BID and Lopressor 25mg  BID -SCr stable at 1.10, Hbg 13.3 in Sept 2021 -K+ was 3.3 in Sept 2021 >> K+ 3.9 on Oct 14/2021 at [facility]  2. HTN -Bp well controlled on exam today -continue Lopressor 25mg  BID  3.  Chronic diastolic CHF -felt to be multifactorial from obesity, sedentary lifestyle, lung mets from her cervical CA and extensive adenopathy in the abdomen and diastolic CHF   -her last echo 04/2020 showed normal LVF with G2DD and mild LAE -she has a hx of pericardial effusion that resolved on echo in May -she does not appear volume overloaded on exam but difficult exam due to morbid obesity -weight up 4lbs over the holidays but she has not taken her diuretic in a few days due to holiday travel and says that she has been more sedentary due to knee pain and eating more over the holidays -encouraged her to take her diuretic daily  4.  Marked LE edema -likely related to morbid obesity, chronic diastolic CHF and pelvic adenopathy -2D echo in May with normal LVF and no DVT by LE venous dopplers -  controlled on demadex 20mg  daily  Followup with me in 6 months   Medication Adjustments/Labs and Tests Ordered: Current medicines are reviewed at length with the patient today.  Concerns regarding medicines are outlined above.  Tests Ordered: No orders of the defined types were placed in this encounter.  Medication Changes: No orders of the defined types were placed in this encounter.   Disposition:  Follow up with PA in 2 weeks and TT in 2 months  Signed, Fransico Him, MD  12/07/2020 3:26 PM    [place]

## 2020-12-07 NOTE — Patient Instructions (Signed)

## 2020-12-28 ENCOUNTER — Encounter: Payer: Self-pay | Admitting: Orthopaedic Surgery

## 2020-12-28 ENCOUNTER — Telehealth: Payer: Self-pay

## 2020-12-28 ENCOUNTER — Ambulatory Visit (INDEPENDENT_AMBULATORY_CARE_PROVIDER_SITE_OTHER): Payer: BC Managed Care – PPO | Admitting: Orthopaedic Surgery

## 2020-12-28 DIAGNOSIS — G8929 Other chronic pain: Secondary | ICD-10-CM

## 2020-12-28 DIAGNOSIS — M25561 Pain in right knee: Secondary | ICD-10-CM

## 2020-12-28 DIAGNOSIS — M2241 Chondromalacia patellae, right knee: Secondary | ICD-10-CM | POA: Diagnosis not present

## 2020-12-28 NOTE — Progress Notes (Signed)
HPI: Mr. Quintin comes in today follow-up 6 weeks status post right knee injection with cortisone.  She states the injection helped but she still has pain in the knee.  States her pain level before shot was 9 out of 10 pain is now 3 out of 10 pain.  Pain is worse with weightbearing activity.  She has pain going up and down stairs.  Most her pain is around the patellar region.  Again MRI of her knee did show severe patellofemoral arthritic changes.  There were no arthritic changes involving medial and lateral compartments.  Small lateral meniscal tear was noted.  She is having no mechanical symptoms of the knee.  She states that the cortisone did not really raise her glucose levels that she could tell.  Review of systems: Please see HPI otherwise negative  Physical exam: Right knee she has full extension flexion beyond 90 degrees.  She has tenderness peripatellar region right knee.  No instability valgus varus stressing right knee.  No abnormal warmth erythema right knee.  Impression: Right knee chondromalacia patella  Plan: Recommend she continue to work on quad strengthening.  She is given a handout on supplemental injection we will try to gain approval for supplemental injection right knee and have her back once available.  Questions were encouraged and answered at length.

## 2020-12-28 NOTE — Telephone Encounter (Signed)
Gel injection auth for right knee

## 2020-12-28 NOTE — Telephone Encounter (Signed)
Submitted for VOB for Durolane-Right knee 

## 2021-01-03 ENCOUNTER — Telehealth: Payer: Self-pay

## 2021-01-03 NOTE — Telephone Encounter (Signed)
Pt insurance requires PA-The paperwork was completed and faxed. Waiting on auth status

## 2021-01-18 ENCOUNTER — Telehealth: Payer: Self-pay

## 2021-01-18 NOTE — Telephone Encounter (Signed)
Per pt's insurance the gel injection is not covered because they do not consider this medication to be medically necessary.   I will call pt to discuss the Trivisc option

## 2021-01-18 NOTE — Telephone Encounter (Signed)
I called the pt and informed her about the trivisc option and told her the cost will be $250 per knee. She stated understanding and would like to think about it and call us back.

## 2021-03-22 ENCOUNTER — Ambulatory Visit (INDEPENDENT_AMBULATORY_CARE_PROVIDER_SITE_OTHER): Payer: BC Managed Care – PPO

## 2021-03-22 ENCOUNTER — Encounter (HOSPITAL_BASED_OUTPATIENT_CLINIC_OR_DEPARTMENT_OTHER): Payer: Self-pay

## 2021-03-22 ENCOUNTER — Emergency Department (HOSPITAL_BASED_OUTPATIENT_CLINIC_OR_DEPARTMENT_OTHER)
Admission: EM | Admit: 2021-03-22 | Discharge: 2021-03-23 | Disposition: A | Discharge status: Home / Self Care | Payer: BC Managed Care – PPO | Attending: Emergency Medicine | Admitting: Emergency Medicine

## 2021-03-22 ENCOUNTER — Encounter (HOSPITAL_COMMUNITY): Payer: Self-pay

## 2021-03-22 ENCOUNTER — Other Ambulatory Visit: Payer: Self-pay

## 2021-03-22 ENCOUNTER — Ambulatory Visit (HOSPITAL_COMMUNITY)
Admission: EM | Admit: 2021-03-22 | Discharge: 2021-03-22 | Disposition: A | Discharge status: Home / Self Care | Payer: BC Managed Care – PPO | Attending: Physician Assistant | Admitting: Physician Assistant

## 2021-03-22 DIAGNOSIS — Z20822 Contact with and (suspected) exposure to covid-19: Secondary | ICD-10-CM | POA: Diagnosis not present

## 2021-03-22 DIAGNOSIS — Z8579 Personal history of other malignant neoplasms of lymphoid, hematopoietic and related tissues: Secondary | ICD-10-CM | POA: Diagnosis not present

## 2021-03-22 DIAGNOSIS — E039 Hypothyroidism, unspecified: Secondary | ICD-10-CM | POA: Insufficient documentation

## 2021-03-22 DIAGNOSIS — I110 Hypertensive heart disease with heart failure: Secondary | ICD-10-CM | POA: Insufficient documentation

## 2021-03-22 DIAGNOSIS — Z8543 Personal history of malignant neoplasm of ovary: Secondary | ICD-10-CM | POA: Diagnosis not present

## 2021-03-22 DIAGNOSIS — R5381 Other malaise: Secondary | ICD-10-CM | POA: Diagnosis not present

## 2021-03-22 DIAGNOSIS — R0602 Shortness of breath: Secondary | ICD-10-CM | POA: Diagnosis not present

## 2021-03-22 DIAGNOSIS — R609 Edema, unspecified: Secondary | ICD-10-CM | POA: Diagnosis not present

## 2021-03-22 DIAGNOSIS — C530 Malignant neoplasm of endocervix: Secondary | ICD-10-CM

## 2021-03-22 DIAGNOSIS — R5383 Other fatigue: Secondary | ICD-10-CM

## 2021-03-22 DIAGNOSIS — J1089 Influenza due to other identified influenza virus with other manifestations: Secondary | ICD-10-CM | POA: Insufficient documentation

## 2021-03-22 DIAGNOSIS — I5033 Acute on chronic diastolic (congestive) heart failure: Secondary | ICD-10-CM | POA: Diagnosis not present

## 2021-03-22 DIAGNOSIS — R509 Fever, unspecified: Secondary | ICD-10-CM

## 2021-03-22 DIAGNOSIS — I4891 Unspecified atrial fibrillation: Secondary | ICD-10-CM | POA: Diagnosis not present

## 2021-03-22 DIAGNOSIS — Z8542 Personal history of malignant neoplasm of other parts of uterus: Secondary | ICD-10-CM | POA: Diagnosis not present

## 2021-03-22 DIAGNOSIS — Z79899 Other long term (current) drug therapy: Secondary | ICD-10-CM | POA: Diagnosis not present

## 2021-03-22 DIAGNOSIS — J45909 Unspecified asthma, uncomplicated: Secondary | ICD-10-CM | POA: Insufficient documentation

## 2021-03-22 DIAGNOSIS — R110 Nausea: Secondary | ICD-10-CM

## 2021-03-22 DIAGNOSIS — Z85118 Personal history of other malignant neoplasm of bronchus and lung: Secondary | ICD-10-CM | POA: Diagnosis not present

## 2021-03-22 DIAGNOSIS — J101 Influenza due to other identified influenza virus with other respiratory manifestations: Secondary | ICD-10-CM

## 2021-03-22 LAB — COMPREHENSIVE METABOLIC PANEL
ALT: 20 U/L (ref 0–44)
AST: 23 U/L (ref 15–41)
Albumin: 4.2 g/dL (ref 3.5–5.0)
Alkaline Phosphatase: 41 U/L (ref 38–126)
Anion gap: 9 (ref 5–15)
BUN: 13 mg/dL (ref 6–20)
CO2: 27 mmol/L (ref 22–32)
Calcium: 9.6 mg/dL (ref 8.9–10.3)
Chloride: 102 mmol/L (ref 98–111)
Creatinine, Ser: 1.12 mg/dL — ABNORMAL HIGH (ref 0.44–1.00)
GFR, Estimated: 59 mL/min — ABNORMAL LOW (ref >60)
Glucose, Bld: 107 mg/dL — ABNORMAL HIGH (ref 70–99)
Potassium: 3.8 mmol/L (ref 3.5–5.1)
Sodium: 138 mmol/L (ref 135–145)
Total Bilirubin: 0.7 mg/dL (ref 0.3–1.2)
Total Protein: 7.1 g/dL (ref 6.5–8.1)

## 2021-03-22 LAB — CBC WITH DIFFERENTIAL/PLATELET
Abs Immature Granulocytes: 0.04 10*3/uL (ref 0.00–0.07)
Basophils Absolute: 0 10*3/uL (ref 0.0–0.1)
Basophils Relative: 0 %
Eosinophils Absolute: 0.1 10*3/uL (ref 0.0–0.5)
Eosinophils Relative: 1 %
HCT: 40.7 % (ref 36.0–46.0)
Hemoglobin: 13.1 g/dL (ref 12.0–15.0)
Immature Granulocytes: 0 %
Lymphocytes Relative: 7 %
Lymphs Abs: 0.7 10*3/uL (ref 0.7–4.0)
MCH: 27.9 pg (ref 26.0–34.0)
MCHC: 32.2 g/dL (ref 30.0–36.0)
MCV: 86.6 fL (ref 80.0–100.0)
Monocytes Absolute: 1.2 10*3/uL — ABNORMAL HIGH (ref 0.1–1.0)
Monocytes Relative: 12 %
Neutro Abs: 8 10*3/uL — ABNORMAL HIGH (ref 1.7–7.7)
Neutrophils Relative %: 80 %
Platelets: 152 10*3/uL (ref 150–400)
RBC: 4.7 MIL/uL (ref 3.87–5.11)
RDW: 14.6 % (ref 11.5–15.5)
WBC: 10 10*3/uL (ref 4.0–10.5)
nRBC: 0 % (ref 0.0–0.2)

## 2021-03-22 LAB — LACTIC ACID, PLASMA: Lactic Acid, Venous: 1 mmol/L (ref 0.5–1.9)

## 2021-03-22 MED ORDER — ACETAMINOPHEN 500 MG PO TABS
1000.0000 mg | ORAL_TABLET | Freq: Once | ORAL | Status: AC
  Administered 2021-03-22: 1000 mg via ORAL
  Filled 2021-03-22: qty 2

## 2021-03-22 NOTE — Discharge Instructions (Signed)
Go to ER

## 2021-03-22 NOTE — ED Triage Notes (Signed)
Pt reports fever and shortness of breath that started few days ago and got worse  last night - also c/o pain to right chest wall   Pt states she  has hx of cervical cancer that mets to  Her lungs -  On chemo tx  -   Pt is alert and oriented

## 2021-03-22 NOTE — ED Provider Notes (Signed)
[place]    CSN: [id number] Arrival date & time: 03/22/21  1750      History   Chief Complaint Chief Complaint  Patient presents with  . Nausea  . Shortness of Breath    HPI Debra Ware is a 55 y.o. female.   Patient presents today with a several week history of shortness of breath that has worsened in the last 24 hours.  She has a complicated past medical history including metastatic cancer including lung metastases.  She is followed by oncology group in [place] and had a chest x-ray 3 weeks ago that was stable.  She is scheduled for CT at her return visit in a few weeks.  Unfortunately, these results are not available in care everywhere.  She reports associated cough but denies any chest pain, palpitations, fever, nausea, vomiting.  She is not actively receiving chemotherapy as she is using immunotherapy Beryle Flock) and has been on this for approximately 18 weeks.  She does have a history of asthma and has been using Symbicort and albuterol more frequently with improvement but not resolution of symptoms.  She denies history of PE or VTE event but is chronically anticoagulated with Eliquis due to history of paroxysmal atrial fibrillation.  She denies any recent illness or medication changes.     Past Medical History:  Diagnosis Date  . Asthma   . Atrial flutter ([place])   . 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 Chronic diastolic CHF (congestive heart failure) ([place])   . Hypertension   . Hypothyroidism (acquired)   . Metastatic cancer ([place])   . Mild dilation of ascending aorta (H[facility])   . Morbid obesity with BMI of 40.0-44.9, adult ([place]) 06/16/2019  . Ovarian cancer ([place])   . PAF (paroxysmal atrial fibrillation) ([place]) 08/16/2019  . 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 Hypothyroidism (acquired)   . Atrial fibrillation ([place]) 06/22/2020  . Paroxysmal atrial flutter ([place]) 06/22/2020  . Atrial flutter with rapid ventricular response ([place]) 06/22/2020  . HTN (hypertension) 06/22/2020  . Acute diastolic CHF (congestive heart failure) ([place]) 06/22/2020  . Palpitations 08/17/2019  . Atrial fibrillation with RVR ([place])   . Chest pain 08/16/2019  . PAF (paroxysmal atrial fibrillation) ([place]) 08/16/2019  . 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 Morbid obesity with BMI of 40.0-44.9, adult ([place]) 06/16/2019  . 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 Cough 06/16/2019  . Pain in joint involving pelvic region and thigh 06/16/2019    History reviewed. No pertinent surgical history.  OB History   No obstetric history on file.      Home Medications    Prior to Admission medications   Medication Sig Start Date End Date Taking? Authorizing Provider  acetaminophen (TYLENOL) 650 MG CR tablet Take 650 mg by mouth daily as needed for pain.    [provider]  albuterol (VENTOLIN HFA) 108 (90 Base) MCG/ACT inhaler Inhale 1 puff into the lungs every 6 (six) hours as needed for wheezing or shortness of breath.  04/19/20   [provider]  apixaban (ELIQUIS) 5 MG TABS tablet Take 1 tablet (5 mg  total) by mouth 2 (two) times daily. 07/22/20 08/21/20  Sueanne Margarita, MD  budesonide-formoterol (SYMBICORT) 160-4.5 MCG/ACT inhaler Inhale 2 puffs into the lungs 2 (two) times daily.  04/21/20   [provider]  Cholecalciferol (VITAMIN D3) 125 MCG (5000 UT) CAPS Take 10,000 Units by mouth daily.    [provider]  Digestive Enzymes (SIMILASE PO) Take 1 capsule by mouth daily.    [provider]  fluticasone (FLONASE) 50  MCG/ACT nasal spray Place 1 spray into both nostrils daily as needed for allergies or rhinitis.    [provider]  levothyroxine (SYNTHROID) 75 MCG tablet Take 75 mcg by mouth daily. 12/17/20   [provider]  lidocaine-prilocaine (EMLA) cream Apply 1 application topically See admin instructions. 1 hour before chemo treatment. 07/17/19   [provider]  loratadine (CLARITIN) 10 MG tablet Take 10 mg by mouth daily.    [provider]  magnesium oxide (MAG-OX) 400 MG tablet Take 1 tablet (400 mg total) by mouth 2 (two) times daily. 08/24/20   Dunn, Nedra Hai, PA-C  metFORMIN (GLUCOPHAGE) 500 MG tablet Take 500 mg by mouth 2 (two) times daily. 07/20/20   [provider]  metoprolol tartrate (LOPRESSOR) 25 MG tablet Take 1 tablet (25 mg total) by mouth 2 (two) times daily. 10/12/20 11/11/20  Sueanne Margarita, MD  OVER THE COUNTER MEDICATION Take 1 capsule by mouth daily. Ferrasorb from [place]; iron with vitamin B and C     [provider]  potassium chloride SA (KLOR-CON) 20 MEQ tablet Take 1 tablet (20 mEq total) by mouth 2 (two) times daily. 08/24/20   Dunn, Nedra Hai, PA-C  pyridoxine (B-6) 100 MG tablet Take 200 mg by mouth daily.    [provider]  torsemide (DEMADEX) 20 MG tablet Take 1 tablet (20 mg total) by mouth 2 (two) times daily. 07/29/20   Sueanne Margarita, MD    Family History Family History  Problem Relation Age of Onset  . Heart attack Father   . Lung cancer Father   . Throat cancer Father   . Heart disease Father   . Diabetes Paternal Grandfather   . Heart disease Paternal Grandfather   . Hypertension Paternal Grandfather   . Breast cancer Neg Hx   . Ovarian cancer Neg Hx   . Colon cancer Neg Hx     Social History Social History   Tobacco Use  . Smoking status: Never Smoker  . Smokeless tobacco: Never Used  Vaping Use  . Vaping Use: Never used  Substance Use Topics  . Alcohol use: Yes    Comment: occasionally  .  Drug use: Never     Allergies   Azithromycin, Penicillins, and Sulfa antibiotics   Review of Systems Review of Systems  Constitutional: Positive for activity change and fatigue. Negative for appetite change and fever.  HENT: Positive for congestion and sinus pressure. Negative for sneezing and sore throat.   Respiratory: Positive for cough and shortness of breath.   Cardiovascular: Negative for chest pain, palpitations and leg swelling.  Gastrointestinal: Negative for abdominal pain, diarrhea, nausea and vomiting.  Neurological: Negative for dizziness, light-headedness and headaches.     Physical Exam Triage Vital Signs ED Triage Vitals  Enc Vitals Group     BP 03/22/21 1845 (!) 120/49     Pulse Rate 03/22/21 1845 84     Resp 03/22/21 1845 18     Temp 03/22/21 1845 (!) 97.1 F (36.2 C)  Temp Source 03/22/21 1845 Oral     SpO2 03/22/21 1845 95 %     Weight --      Height --      Head Circumference --      Peak Flow --      Pain Score 03/22/21 1844 4     Pain Loc --      Pain Edu? --      Excl. in [place]? --    No data found.  Updated Vital Signs BP 117/75 (BP Location: Right Arm)   Pulse 86   Temp (!) 102.4 F (39.1 C) (Oral)   Resp (!) 23   SpO2 94%   Visual Acuity Right Eye Distance:   Left Eye Distance:   Bilateral Distance:    Right Eye Near:   Left Eye Near:    Bilateral Near:     Physical Exam Vitals reviewed.  Constitutional:      General: She is awake. She is not in acute distress.    Appearance: Normal appearance. She is not ill-appearing.     Comments: Very pleasant female appears stated age in no acute distress sitting comfortably in wheelchair  HENT:     Head: Normocephalic and atraumatic.     Right Ear: Tympanic membrane, ear canal and external ear normal. Tympanic membrane is not erythematous or bulging.     Left Ear: Tympanic membrane, ear canal and external ear normal. Tympanic membrane is not erythematous or bulging.     Nose:      Right Sinus: Maxillary sinus tenderness present. No frontal sinus tenderness.     Left Sinus: Maxillary sinus tenderness present. No frontal sinus tenderness.     Mouth/Throat:     Pharynx: Uvula midline. No oropharyngeal exudate or posterior oropharyngeal erythema.  Cardiovascular:     Rate and Rhythm: Normal rate and regular rhythm.     Heart sounds: Murmur heard.    Pulmonary:     Effort: Pulmonary effort is normal.     Breath sounds: Normal breath sounds. No wheezing, rhonchi or rales.     Comments: Clear to auscultation bilaterally.  Reactive cough with deep breathing. Lymphadenopathy:     Head:     Right side of head: No submental, submandibular or tonsillar adenopathy.     Left side of head: No submental, submandibular or tonsillar adenopathy.     Cervical: No cervical adenopathy.  Psychiatric:        Behavior: Behavior is cooperative.      UC Treatments / Results  Labs (all labs ordered are listed, but only abnormal results are displayed) Labs Reviewed - No data to display  EKG   Radiology DG Chest 2 View  Result Date: 03/22/2021 CLINICAL DATA:  Shortness of breath EXAM: CHEST - 2 VIEW COMPARISON:  06/21/2020 FINDINGS: Heart is normal size. Left Port-A-Cath remains in place, unchanged. The tip is in the SVC. No confluent airspace opacities or effusions. No acute bony abnormality. IMPRESSION: No active cardiopulmonary disease. Electronically Signed   By: Rolm Baptise M.D.   On: 03/22/2021 19:50    Procedures Procedures (including critical care time)  Medications Ordered in UC Medications - No data to display  Initial Impression / Assessment and Plan / UC Course  I have reviewed the triage vital signs and the nursing notes.  Pertinent labs & imaging results that were available during my care of the patient were reviewed by me and considered in my medical decision making (see chart for details).  Repeat vital signs showed patient became febrile with slight  decrease in oxygen saturation.  EKG showed NSR without ischemic changes with nonspecific ST changes compared to 06/22/2020 tracing.  X-ray showed no acute abnormality.  Given acute fever with worsening shortness of breath patient will be evaluated in the emergency room.  Someone drove her to urgent care and will take her to ER immediately following visit.  Vital signs stable at the time of discharge for private transport.  Final Clinical Impressions(s) / UC Diagnoses   Final diagnoses:  Shortness of breath  Fever, unspecified  Nausea without vomiting  Malaise and fatigue  Cancer of endocervical canal [facility])     Discharge Instructions     Go to ER    ED Prescriptions    None     PDMP not reviewed this encounter.   Terrilee Croak, PA-C 03/22/21 2008

## 2021-03-22 NOTE — ED Triage Notes (Addendum)
Pt present SOB with nausea. Symptoms started two days ago. Pt states she is having difficulty Breathing. Pt has a history of asthma

## 2021-03-22 NOTE — ED Provider Notes (Signed)
Blood pressure (!) 107/52, pulse 84, temperature (!) 101.4 F (38.6 C), temperature source Oral, resp. rate 19, height 5\' 11"  (1.803 m), weight (!) 154.2 kg, SpO2 96 %.  Assuming care from Dr. Raliegh Ip. Horton.  In short, Debra Ware is a 56 y.o. female with a chief complaint of Fever and Shortness of Breath .  Refer to the original H&P for additional details.   EKG Interpretation  Date/Time:  Tuesday March 22 2021 21:10:12 EDT Ventricular Rate:  90 PR Interval:  148 QRS Duration: 77 QT Interval:  379 QTC Calculation: 464 R Axis:   69 Text Interpretation: Sinus rhythm Left atrial enlargement Abnormal T, consider ischemia, lateral leads Unchanged from previous Confirmed by Lavenia Atlas [phone]) on 03/22/2021 11:02:32 PM      The current plan of care is to follow COVID/Flu testing along with UA. Patient febrile and currently on 2L [place]. No PNA on CXR from PCP office today. No SIRS vitals. No leukocytosis or lactate. Patient is on chemotherapy. Considering CTA pending COVID/Flu results.   12:20 AM  Patient's flu test does come back positive for flu A.  COVID is negative.  UA does not show evidence of infection.  Patient is not requiring supplemental oxygen here.  She is breathing well and conversational with me at bedside.  Symptoms began in the past 24 hours.  This, along with her ongoing chemotherapy status, we discussed the risk/benefit of antiviral medications and will start Tamiflu.  I have called in Tamiflu to her pharmacy along with other supportive care medications.  She feels comfortable with the plan at discharge and will follow with her primary care doctor in the coming week.    Margette Fast, MD 03/23/21 [phone]

## 2021-03-22 NOTE — ED Provider Notes (Signed)
Debra Ware   CSN: [id number] Arrival date & time: 03/22/21  2031     History Chief Complaint  Patient presents with  . Fever  . Shortness of Breath    Debra Ware is a 55 y.o. female.  HPI   27 46-year-old female with past medical history of obesity, metastatic cancer to the lungs undergoing active chemotherapy through port presents the emergency department with worsening shortness of breath and fever.  Patient states over the past couple weeks she has been having intermittent shortness of breath.  This worsened over the last day so she was evaluated as an outpatient.  She reportedly developed a fever and hypoxia at that visit, was placed on oxygen and was referred here for further evaluation.  Patient endorses intermittent cough productive of white phlegm.  She has had chills at home but no documented fever.  Currently she denies any chest pain but does feel short of breath.  No recent vomiting, abdominal pain, diarrhea, GU symptoms.  Past Medical History:  Diagnosis Date  . Asthma   . Atrial flutter ([place])   . 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 Chronic diastolic CHF (congestive heart failure) ([place])   . Hypertension   . Hypothyroidism (acquired)   . Metastatic cancer ([place])   . Mild dilation of ascending aorta (H[facility])   . Morbid obesity with BMI of 40.0-44.9, adult ([place]) 06/16/2019  . Ovarian cancer ([place])   . PAF (paroxysmal atrial fibrillation) ([place]) 08/16/2019  . 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 Hypothyroidism (acquired)   . Atrial fibrillation ([place]) 06/22/2020  . Paroxysmal atrial flutter ([place]) 06/22/2020  . Atrial flutter with rapid ventricular response ([place]) 06/22/2020  . HTN  (hypertension) 06/22/2020  . Acute diastolic CHF (congestive heart failure) ([place]) 06/22/2020  . Palpitations 08/17/2019  . Atrial fibrillation with RVR ([place])   . Chest pain 08/16/2019  . PAF (paroxysmal atrial fibrillation) ([place]) 08/16/2019  . 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 Morbid obesity with BMI of 40.0-44.9, adult ([place]) 06/16/2019  . 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 Cough 06/16/2019  . Pain in joint involving pelvic region and thigh 06/16/2019    History reviewed. No pertinent surgical history.   OB History   No obstetric history on file.     Family History  Problem Relation Age of Onset  . Heart attack Father   . Lung cancer Father   . Throat cancer Father   . Heart disease Father   . Diabetes Paternal Grandfather   . Heart disease Paternal Grandfather   . Hypertension Paternal Grandfather   . Breast cancer Neg Hx   . Ovarian cancer Neg Hx   . Colon cancer Neg Hx     Social History   Tobacco Use  . Smoking status: Never Smoker  . Smokeless tobacco: Never Used  Vaping Use  . Vaping Use: Never used  Substance Use Topics  . Alcohol use: Yes    Comment: occasionally  . Drug use: Never    Home Medications Prior to Admission medications   Medication Sig Start Date End Date Taking?  Authorizing Provider  acetaminophen (TYLENOL) 650 MG CR tablet Take 650 mg by mouth daily as needed for pain.    [provider]  albuterol (VENTOLIN HFA) 108 (90 Base) MCG/ACT inhaler Inhale 1 puff into the lungs every 6 (six) hours as needed for wheezing or shortness of breath.  04/19/20   [provider]  apixaban (ELIQUIS) 5 MG TABS tablet Take 1 tablet (5 mg total) by mouth 2 (two) times daily. 07/22/20 08/21/20  Sueanne Margarita, MD  budesonide-formoterol (SYMBICORT)  160-4.5 MCG/ACT inhaler Inhale 2 puffs into the lungs 2 (two) times daily.  04/21/20   [provider]  Cholecalciferol (VITAMIN D3) 125 MCG (5000 UT) CAPS Take 10,000 Units by mouth daily.    [provider]  Digestive Enzymes (SIMILASE PO) Take 1 capsule by mouth daily.    [provider]  fluticasone (FLONASE) 50 MCG/ACT nasal spray Place 1 spray into both nostrils daily as needed for allergies or rhinitis.    [provider]  levothyroxine (SYNTHROID) 75 MCG tablet Take 75 mcg by mouth daily. 12/17/20   [provider]  lidocaine-prilocaine (EMLA) cream Apply 1 application topically See admin instructions. 1 hour before chemo treatment. 07/17/19   [provider]  loratadine (CLARITIN) 10 MG tablet Take 10 mg by mouth daily.    [provider]  magnesium oxide (MAG-OX) 400 MG tablet Take 1 tablet (400 mg total) by mouth 2 (two) times daily. 08/24/20   Dunn, Nedra Hai, PA-C  metFORMIN (GLUCOPHAGE) 500 MG tablet Take 500 mg by mouth 2 (two) times daily. 07/20/20   [provider]  metoprolol tartrate (LOPRESSOR) 25 MG tablet Take 1 tablet (25 mg total) by mouth 2 (two) times daily. 10/12/20 11/11/20  Sueanne Margarita, MD  OVER THE COUNTER MEDICATION Take 1 capsule by mouth daily. Ferrasorb from [place]; iron with vitamin B and C     [provider]  potassium chloride SA (KLOR-CON) 20 MEQ tablet Take 1 tablet (20 mEq total) by mouth 2 (two) times daily. 08/24/20   Dunn, Nedra Hai, PA-C  pyridoxine (B-6) 100 MG tablet Take 200 mg by mouth daily.    [provider]  torsemide (DEMADEX) 20 MG tablet Take 1 tablet (20 mg total) by mouth 2 (two) times daily. 07/29/20   Sueanne Margarita, MD    Allergies    Azithromycin, Penicillins, and Sulfa antibiotics  Review of Systems   Review of Systems  Constitutional: Positive for chills, fatigue and fever.  HENT: Negative for congestion.   Eyes: Negative for visual disturbance.   Respiratory: Positive for cough, chest tightness and shortness of breath.   Cardiovascular: Negative for chest pain.  Gastrointestinal: Negative for abdominal pain, diarrhea and vomiting.  Genitourinary: Negative for dysuria.  Skin: Negative for rash.  Neurological: Negative for headaches.    Physical Exam Updated Vital Signs BP 120/62   Pulse 91   Temp (!) 101.4 F (38.6 C) (Oral)   Resp 19   Ht 5\' 11"  (1.803 m)   Wt (!) 154.2 kg   SpO2 95%   BMI 47.42 kg/m   Physical Exam Vitals and nursing Ware reviewed.  Constitutional:      Appearance: Normal appearance.  HENT:     Head: Normocephalic.     Mouth/Throat:     Mouth: Mucous membranes are moist.  Cardiovascular:     Rate and Rhythm: Normal rate.  Pulmonary:     Effort: Pulmonary effort is normal. No respiratory distress.  Breath sounds: Decreased breath sounds and rales present.  Abdominal:     Palpations: Abdomen is soft.     Tenderness: There is no abdominal tenderness.  Musculoskeletal:     Right lower leg: Edema present.     Left lower leg: Edema present.  Skin:    General: Skin is warm.  Neurological:     Mental Status: She is alert and oriented to person, place, and time. Mental status is at baseline.  Psychiatric:        Mood and Affect: Mood normal.     ED Results / Procedures / Treatments   Labs (all labs ordered are listed, but only abnormal results are displayed) Labs Reviewed  COMPREHENSIVE METABOLIC PANEL - Abnormal; Notable for the following components:      Result Value   Glucose, Bld 107 (*)    Creatinine, Ser 1.12 (*)    GFR, Estimated 59 (*)    All other components within normal limits  CBC WITH DIFFERENTIAL/PLATELET - Abnormal; Notable for the following components:   Neutro Abs 8.0 (*)    Monocytes Absolute 1.2 (*)    All other components within normal limits  RESP PANEL BY RT-PCR (FLU A&B, COVID) [id number]  LACTIC ACID, PLASMA  LACTIC ACID, PLASMA  URINALYSIS, ROUTINE W REFLEX  MICROSCOPIC    EKG None  Radiology DG Chest 2 View  Result Date: 03/22/2021 CLINICAL DATA:  Shortness of breath EXAM: CHEST - 2 VIEW COMPARISON:  06/21/2020 FINDINGS: Heart is normal size. Left Port-A-Cath remains in place, unchanged. The tip is in the SVC. No confluent airspace opacities or effusions. No acute bony abnormality. IMPRESSION: No active cardiopulmonary disease. Electronically Signed   By: Rolm Baptise M.D.   On: 03/22/2021 19:50    Procedures Procedures   Medications Ordered in ED Medications  acetaminophen (TYLENOL) tablet 1,000 mg (has no administration in time range)    ED Course  I have reviewed the triage vital signs and the nursing notes.  Pertinent labs & imaging results that were available during my care of the patient were reviewed by me and considered in my medical decision making (see chart for details).    MDM Rules/Calculators/A&P                          55 year old female presents the emergency department concern for shortness of breath, cough, hypoxia and fever.  She is febrile on arrival, on 2 L nasal cannula with normal oxygen saturation.  This oxygen was placed prior to arrival from her office visit.  Patient continues to endorse shortness of breath.  Chest x-ray that was done earlier today as an outpatient showed no active cardiopulmonary disease.  Blood work is reassuring with no leukocytosis, lactic is normal, chemistry is unremarkable.  We are still pending urinalysis and COVID swab.  May need to consider CT scan of the chest given the hypoxia.  Patient is pending results and reevaluation.  Patient signed out to Dr. Laverta Baltimore.  Final Clinical Impression(s) / ED Diagnoses Final diagnoses:  None    Rx / DC Orders ED Discharge Orders    None       Lorelle Gibbs, DO 03/22/21 2322

## 2021-03-23 LAB — URINALYSIS, ROUTINE W REFLEX MICROSCOPIC
Bilirubin Urine: NEGATIVE
Glucose, UA: NEGATIVE mg/dL
Ketones, ur: NEGATIVE mg/dL
Nitrite: NEGATIVE
Protein, ur: NEGATIVE mg/dL
Specific Gravity, Urine: 1.018 (ref 1.005–1.030)
pH: 5 (ref 5.0–8.0)

## 2021-03-23 LAB — RESP PANEL BY RT-PCR (FLU A&B, COVID) ARPGX2
Influenza A by PCR: POSITIVE — AB
Influenza B by PCR: NEGATIVE
SARS Coronavirus 2 by RT PCR: NEGATIVE

## 2021-03-23 MED ORDER — FLUTICASONE PROPIONATE 50 MCG/ACT NA SUSP: 2.0000 | Freq: Every day | NASAL | 0 refills | Status: DC

## 2021-03-23 MED ORDER — OSELTAMIVIR PHOSPHATE 75 MG PO CAPS: 75.0000 mg | ORAL_CAPSULE | Freq: Two times a day (BID) | ORAL | 0 refills | Status: DC

## 2021-03-23 MED ORDER — BENZONATATE 100 MG PO CAPS: 100.0000 mg | ORAL_CAPSULE | Freq: Three times a day (TID) | ORAL | 0 refills | Status: DC

## 2021-03-23 MED ORDER — ALBUTEROL SULFATE HFA 108 (90 BASE) MCG/ACT IN AERS: 1.0000 | INHALATION_SPRAY | Freq: Four times a day (QID) | RESPIRATORY_TRACT | 0 refills | Status: AC | PRN

## 2021-03-23 NOTE — Discharge Instructions (Signed)
You were seen in the emergency department today with fever and trouble breathing.  Your flu a test has come back positive.  Your COVID test was negative.  Your lab work is looking reassuring.  I am calling in Tamiflu to your pharmacy to start tomorrow over the next 5 days along with other medicines to help with symptoms.  If you develop worsening shortness of breath, chest pain, confusion you should return to the emergency department for reevaluation.  Please call your primary care doctor in the morning to schedule a follow-up appointment in the coming week.

## 2021-03-23 NOTE — ED Notes (Signed)
Pt discharged to home NAD.  

## 2021-05-03 ENCOUNTER — Other Ambulatory Visit: Payer: Self-pay | Admitting: Cardiology

## 2021-05-03 NOTE — Telephone Encounter (Signed)
Pt's age 55, wt 154.2 kg, SCr 1.12, CrCl 141.41, last ov w/ TT 12/07/20

## 2021-06-24 IMAGING — DX DG CHEST 2V
2 series · 2 of 2 positions shown · non-contrast
Comparison: None.

CLINICAL DATA: Patient with tachycardia.

EXAM:
CHEST - 2 VIEW

[chest pa]
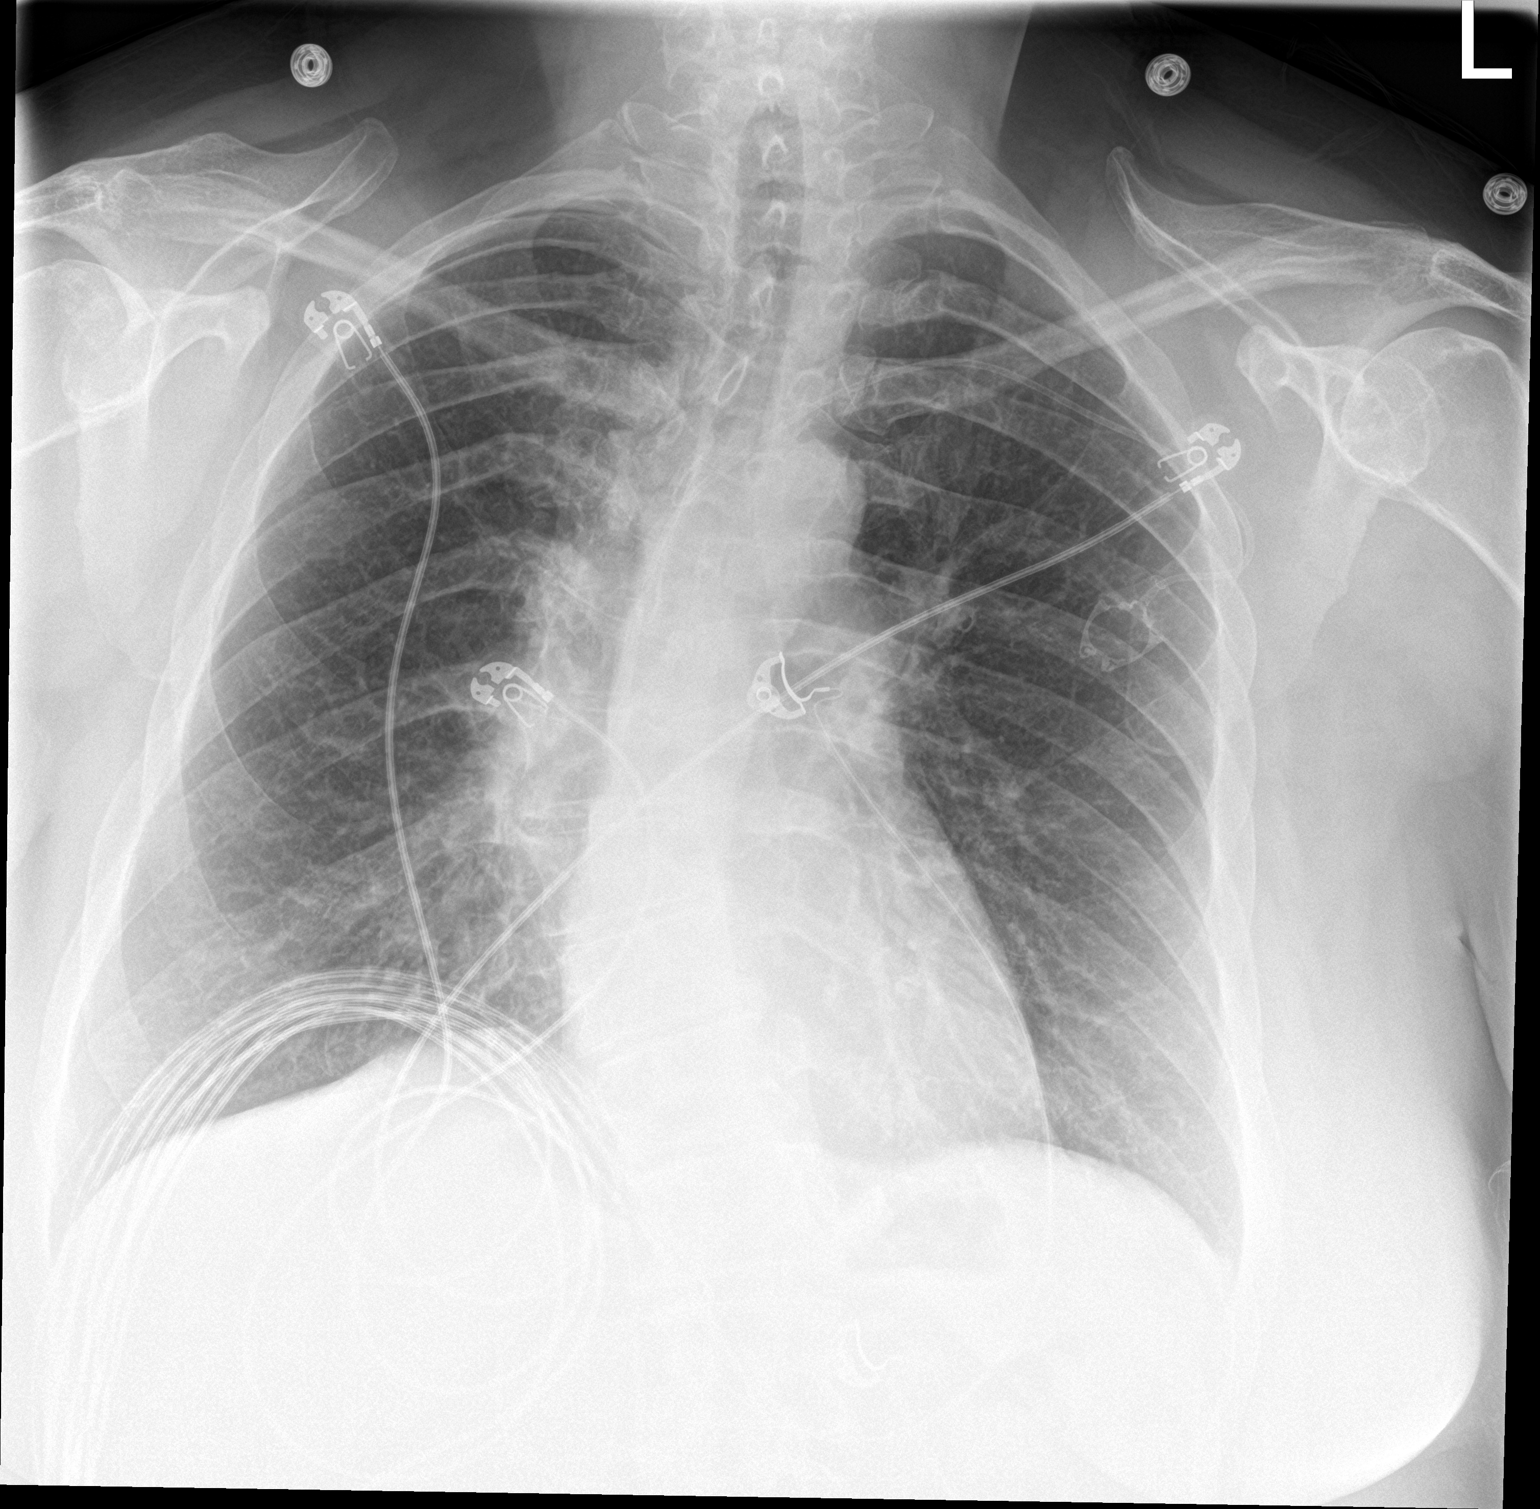

[chest lat]
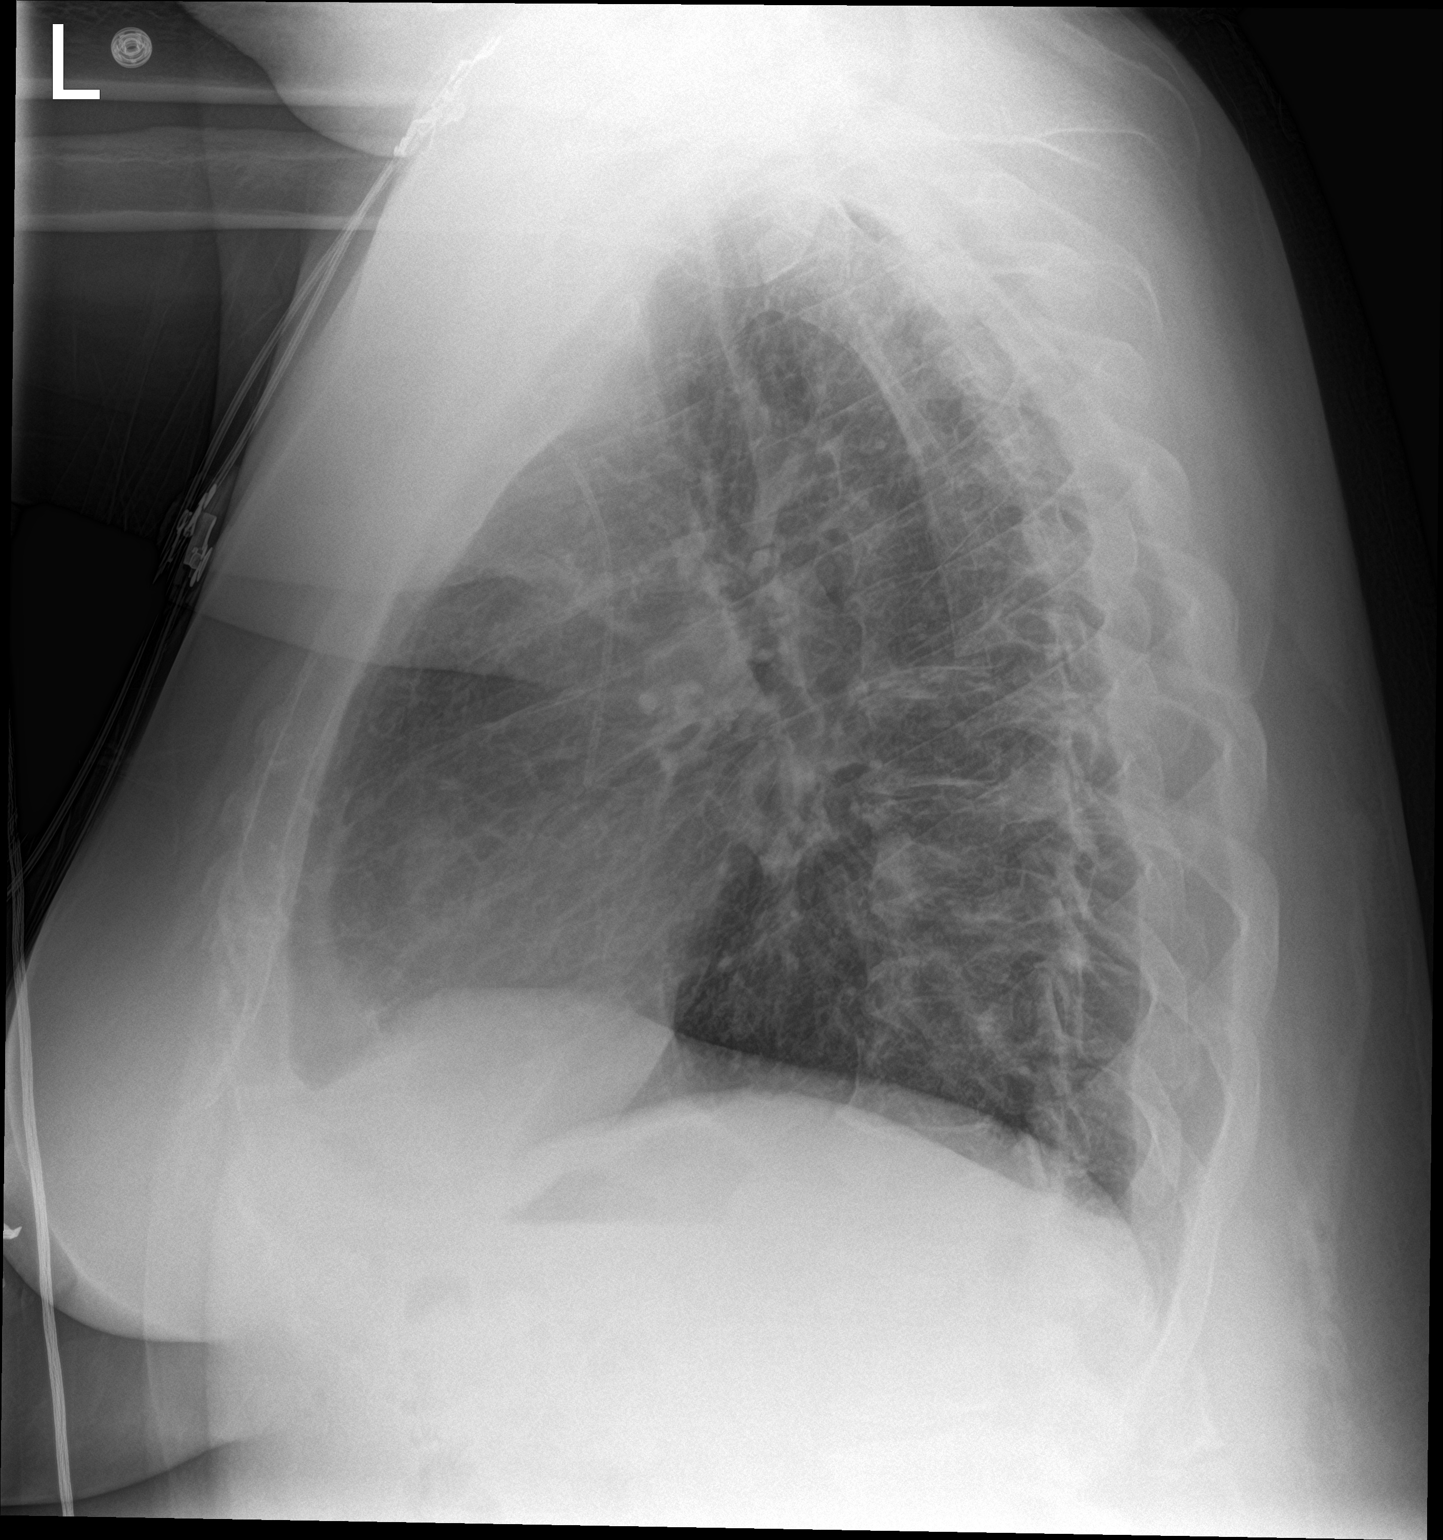

[2 of 2 positions shown; findings below may reference images not displayed]

FINDINGS: Monitoring leads overlie the patient. Normal cardiac and mediastinal
contours. No consolidative pulmonary opacities. No pleural effusion
or pneumothorax. Thoracic spine degenerative changes. Port-A-Cath
tip projects over the superior vena cava.
IMPRESSION: No active cardiopulmonary disease.

## 2021-12-04 HISTORY — PX: HYSTERECTOMY,TOTAL,BILAT SALPINGO-OOPHORECTOMY, ROBOT, LAP: SHX7359

## 2021-12-13 ENCOUNTER — Telehealth (INDEPENDENT_AMBULATORY_CARE_PROVIDER_SITE_OTHER): Payer: Medicare Other | Admitting: Cardiology

## 2021-12-13 ENCOUNTER — Encounter: Payer: Self-pay | Admitting: Cardiology

## 2021-12-13 ENCOUNTER — Other Ambulatory Visit: Payer: Self-pay

## 2021-12-13 VITALS — Ht 70.0 in | Wt 349.8 lb

## 2021-12-13 DIAGNOSIS — I5032 Chronic diastolic (congestive) heart failure: Secondary | ICD-10-CM | POA: Diagnosis not present

## 2021-12-13 DIAGNOSIS — I10 Essential (primary) hypertension: Secondary | ICD-10-CM

## 2021-12-13 DIAGNOSIS — I480 Paroxysmal atrial fibrillation: Secondary | ICD-10-CM | POA: Diagnosis not present

## 2021-12-13 NOTE — Patient Instructions (Signed)
Medication Instructions:  Your physician recommends that you continue on your current medications as directed. Please refer to the Current Medication list given to you today.  *If you need a refill on your cardiac medications before your next appointment, please call your pharmacy*   Lab Work: Your physician recommends that you return for lab work in: Today   If you have labs (blood work) drawn today and your tests are completely normal, you will receive your results only by: MyChart Message (if you have MyChart) OR A paper copy in the mail If you have any lab test that is abnormal or we need to change your treatment, we will call you to review the results.   Testing/Procedures: NONE    Follow-Up: At [facility], you and your health needs are our priority.  As part of our continuing mission to provide you with exceptional heart care, we have created designated Provider Care Teams.  These Care Teams include your primary Cardiologist (physician) and Advanced Practice Providers (APPs -  Physician Assistants and Nurse Practitioners) who all work together to provide you with the care you need, when you need it.  We recommend signing up for the patient portal called "MyChart".  Sign up information is provided on this After Visit Summary.  MyChart is used to connect with patients for Virtual Visits (Telemedicine).  Patients are able to view lab/test results, encounter notes, upcoming appointments, etc.  Non-urgent messages can be sent to your provider as well.   To learn more about what you can do with MyChart, go to [email or web address].    Your next appointment:   1 year(s)  The format for your next appointment:   In Person  Provider:   Fransico Him, MD     Other Instructions Thank you for choosing [facility]!

## 2021-12-13 NOTE — Progress Notes (Signed)
Virtual Visit via Video Note   This visit type was conducted due to national recommendations for restrictions regarding the COVID-19 Pandemic (e.g. social distancing) in an effort to limit this patient's exposure and mitigate transmission in our community.  Due to her co-morbid illnesses, this patient is at least at moderate risk for complications without adequate follow up.  This format is felt to be most appropriate for this patient at this time.  All issues noted in this document were discussed and addressed.  A limited physical exam was performed with this format.  Please refer to the patient's chart for her consent to telehealth for [facility].    Date:  12/13/2021   ID:  Debra Ware, DOB Apr 10, 1966, MRN [id number] The patient was identified using 2 identifiers.  Patient Location: Home Provider Location: Home Office   PCP:  [name], [place] Providers Cardiologist:  Fransico Him, MD     Evaluation Performed:  Follow-Up Visit  Chief Complaint:  CHF, PAF, HTN  History of Present Illness:    Debra Ware is a 56 y.o. female with a hx of metastatic cervical cancer with lesions in both lungs undergoing chemotherapy, morbid obesity, HTN and DM.     Admitted 08/16/2019 with an episode of CP relieved with belching but then with palpitations and saw her PCP and dx with afib with RVR and sent to ER. Given IV Lopressor by EMS and converted to NSR. Her CHADS2VASC score is 1 (female) so no indication for long term anticoagulation at that time but ultimately was placed on Eliquis 5mg  BID for recurrent PAF. 2D echo showed normal LVF with EF > 65% with no wall motion abnormalities. Her afib was felt to possibly be related to paclitaxel treatment. It was recommended that unless she had frequent episodes of PAF would not consider stopping Paclitaxel. Started on Toprol XL. Outpatient coronary CT showed no CAD.   She was seen back by me 03/2020 and was having a lot of  problems with DOE as well as chest pressure.  She is being followed at the [facility] and was placed on steroids and unfortunately had gained over 80lbs.  Her SOB had gotten progressively worse and  had worsening LE pitting edema.  Her Lasix was increased to 40mg  BID for acute on chronic diastolic CHF.  2D echo showed normal LVF with EF 60-65% and G2DD, mild LAE, LE venous dopplers with no DVT and VQ scan with no DVT.     She was seen back by Truitt Merle, NP 04/27/2020 and her breathing and LE edema had improved.  She is on lifelong chemo and is going to [place] every 3 weeks for chemo through [facility].  She has lung mets from her cervical CA and extensive adenopathy in the abdominal and pelvic cavities.    She is here today for follow up.  Since I saw her last she was placed on Ketruda and is battleling a lot of side effects.   She says that a few months ago she had some problems with walking and SOB and was seen by Cardiology in [place] at the [facility] and she wore a heart monitor but dose not have results back yet.  She also had run out of her Symbicort inhaler and had some problems with insurance covering it but now is on Medicaid and is now back on her inhaler.  Her SOB has now resolved.   She denies  any chest pain or pressure, PND, orthopnea, dizziness, palpitations or syncope.  She has chronic LE edema that has improved with compression hose.  Patient is compliant with her meds and is tolerating meds with no SE.      The patient does not have symptoms concerning for COVID-19 infection (fever, chills, cough, or new shortness of breath).    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 No pertinent surgical history.   Current Meds  Medication Sig   acetaminophen (TYLENOL) 650 MG CR tablet Take 650 mg by mouth daily as needed for pain.   albuterol (VENTOLIN HFA) 108 (90 Base) MCG/ACT inhaler Inhale 1 puff into the lungs every 6 (six) hours as needed for wheezing or shortness of breath.    albuterol (VENTOLIN HFA) 108 (90 Base) MCG/ACT inhaler Inhale 1-2 puffs into the lungs every 6 (six) hours as needed for wheezing or shortness of breath.   budesonide-formoterol (SYMBICORT) 160-4.5 MCG/ACT inhaler Inhale 2 puffs into the lungs 2 (two) times daily.    Cholecalciferol (VITAMIN D3) 125 MCG (5000 UT) CAPS Take 10,000 Units by mouth daily.   dicyclomine (BENTYL) 20 MG tablet Take 20 mg by mouth 3 (three) times daily.   ELIQUIS 5 MG TABS tablet TAKE 1 TABLET BY MOUTH TWICE A DAY   lidocaine-prilocaine (EMLA) cream Apply 1 application topically See admin instructions. 1 hour before chemo treatment.   metFORMIN (GLUCOPHAGE) 500 MG tablet Take 500 mg by mouth 2 (two) times daily.   metoprolol tartrate (LOPRESSOR) 25 MG tablet Take 1 tablet (25 mg total) by mouth 2 (two) times daily.   OVER THE COUNTER MEDICATION Take 1 capsule by mouth daily. Ferrasorb from [place]; iron with vitamin B and C    pyridoxine (B-6) 100 MG tablet Take 200 mg by mouth daily.   torsemide (DEMADEX) 20 MG tablet Take 1 tablet (20 mg total) by mouth 2 (two) times daily.     Allergies:   Azithromycin, Penicillins, and Sulfa antibiotics   Social History   Tobacco Use   Smoking status: Never   Smokeless tobacco: Never  Vaping Use   Vaping Use: Never used   Substance Use Topics   Alcohol use: Not Currently    Comment: occasionally   Drug use: Never     Family Hx: The patient's family history includes Diabetes in her paternal grandfather; Heart attack in her father; Heart disease in her father and paternal grandfather; Hypertension in her paternal grandfather; Lung cancer in her father; Throat cancer in her father. There is no history of Breast cancer, Ovarian cancer, or Colon cancer.  ROS:   Please see the history of present illness.     All other systems reviewed and are negative.   Prior CV studies:   The following studies were reviewed today:  none  Labs/Other Tests and Data Reviewed:  EKG:  No ECG reviewed.  Recent Labs: 03/22/2021: ALT 20; BUN 13; Creatinine, Ser 1.12; Hemoglobin 13.1; Platelets 152; Potassium 3.8; Sodium 138   Recent Lipid Panel Lab Results  Component Value Date/Time   CHOL 137 08/16/2019 12:47 PM   TRIG 72 08/16/2019 12:47 PM   HDL 38 (L) 08/16/2019 12:47 PM   CHOLHDL 3.6 08/16/2019 12:47 PM   LDLCALC 85 08/16/2019 12:47 PM    Wt Readings from Last 3 Encounters:  12/13/21 (!) 349 lb 12.8 oz (158.7 kg)  03/22/21 (!) 340 lb (154.2 kg)  12/07/20 (!) 346 lb (156.9 kg)     Risk Assessment/Calculations:    CHA2DS2-VASc Score = 3  This indicates a 3.2% annual risk of stroke. The patient's score is based upon: CHF History: 1 HTN History: 1 Diabetes History: 0 Stroke History: 0 Vascular Disease History: 0 Age Score: 0 Gender Score: 1   Objective:    Vital Signs:  Ht 5\' 10"  (1.778 m)    Wt (!) 349 lb 12.8 oz (158.7 kg)    BMI 50.19 kg/m    VITAL SIGNS:  reviewed GEN:  no acute distress EYES:  sclerae anicteric, EOMI - Extraocular Movements Intact RESPIRATORY:  normal respiratory effort, symmetric expansion CARDIOVASCULAR:  no peripheral edema SKIN:  no rash, lesions or ulcers. MUSCULOSKELETAL:  no obvious deformities. NEURO:  alert and oriented x 3, no obvious focal deficit PSYCH:   normal affect  ASSESSMENT & PLAN:    1.  PAF -She thinks she is maintaining sinus rhythm at home and denies any palpitations except after exerting herself -she wore a heart monitor in [place] but has not gotten results back yet>>she is not sure why that was ordered but had been having SOB at the time -CHADS2VASC score is 2 (female and HTN) and she has not had any bleeding problems on Eliquis -Continue prescription drug management with Eliquis 5 mg twice daily and Lopressor 25 mg twice daily with as needed refills  -Check BMET and CBC  2. HTN -Her BP has been adequately controlled at home -Continue prescription drug management with Lopressor 25 mg twice daily with as needed refills   3.  Chronic diastolic CHF -felt to be multifactorial from obesity, sedentary lifestyle, lung mets from her cervical CA and extensive adenopathy in the abdomen, asthma/COPD and diastolic CHF  -her last echo 04/2020 showed normal LVF with G2DD and mild LAE -she has a hx of pericardial effusion that resolved on echo in May 2021 -Peripheral exam is difficult due to morbid obesity but her weight has been fairly stable -Continue prescription drug management with torsemide 20 mg twice daily with as needed refills -suspect that her SOB was pulmonary related and resolved back on inhalers -she had an echo done she thinks in the past year in [place] and will get a copy of the results        COVID-19 Education: The signs and symptoms of COVID-19 were discussed with the patient and how to seek care for testing (follow up with PCP or arrange E-visit).  The importance of social distancing was discussed today.  Time:   Today, I have spent 20 minutes with the patient with telehealth technology discussing the above problems.     Medication Adjustments/Labs and Tests Ordered: Current medicines are reviewed at length with the patient today.  Concerns regarding medicines are outlined above.   Tests Ordered: No orders of the  defined types were placed in this encounter.   Medication Changes: No orders of  the defined types were placed in this encounter.   Follow Up:  In Person in 1 year(s)  Signed, Fransico Him, MD  12/13/2021 9:56 AM    Miguel Barrera

## 2021-12-13 NOTE — Addendum Note (Signed)
Addended by: Levonne Hubert on: 12/13/2021 10:12 AM   Modules accepted: Orders

## 2022-01-10 ENCOUNTER — Telehealth: Payer: Self-pay

## 2022-01-10 NOTE — Telephone Encounter (Signed)
Spoke with the patient who states that she was started on CPAP for her sleep apnea a while back, however she lost insurance and had to turn in her machine. She since lost a lot of weight and was retested for sleep apnea which showed that she was borderline and could get by with out treatment. She states that she is actually going to be retested in one month. Dr. Dorise Bullion has referred her for the test so she does not need a sleep test ordered by Dr. [name] at this time.

## 2022-01-10 NOTE — Telephone Encounter (Signed)
-----   Message from Sueanne Margarita, MD sent at 01/10/2022 11:00 AM EST ----- Please let patient know that I got her records from the cancer treatment centers of [place] and saw that she had been diagnosed with sleep apnea in the past but that was never followed up on.  Please order an MR home sleep study

## 2022-01-25 ENCOUNTER — Telehealth: Payer: Self-pay

## 2022-01-25 DIAGNOSIS — I4891 Unspecified atrial fibrillation: Secondary | ICD-10-CM

## 2022-01-25 DIAGNOSIS — I480 Paroxysmal atrial fibrillation: Secondary | ICD-10-CM

## 2022-01-25 NOTE — Telephone Encounter (Signed)
-----   Message from Sueanne Margarita, MD sent at 01/12/2022  4:15 PM EST ----- Monitor from her MD in [place] shows SVT with RVR likely atrial flutter , PAT and WCT- please get her in with EP

## 2022-01-25 NOTE — Telephone Encounter (Signed)
Spoke with the patient and advised that Dr. [name] has referred her to see EP. Patient verbalized understanding and is aware that our schedulers will be calling her to set up an appointment.  Referral has been placed.

## 2022-01-27 ENCOUNTER — Ambulatory Visit (INDEPENDENT_AMBULATORY_CARE_PROVIDER_SITE_OTHER): Payer: Medicare Other | Admitting: Physician Assistant

## 2022-01-27 ENCOUNTER — Encounter: Payer: Self-pay | Admitting: Physician Assistant

## 2022-01-27 ENCOUNTER — Other Ambulatory Visit: Payer: Self-pay

## 2022-01-27 VITALS — BP 132/90 | HR 67 | Ht 69.0 in | Wt 353.0 lb

## 2022-01-27 DIAGNOSIS — I5032 Chronic diastolic (congestive) heart failure: Secondary | ICD-10-CM | POA: Diagnosis not present

## 2022-01-27 DIAGNOSIS — I480 Paroxysmal atrial fibrillation: Secondary | ICD-10-CM

## 2022-01-27 DIAGNOSIS — I4729 Other ventricular tachycardia: Secondary | ICD-10-CM

## 2022-01-27 DIAGNOSIS — Z79899 Other long term (current) drug therapy: Secondary | ICD-10-CM

## 2022-01-27 NOTE — Progress Notes (Addendum)
Cardiology Office Note Date:  01/27/2022  Patient ID:  Debra Ware, DOB 1966-11-11, MRN [id number] PCP:  [name], FNP  Cardiologist:  Dr. [name] Electrophysiologist: Dr. Curt Bears    Chief Complaint: abnormal monitor  History of Present Illness: Debra Ware is a 56 y.o. female with history of metastatic cervical cancer with lesions in both lungs undergoing chemotherapy, morbid obesity, HTN, and DM, AFib/flutter, chronic CHF (diastolic)  She comes today to be seen for Dr. Curt Bears, seen last (and only) during her hospital stay July 2021 with AF/Flutter, discussed Flecainide as an option for her  Long hx of CP, SOB, edema, last saw Dr. [name] Jan 2023,  had been placed on Ketruda and is battleling a lot of side effects.   She says that a few months ago she had some problems with walking and SOB and was seen by Cardiology in [place] at the [facility] and she wore a heart monitor but dose not have results back yet.  She also had run out of her Symbicort inhaler and had some problems with insurance covering it but now is on Medicaid and is now back on her inhaler.  Her SOB has now resolved.  SOB felt to be multifactorial from obesity, sedentary lifestyle, lung mets from her cervical CA and extensive adenopathy in the abdomen, asthma/COPD and diastolic CHF  Planned for labs and getting her echo/monitor [facility]  Pending new sleep study it seems Monitor from her MD in [place] shows SVT with RVR likely atrial flutter , PAT and WCT- and referred back to EP  TODAY She comes alone, her sister though on speaker phone She re[orts her breathing is still better, back on her inhaler. She gets winded with longer distances, she says about 100 yards. No SOB with her ADLs She does not use her torsemide, says make her legs cramp, she will take a tablet when her SOB is not resolved by use of an inhaler, this is rare She has edema, says today is an improvement from her  usual She reports her weight stable about 355 for "a long time" She has support hose on today and reports a wound L shin, has recurrent LE wounds that come and go on both lower shins for quite some time that she sees wound care for. She has an infrequent awareness of her heartbeat, when walking up stairs she is aware of her heart beating faster, though settles quickly when done. 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/2019   Chronic diastolic CHF (congestive heart failure) ([place])    Hypertension    Hypothyroidism (acquired)    Metastatic cancer ([place])    Mild dilation of ascending aorta ([place])    Morbid obesity with BMI of 40.0-44.9, adult ([place]) 06/16/2019   Ovarian cancer ([place])    PAF (paroxysmal atrial fibrillation) ([place]) 08/16/2019   Secondary malignant neoplasm of both lungs ([place]) 06/16/2019   Secondary  malignant neoplasm of iliac lymph nodes ([place]) 06/16/2019   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  Current Outpatient Medications  Medication Sig Dispense Refill   acetaminophen (TYLENOL) 650 MG CR tablet Take 650 mg by mouth daily as needed for pain.     albuterol (VENTOLIN HFA) 108 (90 Base) MCG/ACT inhaler Inhale 1 puff into the lungs every 6 (six) hours as needed for wheezing or shortness of breath.      albuterol (VENTOLIN HFA) 108 (90 Base) MCG/ACT inhaler Inhale 1-2 puffs into the lungs every 6 (six)  hours as needed for wheezing or shortness of breath. 6.7 g 0   benzonatate (TESSALON) 100 MG capsule Take 1 capsule (100 mg total) by mouth every 8 (eight) hours. 21 capsule 0   budesonide-formoterol (SYMBICORT) 160-4.5 MCG/ACT inhaler Inhale 2 puffs into the lungs 2 (two) times daily.      Cholecalciferol (VITAMIN D3) 125 MCG (5000 UT) CAPS Take 10,000 Units by mouth daily.     dicyclomine (BENTYL) 20 MG tablet Take 20 mg by mouth 3 (three) times daily.     Digestive Enzymes (SIMILASE PO) Take 1 capsule by mouth daily.     ELIQUIS 5 MG TABS tablet TAKE 1 TABLET BY MOUTH TWICE A DAY 60 tablet 5   fluticasone (FLONASE) 50 MCG/ACT nasal spray Place 2 sprays into both nostrils daily for 14 days. (Patient not taking: Reported on 12/13/2021) 1 g 0   levothyroxine (SYNTHROID) 75 MCG tablet Take 75 mcg by mouth daily.     lidocaine-prilocaine (EMLA) cream Apply 1 application topically See admin instructions. 1 hour before chemo treatment.     loratadine (CLARITIN) 10 MG tablet Take 10 mg by mouth daily. (Patient not taking: Reported on 12/13/2021)     magnesium oxide (MAG-OX) 400 MG tablet Take 1 tablet (400 mg total) by mouth 2 (two) times daily. 180 tablet 3   metFORMIN (GLUCOPHAGE) 500 MG tablet Take 500 mg by mouth 2 (two) times daily.     metoprolol tartrate (LOPRESSOR) 25 MG tablet Take 1 tablet (25 mg total) by mouth 2 (two) times daily. 180 tablet 3   oseltamivir (TAMIFLU) 75 MG capsule Take 1 capsule (75 mg total) by mouth every 12 (twelve) hours. 10 capsule 0   OVER THE COUNTER MEDICATION Take 1 capsule by mouth daily. Ferrasorb from [place]; iron with vitamin B and C      potassium chloride SA (KLOR-CON) 20 MEQ tablet Take 1 tablet (20 mEq total) by mouth 2 (two) times daily. 180 tablet 3   pyridoxine (B-6) 100 MG tablet Take 200 mg by mouth daily.     torsemide (DEMADEX) 20 MG tablet Take 1 tablet (20 mg total) by mouth 2 (two) times daily. 60 tablet 11   No current facility-administered  medications for this visit.    Allergies:   Azithromycin, Penicillins, and Sulfa antibiotics   Social History:  The patient  reports that she has never smoked. She has never used smokeless tobacco. She reports that she does not currently use alcohol. She reports that she does not use drugs.   Family History:  The patient's family history includes Diabetes in her paternal grandfather; Heart attack in her father; Heart disease in her father and paternal grandfather; Hypertension in her paternal grandfather; Lung cancer in her father; Throat cancer in her father.  ROS:  Please see the history of present illness.    All other systems  are reviewed and otherwise negative.   PHYSICAL EXAM:  VS:  There were no vitals taken for this visit. BMI: There is no height or weight on file to calculate BMI. Well nourished, well developed, in no acute distress HEENT: normocephalic, atraumatic Neck: no JVD, carotid bruits or masses Cardiac:  RRR; no significant murmurs, no rubs, or gallops Lungs:  CTA b/l, no wheezing, rhonchi or rales Abd: soft, nontender MS: no deformity or atrophy Ext: she has compression hose on, 1++ edema appreciated Skin: warm and dry, no rash Neuro:  No gross deficits appreciated Psych: euthymic mood, full affect   EKG:  Done today and reviewed by myself shows  SB 59, T changes similar to prior EKGs  04/22/20: TTE IMPRESSIONS   1. Left ventricular ejection fraction, by estimation, is 60 to 65%. The  left ventricle has normal function. The left ventricle has no regional  wall motion abnormalities. Left ventricular diastolic parameters are  consistent with Grade II diastolic  dysfunction (pseudonormalization). Elevated left ventricular end-diastolic  pressure.   2. Right ventricular systolic function is normal. The right ventricular  size is normal. There is normal pulmonary artery systolic pressure.   3. Left atrial size was mildly dilated.   4. The mitral valve is normal  in structure. Trivial mitral valve  regurgitation. No evidence of mitral stenosis.   5. The aortic valve is tricuspid. Aortic valve regurgitation is not  visualized. No aortic stenosis is present.   6. Aortic dilatation noted. 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   7. The inferior vena cava is normal in size with greater than 50%  respiratory variability, suggesting right atrial pressure of 3 mmHg.   09/17/2019: Coronary CT IMPRESSION: 1. No significant coronary disease, CADRADS = 0.   2. Coronary calcium score of 0. This was 0 percentile for age and sex matched control.   3. Normal coronary origin with right dominance.   4.  Dilated PA to 37 mm suggestive of pulmonary hypertension   5.  Catheter seen in the SVC   Recent Labs: 03/22/2021: ALT 20; BUN 13; Creatinine, Ser 1.12; Hemoglobin 13.1; Platelets 152; Potassium 3.8; Sodium 138  No results found for requested labs within last 8760 hours.   CrCl cannot be calculated (Patient's most recent lab result is older than the maximum 21 days allowed.).   Wt Readings from Last 3 Encounters:  12/13/21 (!) 349 lb 12.8 oz (158.7 kg)  03/22/21 (!) 340 lb (154.2 kg)  12/07/20 (!) 346 lb (156.9 kg)     Other studies reviewed: Additional studies/records reviewed today include: summarized above  ASSESSMENT AND PLAN:  PAFib, flutter WCT  AFib longest 33min20sec Longest WCT 16beats, is irregular, shifts axis, ?if aberrancy, though cannot r/o NSVT PACs, some blocked PACs PVCs, NSVT otherwise longest 4 beats PVCs (0.15%) Symptoms: are associated with SR/SB, PACs, one with a PSVT 4beats NSVT episodes are not patient triggered  She has SB here today at baseline  She gets her medical care  in [place] at the [facility], has a cardiologist there [place] up with [place] to have providers known to her locally when home She is scheduled for a pharmacological stress test in [place] March 7, she will be  there through the 9th  She is not particularly bothered by brief occasional palpitations, if there is no absolute need to treat them, she would really rather not have to add yet another medication to her regime  3.  Chronic CHF (diastolic) She is edematous, reports stable weight and breathing at her baseline She does not like to take the torsemide   I will reach out to Drs. Camnitz and Turner, for their input, no changes being recommended today  Will get BMET/mag I am unable to find her echo.  She has an upcoming stress test in [place] and with minimal symptoms, will defer to her attending team there.  ADDEND: Dr. Curt Bears recommended if not bothered by her PACs can avoid AAD. 02/02/22 Rhiley Solem,PA-C   Disposition: F/u with her team in [place], [place] in 17mo, sooner if needed.  Current medicines are reviewed at length with the patient today.  The patient did not have any concerns regarding medicines.  Venetia Night, PA-C 01/27/2022 4:43 AM     [facility] [place] [address] [phone] (office)  [phone] (fax)

## 2022-01-27 NOTE — Patient Instructions (Signed)
Medication Instructions:   Your physician recommends that you continue on your current medications as directed. Please refer to the Current Medication list given to you today.  *If you need a refill on your cardiac medications before your next appointment, please call your pharmacy*   Lab Work:  BMET AND [place]   If you have labs (blood work) drawn today and your tests are completely normal, you will receive your results only by: [place] (if you have MyChart) OR A paper copy in the mail If you have any lab test that is abnormal or we need to change your treatment, we will call you to review the results.   Testing/Procedures:  NONE ORDERED  TODAY    Follow-Up: At [facility], you and your health needs are our priority.  As part of our continuing mission to provide you with exceptional heart care, we have created designated Provider Care Teams.  These Care Teams include your primary Cardiologist (physician) and Advanced Practice Providers (APPs -  Physician Assistants and Nurse Practitioners) who all work together to provide you with the care you need, when you need it.  We recommend signing up for the patient portal called "MyChart".  Sign up information is provided on this After Visit Summary.  MyChart is used to connect with patients for Virtual Visits (Telemedicine).  Patients are able to view lab/test results, encounter notes, upcoming appointments, etc.  Non-urgent messages can be sent to your provider as well.   To learn more about what you can do with MyChart, go to [email or web address].    Your next appointment:   2-3 month(s) ( CONTACT [facility] FOR EP SCHEDULING ISSUES )   The format for your next appointment:   In Person  Provider:   Allegra Lai, MD    Other Instructions

## 2022-01-28 LAB — BASIC METABOLIC PANEL
BUN/Creatinine Ratio: 10 (ref 9–23)
BUN: 11 mg/dL (ref 6–24)
CO2: 26 mmol/L (ref 20–29)
Calcium: 9.8 mg/dL (ref 8.7–10.2)
Chloride: 100 mmol/L (ref 96–106)
Creatinine, Ser: 1.07 mg/dL — ABNORMAL HIGH (ref 0.57–1.00)
Glucose: 90 mg/dL (ref 70–99)
Potassium: 4.1 mmol/L (ref 3.5–5.2)
Sodium: 140 mmol/L (ref 134–144)
eGFR: 62 mL/min/{1.73_m2} (ref >59)

## 2022-01-28 LAB — MAGNESIUM: Magnesium: 1.9 mg/dL (ref 1.6–2.3)

## 2022-02-01 ENCOUNTER — Telehealth: Payer: Self-pay | Admitting: *Deleted

## 2022-02-01 NOTE — Telephone Encounter (Signed)
-----   Message from [facility], [place] sent at 01/30/2022 11:37 AM EST ----- ?Labs, electrolytes are OK ? ?

## 2022-02-01 NOTE — Telephone Encounter (Signed)
Spoke with patient aware of results and verbalized understanding ?

## 2022-02-14 ENCOUNTER — Institutional Professional Consult (permissible substitution): Payer: BC Managed Care – PPO | Admitting: Neurology

## 2022-03-07 ENCOUNTER — Institutional Professional Consult (permissible substitution): Payer: Self-pay | Admitting: Neurology

## 2022-04-06 ENCOUNTER — Telehealth: Payer: Self-pay

## 2022-04-06 NOTE — Telephone Encounter (Signed)
CALLED [facility] AT [phone] TO GET LAST OV NOTE AND STRESS TEST ?

## 2022-04-11 ENCOUNTER — Ambulatory Visit: Payer: Medicare Other | Admitting: Cardiology

## 2022-05-02 IMAGING — CT CT ANGIO CHEST
2 of 6 series · 19 of 36 positions shown · IV contrast (omnipaque)
Comparison: None.

CLINICAL DATA: Chest pain.

EXAM:
CT ANGIOGRAPHY CHEST WITH CONTRAST
TECHNIQUE: Multidetector CT imaging of the chest was performed using the
standard protocol during bolus administration of intravenous
contrast. Multiplanar CT image reconstructions and MIPs were
obtained to evaluate the vascular anatomy.
CONTRAST:  75mL OMNIPAQUE IOHEXOL 350 MG/ML SOLN

[Series 7: pe thins · axial · 0.90mm/px · z∈[+1112,+1371]mm · 18 of 412 slices shown]
[im 21/412  lung]
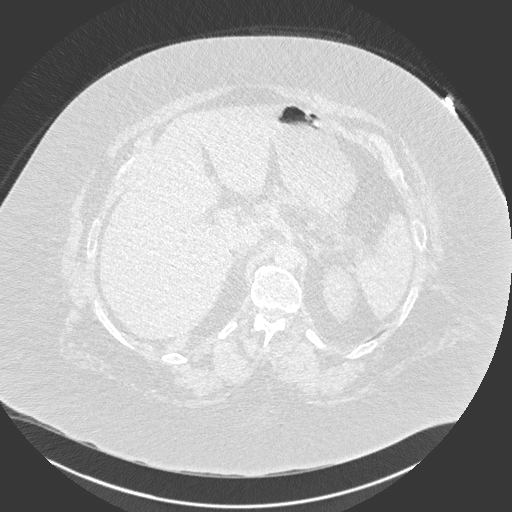
[im 42/412  mediastinal]
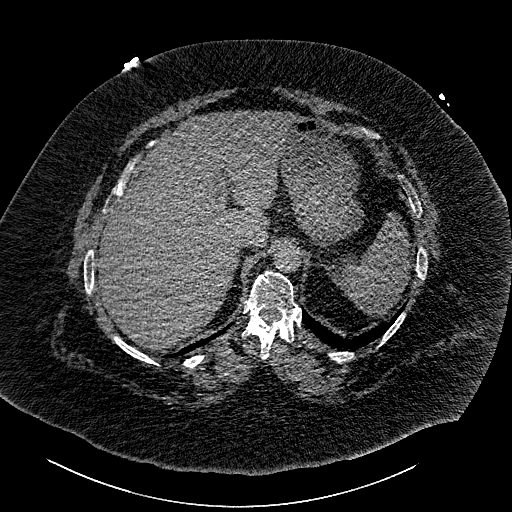
[im 62/412  lung]
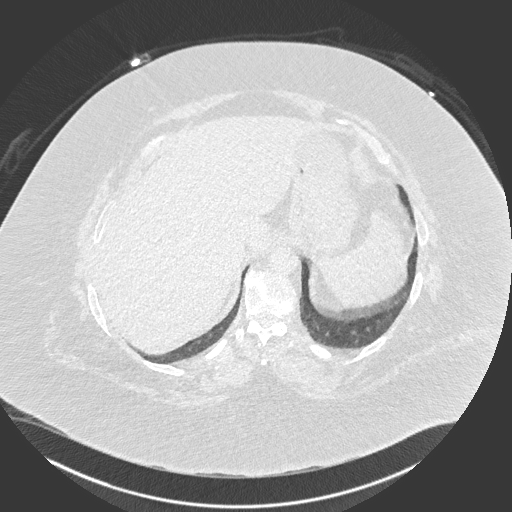
[im 83/412  mediastinal]
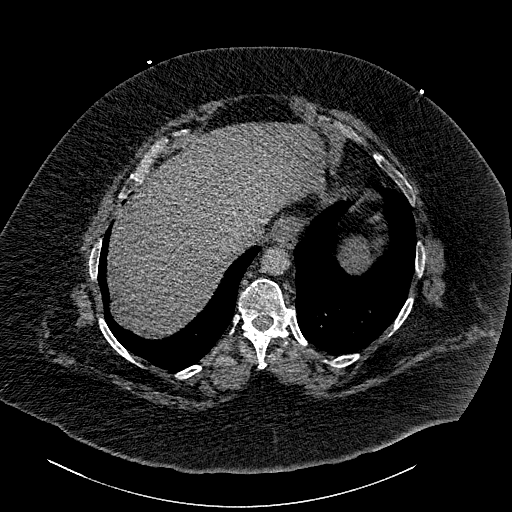
[im 103/412  lung]
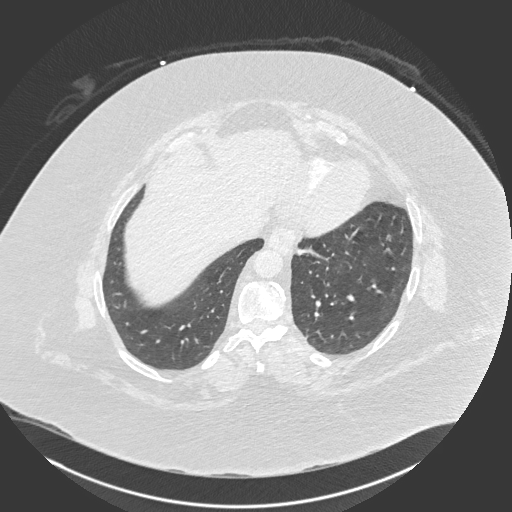
[im 124/412  mediastinal]
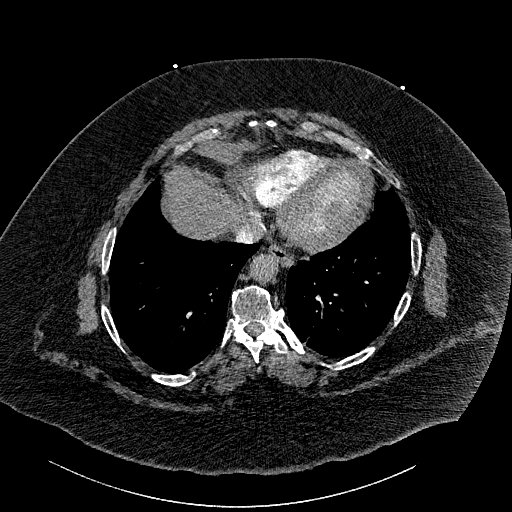
[im 144/412  lung]
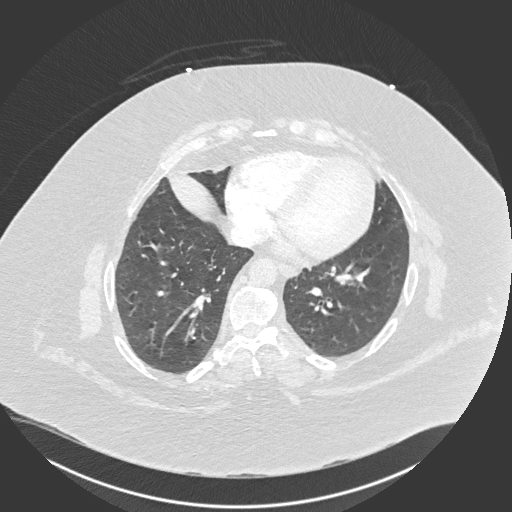
[im 165/412  mediastinal]
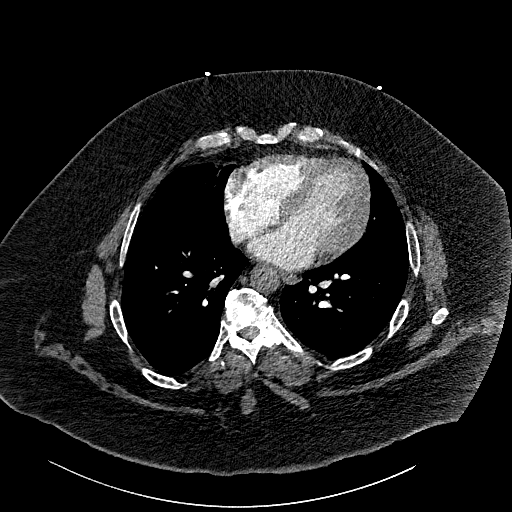
[im 185/412  lung]
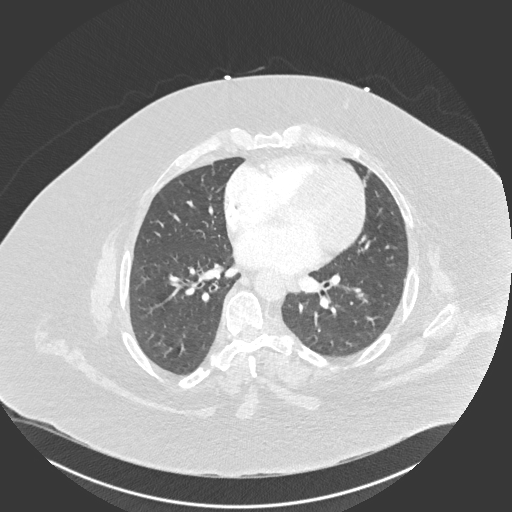
[im 227/412  mediastinal]
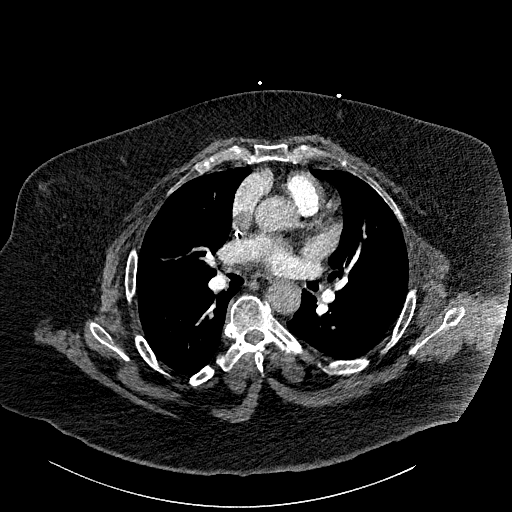
[im 247/412  lung]
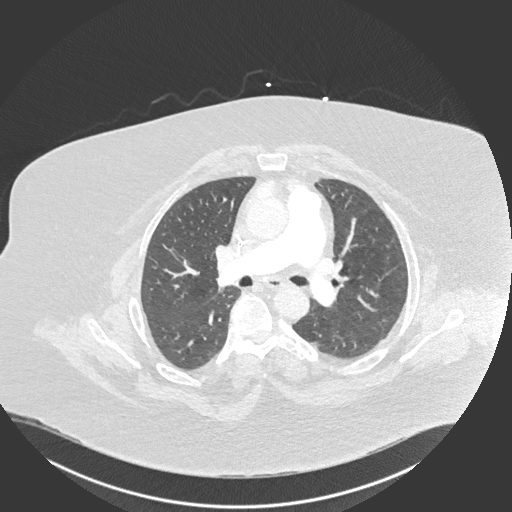
[im 268/412  mediastinal]
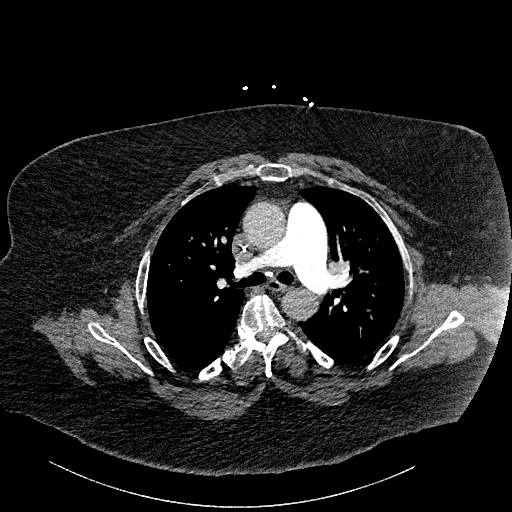
[im 288/412  lung]
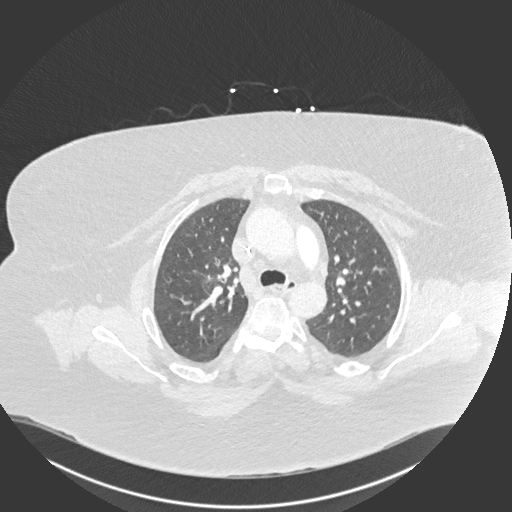
[im 309/412  mediastinal]
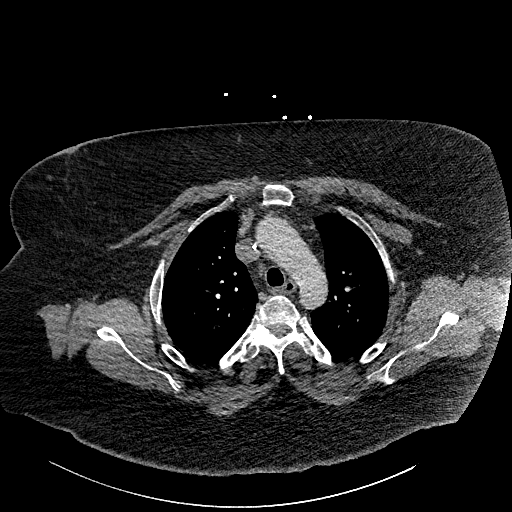
[im 329/412  lung]
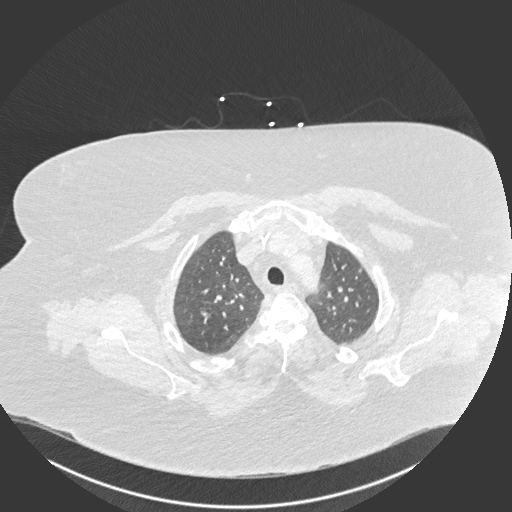
[im 350/412  mediastinal]
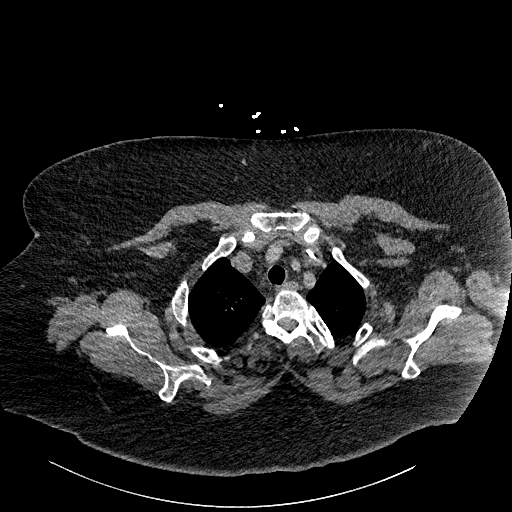
[im 370/412  lung]
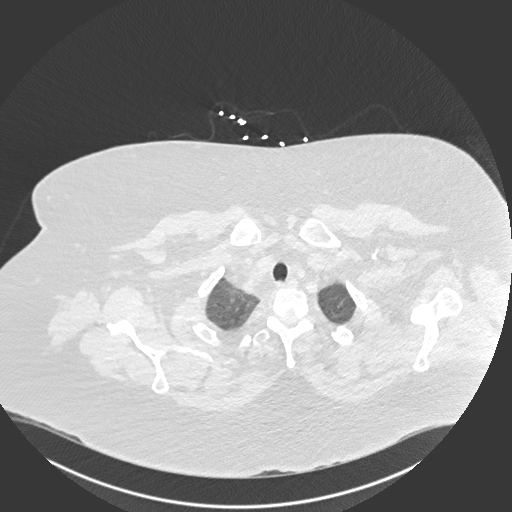
[im 391/412  mediastinal]
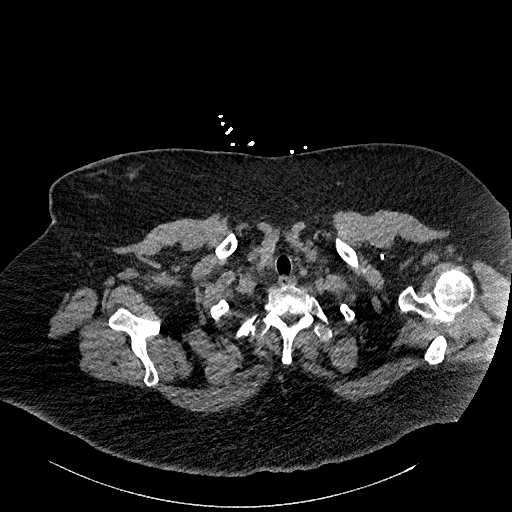

[Series 8: pe 2mm cor · coronal · 0.57mm/px · 1 of 151 slices shown]
[im 76/151  mediastinal]
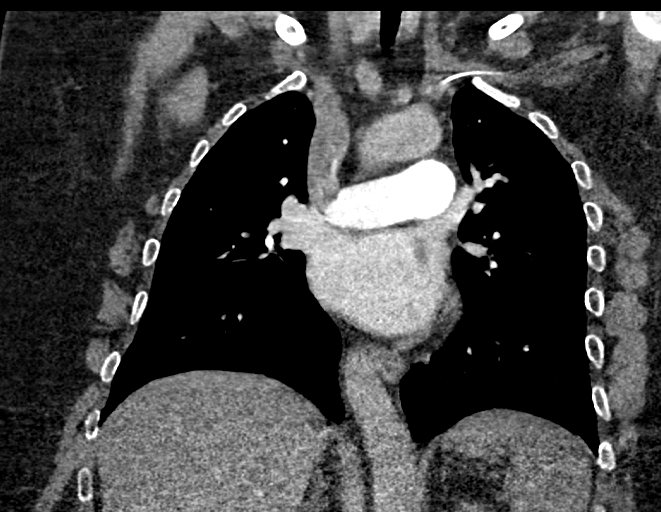

[19 of 36 positions shown; findings below may reference images not displayed]

FINDINGS: Cardiovascular: A left-sided venous catheter is noted. Satisfactory
opacification of the pulmonary arteries to the segmental level. No
evidence of pulmonary embolism. Normal heart size. No pericardial
effusion.

Mediastinum/Nodes: No enlarged mediastinal, hilar, or axillary lymph
nodes. Thyroid gland, trachea, and esophagus demonstrate no
significant findings.

Lungs/Pleura: Lungs are clear. No pleural effusion or pneumothorax.

Upper Abdomen: There is a small hiatal hernia.

Musculoskeletal: Moderate to marked severity dextroscoliosis of the
thoracic spine is seen with multilevel degenerative changes.

Review of the MIP images confirms the above findings.
IMPRESSION: 1. No CT evidence of pulmonary embolism.
2. Small hiatal hernia.
3. Moderate to marked severity dextroscoliosis of the thoracic spine
with multilevel degenerative changes.

## 2022-05-16 ENCOUNTER — Ambulatory Visit (INDEPENDENT_AMBULATORY_CARE_PROVIDER_SITE_OTHER): Payer: Medicare Other | Admitting: Cardiology

## 2022-05-16 ENCOUNTER — Encounter: Payer: Self-pay | Admitting: Cardiology

## 2022-05-16 VITALS — BP 136/84 | HR 92 | Ht 70.0 in | Wt 358.0 lb

## 2022-05-16 DIAGNOSIS — I480 Paroxysmal atrial fibrillation: Secondary | ICD-10-CM | POA: Diagnosis not present

## 2022-05-16 DIAGNOSIS — D6869 Other thrombophilia: Secondary | ICD-10-CM | POA: Diagnosis not present

## 2022-05-16 NOTE — Progress Notes (Signed)
Electrophysiology Office Note   Date:  05/16/2022   ID:  Debra Ware, DOB [birth year], MRN [id number]  PCP:  [name], FNP  Cardiologist:  [name] Primary Electrophysiologist:  Satine Hausner Meredith Leeds, MD    Chief Complaint: Atrial fibrillation   History of Present Illness: Debra Ware is a 56 y.o. female who is being seen today for the evaluation of atrial fibrillation at the request of [name], *. Presenting today for electrophysiology evaluation.  She has a history significant for metastatic cervical cancer with lesions in both lungs requiring chemotherapy, morbid obesity, hypertension, diabetes, atrial fibrillation/flutter, diastolic heart failure.  She was hospitalized July 2021 with atrial fibrillation and flutter.  Today she feels well from a heart standpoint.  She is unaware of arrhythmias.  She feels that her metoprolol is overall improving her symptomatology.  Despite that, she does have fatigue.  She has been having issues with her thyroid and does feel that is impacting her fatigue she [name] take her metoprolol at night.  Otherwise, she has no major complaints.  Today, she denies symptoms of palpitations, chest pain, shortness of breath, orthopnea, PND, lower extremity edema, claudication, dizziness, presyncope, syncope, bleeding, or neurologic sequela. The patient is tolerating medications without difficulties.    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   Current Outpatient Medications  Medication Sig Dispense Refill   acetaminophen (TYLENOL) 650 MG CR tablet Take 650 mg by mouth daily as needed for pain.     albuterol (VENTOLIN HFA) 108 (90 Base) MCG/ACT inhaler Inhale 1 puff into the lungs every 6 (six) hours as needed for wheezing or shortness of breath.      albuterol (VENTOLIN HFA) 108 (90 Base) MCG/ACT inhaler Inhale 1-2 puffs into the lungs every 6 (six) hours as needed for wheezing or shortness of breath. 6.7 g 0   Cholecalciferol (VITAMIN D3) 125 MCG (5000 UT) CAPS Take 10,000 Units by mouth daily.     dicyclomine (BENTYL) 20 MG tablet Take 20 mg by mouth 3 (three) times daily.     DULERA 200-5 MCG/ACT AERO Inhale 2 puffs into the lungs in the morning and at bedtime.     ELIQUIS 5 MG TABS tablet TAKE 1 TABLET BY MOUTH TWICE A DAY 60 tablet 5   fexofenadine (ALLEGRA) 180 MG tablet Take 180 mg by mouth daily.     Flaxseed, Linseed, (FLAXSEED OIL) 1000 MG CAPS Take 1 capsule by mouth daily.     fluticasone (FLONASE) 50 MCG/ACT nasal spray Place 2 sprays into both nostrils daily for 14 days. 1 g 0   levothyroxine (SYNTHROID) 150 MCG tablet Take 150 mcg by mouth daily.     lidocaine-prilocaine (EMLA) cream Apply 1 application topically See admin instructions. 1 hour before chemo treatment.     Magnesium Oxide 200 MG  TABS Take 1 tablet by mouth daily.     metFORMIN (GLUCOPHAGE) 500 MG tablet Take 500 mg by mouth 2 (two) times daily.     metoprolol succinate (TOPROL-XL) 50 MG 24 hr tablet Take 50 mg by mouth daily.     OVER THE COUNTER MEDICATION Take 1 capsule by mouth daily. Ferrasorb from [place]; iron with vitamin B and C      pyridoxine (B-6) 100 MG tablet Take 200 mg by mouth daily.     torsemide (DEMADEX) 20 MG tablet Take 1 tablet (20 mg total) by mouth 2 (two) times daily. 60 tablet 11   No  current facility-administered medications for this visit.    Allergies:   Azithromycin, Penicillins, and Sulfa antibiotics   Social History:  The patient  reports that she has never smoked. She has never used smokeless tobacco. She reports that she does not currently use alcohol. She reports that she does not use drugs.   Family History:  The patient's family history includes Diabetes in her paternal grandfather; Heart attack in her father; Heart disease in her father and paternal grandfather; Hypertension in her paternal grandfather; Lung cancer in her father; Throat cancer in her father.    ROS:  Please see the history of present illness.   Otherwise, review of systems is positive for none.   All other systems are reviewed and negative.    PHYSICAL EXAM: VS:  There were no vitals taken for this visit. , BMI There is no height or weight on file to calculate BMI. GEN: Well nourished, well developed, in no acute distress  HEENT: normal  Neck: no JVD, carotid bruits, or masses Cardiac: RRR; no murmurs, rubs, or gallops,no edema  Respiratory:  clear to auscultation bilaterally, normal work of breathing GI: soft, nontender, nondistended, + BS MS: no deformity or atrophy  Skin: warm and dry Neuro:  Strength and sensation are intact Psych: euthymic mood, full affect  EKG:  EKG is not ordered today. Personal review of the ekg ordered 02/09/22 shows sinus rhythm, rate 56  Recent Labs: 01/27/2022: BUN 11; Creatinine, Ser 1.07; Magnesium 1.9; Potassium 4.1; Sodium 140    Lipid Panel     Component Value Date/Time   CHOL 137 08/16/2019 1247   TRIG 72 08/16/2019 1247   HDL 38 (L) 08/16/2019 1247   CHOLHDL 3.6 08/16/2019 1247   VLDL 14 08/16/2019 1247   LDLCALC 85 08/16/2019 1247     Wt Readings from Last 3 Encounters:  01/27/22 (!) 353 lb (160.1 kg)  12/13/21 (!) 349 lb 12.8 oz (158.7 kg)  03/22/21 (!) 340 lb (154.2 kg)      Other studies Reviewed: Additional studies/ records  that were reviewed today include: TTE 2021  Review of the above records today demonstrates:   1. Left ventricular ejection fraction, by estimation, is 60 to 65%. The  left ventricle has normal function. The left ventricle has no regional  wall motion abnormalities. Left ventricular diastolic parameters are  consistent with Grade II diastolic  dysfunction (pseudonormalization). Elevated left ventricular end-diastolic  pressure.   2. Right ventricular systolic function is normal. The right ventricular  size is normal. There is normal pulmonary artery systolic pressure.   3. Left atrial size was mildly dilated.   4. The mitral valve is normal in structure. Trivial mitral valve  regurgitation. No evidence of mitral stenosis.   5. The aortic valve is tricuspid. Aortic valve regurgitation is not  visualized. No aortic stenosis is present.  6. Aortic dilatation noted. 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   7. The inferior vena cava is normal in size with greater than 50%  respiratory variability, suggesting right atrial pressure of 3 mmHg.    ASSESSMENT AND PLAN:  1.  Paroxysmal atrial fibrillation/flutter: Currently on Eliquis 5 mg twice daily, Toprol-XL 50 mg daily.  CHA2DS2-VASc of 2.  She is mainly in sinus rhythm.  She feels that she has had minimal episodes of atrial fibrillation and the metoprolol is helping with her symptoms.  She is feeling somewhat fatigued.  I have asked her to take metoprolol at night.  Otherwise no changes.  2.  Secondary hypercoagulable state: Currently on Eliquis for atrial fibrillation as above  Current medicines are reviewed at length with the patient today.   The patient does not have concerns regarding her medicines.  The following changes were made today:  none  Labs/ tests ordered today include:  No orders of the defined types were placed in this encounter.    Disposition:   FU 12 months  Signed, Franco Duley Meredith Leeds,  MD  05/16/2022 3:07 PM     [facility] [address] [place] [address] [phone] (office) [phone] (fax)

## 2022-06-02 ENCOUNTER — Ambulatory Visit: Payer: Medicare Other | Admitting: Podiatry

## 2022-06-12 ENCOUNTER — Ambulatory Visit: Payer: Medicare Other | Admitting: Podiatry

## 2022-06-12 ENCOUNTER — Ambulatory Visit (INDEPENDENT_AMBULATORY_CARE_PROVIDER_SITE_OTHER): Payer: Medicare Other | Admitting: Podiatry

## 2022-06-12 ENCOUNTER — Encounter: Payer: Self-pay | Admitting: Podiatry

## 2022-06-12 DIAGNOSIS — D689 Coagulation defect, unspecified: Secondary | ICD-10-CM

## 2022-06-12 DIAGNOSIS — M79675 Pain in left toe(s): Secondary | ICD-10-CM | POA: Diagnosis not present

## 2022-06-12 DIAGNOSIS — B351 Tinea unguium: Secondary | ICD-10-CM | POA: Diagnosis not present

## 2022-06-12 DIAGNOSIS — M79674 Pain in right toe(s): Secondary | ICD-10-CM

## 2022-06-12 NOTE — Progress Notes (Signed)
This patient presents to my office for at risk foot care.  This patient requires this care by a professional since this patient will be at risk due to having coagulation defect due to eliquis.  This patient is unable to cut nails herself since the patient cannot reach her nails.These nails are painful walking and wearing shoes.  This patient presents for at risk foot care today.  General Appearance  Alert, conversant and in no acute stress.  Vascular  Dorsalis pedis and posterior tibial  pulses are  not palpable due to swelling   bilaterally.  Capillary return is within normal limits  bilaterally. Temperature is within normal limits  bilaterally.  Neurologic  Senn-Weinstein monofilament wire test within normal limits  bilaterally. Muscle power within normal limits bilaterally.  Nails Thick disfigured discolored nails with subungual debris  from hallux to fifth toes bilaterally. No evidence of bacterial infection or drainage bilaterally.  Orthopedic  No limitations of motion  feet .  No crepitus or effusions noted.  No bony pathology or digital deformities noted.  Skin  normotropic skin with no porokeratosis noted bilaterally.  No signs of infections or ulcers noted.     Onychomycosis  Pain in right toes  Pain in left toes  Consent was obtained for treatment procedures.   Mechanical debridement of nails 1-5  bilaterally performed with a nail nipper.  Filed with dremel without incident.    Return office visit    3 months                  Told patient to return for periodic foot care and evaluation due to potential at risk complications.   [name] DPM   

## 2022-09-04 ENCOUNTER — Encounter: Payer: Self-pay | Admitting: Podiatry

## 2022-09-04 ENCOUNTER — Ambulatory Visit (INDEPENDENT_AMBULATORY_CARE_PROVIDER_SITE_OTHER): Payer: Medicare Other | Admitting: Podiatry

## 2022-09-04 DIAGNOSIS — M79674 Pain in right toe(s): Secondary | ICD-10-CM | POA: Diagnosis not present

## 2022-09-04 DIAGNOSIS — B351 Tinea unguium: Secondary | ICD-10-CM

## 2022-09-04 DIAGNOSIS — M79675 Pain in left toe(s): Secondary | ICD-10-CM

## 2022-09-04 DIAGNOSIS — D689 Coagulation defect, unspecified: Secondary | ICD-10-CM | POA: Diagnosis not present

## 2022-09-04 NOTE — Progress Notes (Signed)
This patient presents to my office for at risk foot care.  This patient requires this care by a professional since this patient will be at risk due to having coagulation defect due to eliquis.  This patient is unable to cut nails herself since the patient cannot reach her nails.These nails are painful walking and wearing shoes.  This patient presents for at risk foot care today.  General Appearance  Alert, conversant and in no acute stress.  Vascular  Dorsalis pedis and posterior tibial  pulses are  not palpable due to swelling   bilaterally.  Capillary return is within normal limits  bilaterally. Temperature is within normal limits  bilaterally.  Neurologic  Senn-Weinstein monofilament wire test within normal limits  bilaterally. Muscle power within normal limits bilaterally.  Nails Thick disfigured discolored nails with subungual debris  from hallux to fifth toes bilaterally. No evidence of bacterial infection or drainage bilaterally.  Orthopedic  No limitations of motion  feet .  No crepitus or effusions noted.  No bony pathology or digital deformities noted.  Skin  normotropic skin with no porokeratosis noted bilaterally.  No signs of infections or ulcers noted.     Onychomycosis  Pain in right toes  Pain in left toes  Consent was obtained for treatment procedures.   Mechanical debridement of nails 1-5  bilaterally performed with a nail nipper.  Filed with dremel without incident.    Return office visit    3 months                  Told patient to return for periodic foot care and evaluation due to potential at risk complications.   Maud Rubendall DPM   

## 2022-12-08 ENCOUNTER — Ambulatory Visit: Payer: Medicare Other | Admitting: Podiatry

## 2022-12-08 DIAGNOSIS — C539 Malignant neoplasm of cervix uteri, unspecified: Secondary | ICD-10-CM | POA: Diagnosis not present

## 2022-12-08 DIAGNOSIS — C538 Malignant neoplasm of overlapping sites of cervix uteri: Secondary | ICD-10-CM | POA: Diagnosis not present

## 2022-12-08 DIAGNOSIS — Z08 Encounter for follow-up examination after completed treatment for malignant neoplasm: Secondary | ICD-10-CM | POA: Diagnosis not present

## 2022-12-08 DIAGNOSIS — Z8541 Personal history of malignant neoplasm of cervix uteri: Secondary | ICD-10-CM | POA: Diagnosis not present

## 2022-12-12 ENCOUNTER — Ambulatory Visit: Payer: Medicare Other | Admitting: Podiatry

## 2022-12-20 ENCOUNTER — Encounter: Payer: Self-pay | Admitting: Podiatry

## 2022-12-20 ENCOUNTER — Ambulatory Visit (INDEPENDENT_AMBULATORY_CARE_PROVIDER_SITE_OTHER): Payer: Medicare HMO | Admitting: Podiatry

## 2022-12-20 DIAGNOSIS — B351 Tinea unguium: Secondary | ICD-10-CM

## 2022-12-20 DIAGNOSIS — D689 Coagulation defect, unspecified: Secondary | ICD-10-CM

## 2022-12-20 DIAGNOSIS — M79674 Pain in right toe(s): Secondary | ICD-10-CM

## 2022-12-20 DIAGNOSIS — M79675 Pain in left toe(s): Secondary | ICD-10-CM

## 2022-12-20 NOTE — Progress Notes (Signed)
This patient presents to my office for at risk foot care.  This patient requires this care by a professional since this patient will be at risk due to having coagulation defect due to eliquis.  This patient is unable to cut nails herself since the patient cannot reach her nails.These nails are painful walking and wearing shoes.  This patient presents for at risk foot care today.  General Appearance  Alert, conversant and in no acute stress.  Vascular  Dorsalis pedis and posterior tibial  pulses are  not palpable due to swelling   bilaterally.  Capillary return is within normal limits  bilaterally. Temperature is within normal limits  bilaterally.  Neurologic  Senn-Weinstein monofilament wire test within normal limits  bilaterally. Muscle power within normal limits bilaterally.  Nails Thick disfigured discolored nails with subungual debris  from hallux to fifth toes bilaterally. No evidence of bacterial infection or drainage bilaterally.  Orthopedic  No limitations of motion  feet .  No crepitus or effusions noted.  No bony pathology or digital deformities noted.  Skin  normotropic skin with no porokeratosis noted bilaterally.  No signs of infections or ulcers noted.     Onychomycosis  Pain in right toes  Pain in left toes  Consent was obtained for treatment procedures.   Mechanical debridement of nails 1-5  bilaterally performed with a nail nipper.  Filed with dremel without incident.    Return office visit    3 months                  Told patient to return for periodic foot care and evaluation due to potential at risk complications.   Gardiner Barefoot DPM

## 2023-01-08 DIAGNOSIS — N950 Postmenopausal bleeding: Secondary | ICD-10-CM | POA: Diagnosis not present

## 2023-01-08 DIAGNOSIS — C541 Malignant neoplasm of endometrium: Secondary | ICD-10-CM | POA: Diagnosis not present

## 2023-01-08 DIAGNOSIS — C530 Malignant neoplasm of endocervix: Secondary | ICD-10-CM | POA: Diagnosis not present

## 2023-01-08 DIAGNOSIS — Z8541 Personal history of malignant neoplasm of cervix uteri: Secondary | ICD-10-CM | POA: Diagnosis not present

## 2023-01-08 DIAGNOSIS — N858 Other specified noninflammatory disorders of uterus: Secondary | ICD-10-CM | POA: Diagnosis not present

## 2023-01-08 DIAGNOSIS — Z08 Encounter for follow-up examination after completed treatment for malignant neoplasm: Secondary | ICD-10-CM | POA: Diagnosis not present

## 2023-01-08 DIAGNOSIS — Z9221 Personal history of antineoplastic chemotherapy: Secondary | ICD-10-CM | POA: Diagnosis not present

## 2023-01-16 ENCOUNTER — Ambulatory Visit: Payer: Medicare HMO | Attending: Cardiology | Admitting: Cardiology

## 2023-01-16 ENCOUNTER — Encounter: Payer: Self-pay | Admitting: Cardiology

## 2023-01-16 VITALS — BP 136/78 | HR 54 | Ht 70.0 in | Wt 351.0 lb

## 2023-01-16 DIAGNOSIS — I480 Paroxysmal atrial fibrillation: Secondary | ICD-10-CM | POA: Diagnosis not present

## 2023-01-16 DIAGNOSIS — I10 Essential (primary) hypertension: Secondary | ICD-10-CM | POA: Diagnosis not present

## 2023-01-16 DIAGNOSIS — I5032 Chronic diastolic (congestive) heart failure: Secondary | ICD-10-CM

## 2023-01-16 DIAGNOSIS — R0683 Snoring: Secondary | ICD-10-CM

## 2023-01-16 MED ORDER — TORSEMIDE 20 MG PO TABS: 20.0000 mg | ORAL_TABLET | Freq: Two times a day (BID) | ORAL | 3 refills | Status: DC

## 2023-01-16 NOTE — Addendum Note (Signed)
Addended by: Eather Colas L on: 01/16/2023 11:11 AM   Modules accepted: Orders

## 2023-01-16 NOTE — Patient Instructions (Signed)
Medication Instructions:  Your physician has ordered demadex 20 mg twice a day for you.  *If you need a refill on your cardiac medications before your next appointment, please call your pharmacy*   Lab Work: Please make an appointment to complete a BMET and a CBC in our lab in one week.   If you have labs (blood work) drawn today and your tests are completely normal, you will receive your results only by: [place] (if you have MyChart) OR A paper copy in the mail If you have any lab test that is abnormal or we need to change your treatment, we will call you to review the results.   Testing/Procedures: Your physician has recommended that you have a sleep study. This test records several body functions during sleep, including: brain activity, eye movement, oxygen and carbon dioxide blood levels, heart rate and rhythm, breathing rate and rhythm, the flow of air through your mouth and nose, snoring, body muscle movements, and chest and belly movement.    Follow-Up:  Your next appointment:   1 year(s)  Provider:   Fransico Him, MD

## 2023-01-16 NOTE — Progress Notes (Signed)
Date:  01/16/2023   ID:  [name], DOB 1966-04-20, MRN [id number] The patient was identified using 2 identifiers.  PCP:  [name], FNP   [facility] Providers Cardiologist:  Fransico Him, MD Electrophysiologist:  Constance Haw, MD     Evaluation Performed:  Follow-Up Visit  Chief Complaint:  CHF, PAF, HTN  History of Present Illness:    Annalise Profit is a 57 y.o. female with a hx of metastatic cervical cancer with lesions in both lungs undergoing chemotherapy, morbid obesity, HTN and DM.     Admitted 08/16/2019 with an episode of CP relieved with belching but then with palpitations and saw her PCP and dx with afib with RVR and sent to ER. Given IV Lopressor by EMS and converted to NSR. Her CHADS2VASC score is 1 (female) so no indication for long term anticoagulation at that time but ultimately was placed on Eliquis 45m BID for recurrent PAF. 2D echo showed normal LVF with EF > 65% with no wall motion abnormalities. Her afib was felt to possibly be related to paclitaxel treatment. It was recommended that unless she had frequent episodes of PAF would not consider stopping Paclitaxel. Started on Toprol XL. Outpatient coronary CT showed no CAD.   She was seen back by me 03/2020 and was having a lot of problems with DOE as well as chest pressure.  [facility] [place] [facility] [facility] and unfortunately had gained over 80lbs.  Her SOB had gotten progressively worse and  had worsening LE pitting edema.  Her Lasix was increased to 448mBID for acute on chronic diastolic CHF.  2D echo showed normal LVF with EF 60-65% and G2DD, mild LAE, LE venous dopplers with no DVT and VQ scan with no DVT.     She was seen back by LoTruitt MerleNP 04/27/2020 and her breathing and LE edema had improved.  [place] is on lifelong chemo [place] She has lung mets from her  cervical CA and extensive adenopathy in the abdominal and pelvic cavities.  When I last saw her she had been placed on Ketruda and was battleling a lot of side effects.   2D echo 11/2021 showed EF 55 to 60% with grade 2 diastolic dysfunction.  Since I saw her last she [facility]  She is here today for followup and is doing well.  She denies any chest pain or pressure, SOB, DOE, PND, orthopnea,  palpitations or syncope. She has some LE edema from time to time. She randomly will have a dizzy spell that she thinks is related to sinus issues.  She is compliant with her meds and is tolerating meds with no SE.    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   Current Meds  Medication Sig   acetaminophen (TYLENOL) 650 MG CR tablet Take 650 mg by mouth daily as needed for pain.   albuterol (VENTOLIN HFA) 108 (90 Base) MCG/ACT inhaler Inhale 1-2 puffs into the lungs every 6 (six) hours as needed for wheezing or shortness of breath.   Cholecalciferol (VITAMIN D3) 125 MCG (5000 UT) CAPS Take 10,000 Units by mouth daily.   dicyclomine (BENTYL) 20 MG tablet Take 20 mg by mouth 3 (three) times daily.   DULERA 200-5 MCG/ACT AERO Inhale 2 puffs into the lungs in the morning and at bedtime.   ELIQUIS 5 MG TABS tablet TAKE 1 TABLET BY MOUTH TWICE A DAY    fexofenadine (ALLEGRA) 180 MG tablet Take 180 mg by mouth daily.   fluticasone (FLONASE) 50 MCG/ACT nasal spray Place 2 sprays into both nostrils daily for 14 days.   levothyroxine (SYNTHROID) 150 MCG tablet Take 150 mcg by mouth daily.   lidocaine-prilocaine (EMLA) cream Apply 1 application topically See admin instructions. 1 hour before chemo treatment.   Magnesium Oxide 200 MG TABS Take 1 tablet by mouth daily.   metFORMIN (GLUCOPHAGE) 500 MG tablet Take 500 mg by mouth 2 (two) times daily.   metoprolol succinate (TOPROL-XL) 50 MG 24 hr tablet Take 50 mg by mouth daily.   pyridoxine (B-6) 100 MG tablet Take 200 mg by mouth daily.     Allergies:   Azithromycin, Penicillins, and Sulfa antibiotics   Social History   Tobacco Use   Smoking status: Never   Smokeless tobacco: Never  Vaping Use   Vaping Use: Never used  Substance Use Topics   Alcohol use: Not Currently    Comment: occasionally   Drug use: Never     Family Hx: The patient's family history includes Diabetes in her paternal grandfather; Heart attack in her father; Heart disease in her father and paternal grandfather; Hypertension in her paternal grandfather; Lung cancer in her father; Throat cancer in her father. There is no history of Breast cancer, Ovarian cancer, or Colon cancer.  ROS:   Please see the history of present illness.     All other systems reviewed and are negative.   Prior CV studies:   The following studies were reviewed today:  none  Labs/Other Tests and Data Reviewed:    EKG:  sinus bradycardia at 54bpm with latera T wave abnormality. Unchanged from 02/2022  Recent Labs: 01/27/2022: BUN 11; Creatinine, Ser 1.07; Magnesium 1.9; Potassium 4.1; Sodium 140   Recent Lipid Panel Lab Results  Component Value Date/Time   CHOL 137 08/16/2019 12:47 PM   TRIG 72 08/16/2019 12:47 PM   HDL 38 (L) 08/16/2019 12:47 PM   CHOLHDL 3.6 08/16/2019 12:47 PM   LDLCALC 85 08/16/2019 12:47 PM    Wt Readings  from Last 3 Encounters:  01/16/23 (!) 351 lb (159.2 kg)  05/16/22 (!) 358 lb (162.4 kg)  01/27/22 (!) 353 lb (160.1 kg)     Risk Assessment/Calculations:    CHA2DS2-VASc Score =   3 (female, HTN, CHF) This indicates a  % annual risk of stroke. The patient's score is based upon:     Objective:    Vital Signs:  BP 136/78   Pulse (!) 54   Ht 5' 10"$  (1.778 m)   Wt (!) 351 lb (159.2 kg)   SpO2 95%   BMI 50.36 kg/m   GEN:  Well nourished, well developed in no acute distress HEENT: Normal NECK: No JVD; No carotid bruits LYMPHATICS: No lymphadenopathy CARDIAC:RRR, no murmurs, rubs, gallops RESPIRATORY:  Clear to auscultation without rales, wheezing or rhonchi  ABDOMEN: Soft, non-tender, non-distended MUSCULOSKELETAL:  2+ B/L LE edema; No deformity  SKIN: Warm and dry NEUROLOGIC:  Alert and oriented x 3 PSYCHIATRIC:  Normal affect  ASSESSMENT & PLAN:    1.  PAF -She is maintaining normal sinus rhythm on exam today and denies any palpitations -CHADS2VASC score is 2 (female and HTN) and she has not had any bleeding problems on Eliquis -Continue prescription drug management with Eliquis 5 mg twice daily and Toprol-XL 50 mg daily with as needed refills -check BMET and CBC  2. HTN -BP is controlled on exam today -Continue prescription drug management with Toprol-XL 50 mg daily with as needed refills  3.  Chronic diastolic CHF -felt to be multifactorial from obesity, sedentary lifestyle, lung mets from her cervical CA and extensive adenopathy in the abdomen, asthma/COPD and diastolic CHF  -her last echo 04/2020 showed normal LVF with G2DD and mild LAE -she has a hx of pericardial effusion that resolved on echo in May 2021 -echo 12/22 with EF 55-60% with G2DD -She has 2+ LE edema on exam>>she has demadex prescribed but has not been taking it>>I encouraged her to take the Demadex 56m BID for LE edema -check BMET and CBC in 1 week -She has not required any diuretics recently  4.   Snoring -she has been told that she snores loudly and has problems feeling tired during the day -Stop Bang score 5 -will get an in lab PSG        Time:   Today, I have spent 20 minutes with the patient with telehealth technology discussing the above problems.     Medication Adjustments/Labs and Tests Ordered: Current medicines are reviewed at length with the patient today.  Concerns regarding medicines are outlined above.   Tests Ordered: Orders Placed This Encounter  Procedures   EKG 12-Lead    Medication Changes: No orders of the defined types were placed in this encounter.   Follow Up:  In Person in 1 year(s)  Signed, TFransico Him MD  01/16/2023 10:58 AM    [place]

## 2023-01-23 ENCOUNTER — Other Ambulatory Visit: Payer: Medicare HMO

## 2023-02-05 DIAGNOSIS — C541 Malignant neoplasm of endometrium: Secondary | ICD-10-CM | POA: Diagnosis not present

## 2023-02-05 DIAGNOSIS — C530 Malignant neoplasm of endocervix: Secondary | ICD-10-CM | POA: Diagnosis not present

## 2023-02-15 ENCOUNTER — Encounter: Payer: Self-pay | Admitting: Cardiology

## 2023-02-15 DIAGNOSIS — C7801 Secondary malignant neoplasm of right lung: Secondary | ICD-10-CM | POA: Diagnosis not present

## 2023-02-15 DIAGNOSIS — C7802 Secondary malignant neoplasm of left lung: Secondary | ICD-10-CM | POA: Diagnosis not present

## 2023-02-19 DIAGNOSIS — E041 Nontoxic single thyroid nodule: Secondary | ICD-10-CM | POA: Diagnosis not present

## 2023-02-19 DIAGNOSIS — Z0000 Encounter for general adult medical examination without abnormal findings: Secondary | ICD-10-CM | POA: Diagnosis not present

## 2023-02-19 DIAGNOSIS — C539 Malignant neoplasm of cervix uteri, unspecified: Secondary | ICD-10-CM | POA: Diagnosis not present

## 2023-02-19 DIAGNOSIS — R911 Solitary pulmonary nodule: Secondary | ICD-10-CM | POA: Diagnosis not present

## 2023-02-19 DIAGNOSIS — J4530 Mild persistent asthma, uncomplicated: Secondary | ICD-10-CM | POA: Diagnosis not present

## 2023-02-19 DIAGNOSIS — E039 Hypothyroidism, unspecified: Secondary | ICD-10-CM | POA: Diagnosis not present

## 2023-02-19 DIAGNOSIS — C55 Malignant neoplasm of uterus, part unspecified: Secondary | ICD-10-CM | POA: Diagnosis not present

## 2023-02-21 DIAGNOSIS — I110 Hypertensive heart disease with heart failure: Secondary | ICD-10-CM | POA: Diagnosis not present

## 2023-02-21 DIAGNOSIS — N8003 Adenomyosis of the uterus: Secondary | ICD-10-CM | POA: Diagnosis not present

## 2023-02-21 DIAGNOSIS — J45909 Unspecified asthma, uncomplicated: Secondary | ICD-10-CM | POA: Diagnosis not present

## 2023-02-21 DIAGNOSIS — Z7951 Long term (current) use of inhaled steroids: Secondary | ICD-10-CM | POA: Diagnosis not present

## 2023-02-21 DIAGNOSIS — Z79899 Other long term (current) drug therapy: Secondary | ICD-10-CM | POA: Diagnosis not present

## 2023-02-21 DIAGNOSIS — C7802 Secondary malignant neoplasm of left lung: Secondary | ICD-10-CM | POA: Diagnosis not present

## 2023-02-21 DIAGNOSIS — Z79890 Hormone replacement therapy (postmenopausal): Secondary | ICD-10-CM | POA: Diagnosis not present

## 2023-02-21 DIAGNOSIS — E039 Hypothyroidism, unspecified: Secondary | ICD-10-CM | POA: Diagnosis not present

## 2023-02-21 DIAGNOSIS — Z6841 Body Mass Index (BMI) 40.0 and over, adult: Secondary | ICD-10-CM | POA: Diagnosis not present

## 2023-02-21 DIAGNOSIS — K429 Umbilical hernia without obstruction or gangrene: Secondary | ICD-10-CM | POA: Diagnosis not present

## 2023-02-21 DIAGNOSIS — Z8541 Personal history of malignant neoplasm of cervix uteri: Secondary | ICD-10-CM | POA: Diagnosis not present

## 2023-02-21 DIAGNOSIS — E119 Type 2 diabetes mellitus without complications: Secondary | ICD-10-CM | POA: Diagnosis not present

## 2023-02-21 DIAGNOSIS — Z7984 Long term (current) use of oral hypoglycemic drugs: Secondary | ICD-10-CM | POA: Diagnosis not present

## 2023-02-21 DIAGNOSIS — C541 Malignant neoplasm of endometrium: Secondary | ICD-10-CM | POA: Diagnosis not present

## 2023-02-21 DIAGNOSIS — C7801 Secondary malignant neoplasm of right lung: Secondary | ICD-10-CM | POA: Diagnosis not present

## 2023-02-21 DIAGNOSIS — G43909 Migraine, unspecified, not intractable, without status migrainosus: Secondary | ICD-10-CM | POA: Diagnosis not present

## 2023-02-21 DIAGNOSIS — C7982 Secondary malignant neoplasm of genital organs: Secondary | ICD-10-CM | POA: Diagnosis not present

## 2023-02-21 DIAGNOSIS — I4891 Unspecified atrial fibrillation: Secondary | ICD-10-CM | POA: Diagnosis not present

## 2023-02-21 DIAGNOSIS — I5031 Acute diastolic (congestive) heart failure: Secondary | ICD-10-CM | POA: Diagnosis not present

## 2023-02-21 DIAGNOSIS — N8502 Endometrial intraepithelial neoplasia [EIN]: Secondary | ICD-10-CM | POA: Diagnosis not present

## 2023-02-27 ENCOUNTER — Encounter: Payer: Self-pay | Admitting: Cardiology

## 2023-02-27 MED ORDER — METOPROLOL SUCCINATE ER 50 MG PO TB24: 50.0000 mg | ORAL_TABLET | Freq: Every day | ORAL | 3 refills | Status: DC

## 2023-03-04 NOTE — Progress Notes (Signed)
Synopsis: Referred for pulmonary nodules by Linus Galas, NP  Subjective:   PATIENT ID: Debra Ware GENDER: female DOB: 01-May-1966, MRN: [id number]  Chief Complaint  Patient presents with   Pulmonary Consult    Referred by Linus Galas, NP for eval of abnormal PET. She was dxed with metastatic cervical CA.  She has had asthma for years. She has seasonal allergies.    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 She does feel like she got better response to both singulair and advair diskus.    Next surveillance imaging TBD - sees gyn onc 03/19/23 at [facility] in [place].   Otherwise pertinent review of systems is negative.  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 Never smoker. No mj/vaping.   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/2019   Chronic diastolic CHF (congestive heart failure)    Hypertension    Hypothyroidism (acquired)    Metastatic cancer    Mild dilation of ascending aorta    Morbid obesity with BMI of 40.0-44.9, adult 06/16/2019   Ovarian cancer    PAF (paroxysmal atrial fibrillation) 08/16/2019   Secondary malignant neoplasm of both lungs 06/16/2019   Secondary malignant neoplasm of iliac lymph nodes 06/16/2019   Secondary malignant neoplasm of intra-abdominal lymph nodes 06/16/2019     Family History  Problem Relation Age of Onset   Heart attack Father    Lung cancer Father    Throat cancer  Father    Heart disease Father    Asthma Sister    Diabetes Paternal Grandfather    Heart disease Paternal Grandfather    Hypertension Paternal Grandfather    Ovarian cancer Maternal Aunt    Breast cancer Neg Hx    Colon cancer Neg Hx      No past surgical history on file.  Social History   Socioeconomic History   Marital status: Single    Spouse name: Not on file   Number of children: Not on file   Years of education: Not on file   Highest education level: Not on file  Occupational History   Not on file  Tobacco Use   Smoking status: Never    Passive exposure: Past   Smokeless tobacco: Never  Vaping Use   Vaping Use: Never used  Substance and Sexual Activity   Alcohol use: Not Currently    Comment: occasionally   Drug use: Never   Sexual activity: Not Currently  Other Topics Concern   Not on file  Social History Narrative   Not on file   Social Determinants of Health   Financial Resource Strain: Not on file  Food Insecurity: Not on file  Transportation Needs: Not on file  Physical Activity: Not on file  Stress: Not on file  Social Connections: Not on file  Intimate Partner Violence: Not on file     Allergies  Allergen Reactions   Azithromycin Hives   Penicillins  Rash    Did it involve swelling of the face/tongue/throat, SOB, or low BP? Yes Did it involve sudden or severe rash/hives, skin peeling, or any reaction on the inside of your mouth or nose? No Did you need to seek medical attention at a hospital or doctor's office? No When did it last happen?   unknown    If all above answers are "NO", may proceed with cephalosporin use.;   Sulfa Antibiotics Rash     Outpatient Medications Prior to Visit  Medication Sig Dispense Refill   acetaminophen (TYLENOL) 650 MG CR tablet Take 650 mg by mouth daily as needed for pain.     albuterol (VENTOLIN HFA) 108 (90 Base) MCG/ACT inhaler Inhale 1-2 puffs into the lungs every 6 (six) hours as needed for wheezing or  shortness of breath. 6.7 g 0   Cholecalciferol (VITAMIN D3) 125 MCG (5000 UT) CAPS Take 10,000 Units by mouth daily.     dicyclomine (BENTYL) 20 MG tablet Take 20 mg by mouth 3 (three) times daily.     DULERA 200-5 MCG/ACT AERO Inhale 2 puffs into the lungs in the morning and at bedtime.     ELIQUIS 5 MG TABS tablet TAKE 1 TABLET BY MOUTH TWICE A DAY 60 tablet 5   fexofenadine (ALLEGRA) 180 MG tablet Take 180 mg by mouth daily.     levothyroxine (SYNTHROID) 150 MCG tablet Take 150 mcg by mouth daily.     Magnesium Oxide 200 MG TABS Take 1 tablet by mouth daily.     metFORMIN (GLUCOPHAGE) 500 MG tablet Take 500 mg by mouth 2 (two) times daily.     metoprolol succinate (TOPROL-XL) 50 MG 24 hr tablet Take 1 tablet (50 mg total) by mouth daily. 90 tablet 3   pyridoxine (B-6) 100 MG tablet Take 200 mg by mouth daily.     fluticasone (FLONASE) 50 MCG/ACT nasal spray Place 2 sprays into both nostrils daily for 14 days. 1 g 0   lidocaine-prilocaine (EMLA) cream Apply 1 application topically See admin instructions. 1 hour before chemo treatment.     torsemide (DEMADEX) 20 MG tablet Take 1 tablet (20 mg total) by mouth 2 (two) times daily. 180 tablet 3   No facility-administered medications prior to visit.       Objective:   Physical Exam:  General appearance: 57 y.o., female, NAD, conversant  Eyes: anicteric sclerae; PERRL, tracking appropriately HENT: NCAT; MMM Neck: Trachea midline; no lymphadenopathy, no JVD Lungs: CTAB, no crackles, no wheeze, with normal respiratory effort CV: RRR, no murmur  Abdomen: Soft, non-tender; non-distended, BS present  Extremities: No peripheral edema, warm Skin: Normal turgor and texture; no rash Psych: Appropriate affect Neuro: Alert and oriented to person and place, no focal deficit     Vitals:   03/06/23 1446  BP: 134/80  Pulse: 80  Temp: 98.2 F (36.8 C)  TempSrc: Oral  SpO2: 96%   96% on RA BMI Readings from Last 3 Encounters:  01/16/23  50.36 kg/m  05/16/22 51.37 kg/m  01/27/22 52.13 kg/m   Wt Readings from Last 3 Encounters:  01/16/23 (!) 351 lb (159.2 kg)  05/16/22 (!) 358 lb (162.4 kg)  01/27/22 (!) 353 lb (160.1 kg)     CBC    Component Value Date/Time   WBC 10.0 03/22/2021 2133   RBC 4.70 03/22/2021 2133   HGB 13.1 03/22/2021 2133   HGB 13.3 08/20/2020 1144   HCT 40.7 03/22/2021 2133   HCT 39.3 08/20/2020 1144  PLT 152 03/22/2021 2133   PLT 179 08/20/2020 1144   MCV 86.6 03/22/2021 2133   MCV 86 08/20/2020 1144   MCH 27.9 03/22/2021 2133   MCHC 32.2 03/22/2021 2133   RDW 14.6 03/22/2021 2133   RDW 13.1 08/20/2020 1144   LYMPHSABS 0.7 03/22/2021 2133   MONOABS 1.2 (H) 03/22/2021 2133   EOSABS 0.1 03/22/2021 2133   BASOSABS 0.0 03/22/2021 2133     Chest Imaging: PET/CT 12/08/22 with hypermetabolic activity in endometrial activity. Calcified mediastinal nodes.  Pulmonary Functions Testing Results:     No data to display           Echocardiogram 2021:    1. Left ventricular ejection fraction, by estimation, is 60 to 65%. The  left ventricle has normal function. The left ventricle has no regional  wall motion abnormalities. Left ventricular diastolic parameters are  consistent with Grade II diastolic  dysfunction (pseudonormalization). Elevated left ventricular end-diastolic  pressure.   2. Right ventricular systolic function is normal. The right ventricular  size is normal. There is normal pulmonary artery systolic pressure.   3. Left atrial size was mildly dilated.   4. The mitral valve is normal in structure. Trivial mitral valve  regurgitation. No evidence of mitral stenosis.   5. The aortic valve is tricuspid. Aortic valve regurgitation is not  visualized. No aortic stenosis is present.   6. Aortic dilatation noted. 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   7. The inferior vena cava is normal in size with greater than 50%  respiratory variability,  suggesting right atrial pressure of 3 mmHg.       Assessment & Plan:   # History of cervical cancer with metastatic pulmonary nodules s/p LUL biopsy 2020 at [facility] [place] in [place]  # endometrial adenoca dx 01/08/23 underwent resection with TLH, BSO, SLNMB 02/21/23  # history of moderate persistent asthma   # morning headaches # At risk for OSA  Plan: - would suggest daily nasal irrigation with neti pot and salt packet or shower and then gently clear nose of crusting, then flonase 2 sprays daily for a week then 1 spray daily at least thru pollen season - would try switching nonsedating second generation antihistamine from zyrtec to an alternative like claritin or allegra and would take this through allergy season - agree with checking sleep study to make sure OSA isn't playing role with headaches. It can also make asthma harder to control - I'll check in to prices for dry powder inhalers for asthma, singulair, for now continue dulera as you are - see you in 3 months or sooner with breathing tests at next appointment - defer to gyn onc regarding timing of surveillance imaging for history of metastatic pulmonary nodules. I think having at least a CT Chest by [facility] 2025 would be reasonable but they may want imaging sooner.     Omar Person, MD [facility] 03/06/2023 4:27 PM

## 2023-03-06 ENCOUNTER — Ambulatory Visit (INDEPENDENT_AMBULATORY_CARE_PROVIDER_SITE_OTHER): Payer: Medicare HMO | Admitting: Student

## 2023-03-06 ENCOUNTER — Encounter: Payer: Self-pay | Admitting: Student

## 2023-03-06 ENCOUNTER — Telehealth: Payer: Self-pay | Admitting: Student

## 2023-03-06 VITALS — BP 134/80 | HR 80 | Temp 98.2°F

## 2023-03-06 DIAGNOSIS — J4540 Moderate persistent asthma, uncomplicated: Secondary | ICD-10-CM

## 2023-03-06 DIAGNOSIS — E041 Nontoxic single thyroid nodule: Secondary | ICD-10-CM | POA: Diagnosis not present

## 2023-03-06 DIAGNOSIS — E039 Hypothyroidism, unspecified: Secondary | ICD-10-CM | POA: Diagnosis not present

## 2023-03-06 NOTE — Patient Instructions (Addendum)
-   would suggest daily nasal irrigation with neti pot and salt packet or shower and then gently clear nose of crusting, then flonase 2 sprays daily for a week then 1 spray daily at least thru pollen season - would try switching nonsedating second generation antihistamine from zyrtec to an alternative like claritin or allegra and would take this through allergy season - agree with checking sleep study to make sure OSA isn't playing role with headaches. It can also make asthma harder to control - I'll check in to prices for dry powder inhalers for asthma, singulair, for now continue dulera as you are - see you in 3 months or sooner with breathing tests at next appointment - defer to gyn onc regarding timing of surveillance imaging for history of metastatic pulmonary nodules. I think having at least a CT Chest by [facility] 2025 would be reasonable but they may want imaging sooner.

## 2023-03-06 NOTE — Telephone Encounter (Signed)
What is least expensive laba/ics DPI inhaler for her (she prefers DPI)? Any idea what singulair would cost her?  Thanks!

## 2023-03-08 ENCOUNTER — Other Ambulatory Visit (HOSPITAL_COMMUNITY): Payer: Self-pay

## 2023-03-08 NOTE — Telephone Encounter (Signed)
Breo- $4.60 Wixela- $1.55

## 2023-03-09 MED ORDER — FLUTICASONE-SALMETEROL 250-50 MCG/ACT IN AEPB: 1.0000 | INHALATION_SPRAY | Freq: Two times a day (BID) | RESPIRATORY_TRACT | 11 refills | Status: AC

## 2023-03-23 ENCOUNTER — Ambulatory Visit (INDEPENDENT_AMBULATORY_CARE_PROVIDER_SITE_OTHER): Payer: Medicare HMO | Admitting: Podiatry

## 2023-03-23 ENCOUNTER — Encounter: Payer: Self-pay | Admitting: Podiatry

## 2023-03-23 DIAGNOSIS — D689 Coagulation defect, unspecified: Secondary | ICD-10-CM | POA: Diagnosis not present

## 2023-03-23 DIAGNOSIS — B351 Tinea unguium: Secondary | ICD-10-CM | POA: Diagnosis not present

## 2023-03-23 DIAGNOSIS — M79674 Pain in right toe(s): Secondary | ICD-10-CM

## 2023-03-23 DIAGNOSIS — M79675 Pain in left toe(s): Secondary | ICD-10-CM

## 2023-03-23 NOTE — Progress Notes (Signed)
This patient presents to my office for at risk foot care.  This patient requires this care by a professional since this patient will be at risk due to having coagulation defect due to eliquis.  This patient is unable to cut nails herself since the patient cannot reach her nails.These nails are painful walking and wearing shoes.  This patient presents for at risk foot care today.  General Appearance  Alert, conversant and in no acute stress.  Vascular  Dorsalis pedis and posterior tibial  pulses are  not palpable due to swelling   bilaterally.  Capillary return is within normal limits  bilaterally. Temperature is within normal limits  bilaterally.  Neurologic  Senn-Weinstein monofilament wire test within normal limits  bilaterally. Muscle power within normal limits bilaterally.  Nails Thick disfigured discolored nails with subungual debris  from hallux to fifth toes bilaterally. No evidence of bacterial infection or drainage bilaterally.  Orthopedic  No limitations of motion  feet .  No crepitus or effusions noted.  No bony pathology or digital deformities noted.  Skin  normotropic skin with no porokeratosis noted bilaterally.  No signs of infections or ulcers noted.     Onychomycosis  Pain in right toes  Pain in left toes  Consent was obtained for treatment procedures.   Mechanical debridement of nails 1-5  bilaterally performed with a nail nipper.  Filed with dremel without incident.    Return office visit    3 months                  Told patient to return for periodic foot care and evaluation due to potential at risk complications.   Lanyla Costello DPM   

## 2023-04-10 ENCOUNTER — Ambulatory Visit (HOSPITAL_BASED_OUTPATIENT_CLINIC_OR_DEPARTMENT_OTHER): Payer: Medicare HMO | Attending: Cardiology | Admitting: Cardiology

## 2023-04-20 DIAGNOSIS — D6869 Other thrombophilia: Secondary | ICD-10-CM | POA: Diagnosis not present

## 2023-04-20 DIAGNOSIS — E1151 Type 2 diabetes mellitus with diabetic peripheral angiopathy without gangrene: Secondary | ICD-10-CM | POA: Diagnosis not present

## 2023-04-20 DIAGNOSIS — Z809 Family history of malignant neoplasm, unspecified: Secondary | ICD-10-CM | POA: Diagnosis not present

## 2023-04-20 DIAGNOSIS — Z008 Encounter for other general examination: Secondary | ICD-10-CM | POA: Diagnosis not present

## 2023-04-20 DIAGNOSIS — K581 Irritable bowel syndrome with constipation: Secondary | ICD-10-CM | POA: Diagnosis not present

## 2023-04-20 DIAGNOSIS — Z823 Family history of stroke: Secondary | ICD-10-CM | POA: Diagnosis not present

## 2023-04-20 DIAGNOSIS — Z825 Family history of asthma and other chronic lower respiratory diseases: Secondary | ICD-10-CM | POA: Diagnosis not present

## 2023-04-20 DIAGNOSIS — I872 Venous insufficiency (chronic) (peripheral): Secondary | ICD-10-CM | POA: Diagnosis not present

## 2023-04-20 DIAGNOSIS — Z7984 Long term (current) use of oral hypoglycemic drugs: Secondary | ICD-10-CM | POA: Diagnosis not present

## 2023-04-20 DIAGNOSIS — Z79899 Other long term (current) drug therapy: Secondary | ICD-10-CM | POA: Diagnosis not present

## 2023-04-20 DIAGNOSIS — I110 Hypertensive heart disease with heart failure: Secondary | ICD-10-CM | POA: Diagnosis not present

## 2023-04-20 DIAGNOSIS — Z8249 Family history of ischemic heart disease and other diseases of the circulatory system: Secondary | ICD-10-CM | POA: Diagnosis not present

## 2023-04-20 DIAGNOSIS — E039 Hypothyroidism, unspecified: Secondary | ICD-10-CM | POA: Diagnosis not present

## 2023-04-20 DIAGNOSIS — I509 Heart failure, unspecified: Secondary | ICD-10-CM | POA: Diagnosis not present

## 2023-04-20 DIAGNOSIS — E119 Type 2 diabetes mellitus without complications: Secondary | ICD-10-CM | POA: Diagnosis not present

## 2023-04-20 DIAGNOSIS — I4891 Unspecified atrial fibrillation: Secondary | ICD-10-CM | POA: Diagnosis not present

## 2023-04-20 DIAGNOSIS — J45909 Unspecified asthma, uncomplicated: Secondary | ICD-10-CM | POA: Diagnosis not present

## 2023-05-15 DIAGNOSIS — Z0000 Encounter for general adult medical examination without abnormal findings: Secondary | ICD-10-CM | POA: Diagnosis not present

## 2023-05-15 DIAGNOSIS — E039 Hypothyroidism, unspecified: Secondary | ICD-10-CM | POA: Diagnosis not present

## 2023-05-15 DIAGNOSIS — C55 Malignant neoplasm of uterus, part unspecified: Secondary | ICD-10-CM | POA: Diagnosis not present

## 2023-05-22 DIAGNOSIS — R911 Solitary pulmonary nodule: Secondary | ICD-10-CM | POA: Diagnosis not present

## 2023-05-22 DIAGNOSIS — Z8679 Personal history of other diseases of the circulatory system: Secondary | ICD-10-CM | POA: Diagnosis not present

## 2023-05-22 DIAGNOSIS — C539 Malignant neoplasm of cervix uteri, unspecified: Secondary | ICD-10-CM | POA: Diagnosis not present

## 2023-05-22 DIAGNOSIS — E7800 Pure hypercholesterolemia, unspecified: Secondary | ICD-10-CM | POA: Diagnosis not present

## 2023-05-22 DIAGNOSIS — M545 Low back pain, unspecified: Secondary | ICD-10-CM | POA: Diagnosis not present

## 2023-05-22 DIAGNOSIS — Z0000 Encounter for general adult medical examination without abnormal findings: Secondary | ICD-10-CM | POA: Diagnosis not present

## 2023-05-22 DIAGNOSIS — E041 Nontoxic single thyroid nodule: Secondary | ICD-10-CM | POA: Diagnosis not present

## 2023-05-22 DIAGNOSIS — R7301 Impaired fasting glucose: Secondary | ICD-10-CM | POA: Diagnosis not present

## 2023-05-22 DIAGNOSIS — J4530 Mild persistent asthma, uncomplicated: Secondary | ICD-10-CM | POA: Diagnosis not present

## 2023-05-22 DIAGNOSIS — E039 Hypothyroidism, unspecified: Secondary | ICD-10-CM | POA: Diagnosis not present

## 2023-06-04 DIAGNOSIS — C530 Malignant neoplasm of endocervix: Secondary | ICD-10-CM | POA: Diagnosis not present

## 2023-06-04 DIAGNOSIS — C541 Malignant neoplasm of endometrium: Secondary | ICD-10-CM | POA: Diagnosis not present

## 2023-06-05 ENCOUNTER — Ambulatory Visit: Payer: Medicare HMO | Admitting: Nurse Practitioner

## 2023-06-06 ENCOUNTER — Encounter: Payer: Self-pay | Admitting: Nurse Practitioner

## 2023-06-06 ENCOUNTER — Ambulatory Visit: Payer: Medicare HMO | Admitting: Nurse Practitioner

## 2023-06-06 ENCOUNTER — Ambulatory Visit (INDEPENDENT_AMBULATORY_CARE_PROVIDER_SITE_OTHER): Payer: Medicare HMO | Admitting: Student

## 2023-06-06 VITALS — BP 122/74 | HR 56 | Ht 68.5 in | Wt 338.6 lb

## 2023-06-06 DIAGNOSIS — J4540 Moderate persistent asthma, uncomplicated: Secondary | ICD-10-CM

## 2023-06-06 DIAGNOSIS — C7801 Secondary malignant neoplasm of right lung: Secondary | ICD-10-CM

## 2023-06-06 DIAGNOSIS — C7802 Secondary malignant neoplasm of left lung: Secondary | ICD-10-CM | POA: Diagnosis not present

## 2023-06-06 DIAGNOSIS — J302 Other seasonal allergic rhinitis: Secondary | ICD-10-CM | POA: Diagnosis not present

## 2023-06-06 DIAGNOSIS — J309 Allergic rhinitis, unspecified: Secondary | ICD-10-CM | POA: Insufficient documentation

## 2023-06-06 NOTE — Assessment & Plan Note (Signed)
Metastatic cervical cancer. Follow by Gyn Onc every 3 months at [facility]. Repeat CT imaging has not been scheduled. Would anticipate repeat scan by at least January 2025.

## 2023-06-06 NOTE — Assessment & Plan Note (Signed)
Reduced lung function without formal obstruction. No lung volume testing or diffusing capacity testing completed. No evidence of ILD on past imaging. She appears well compensated on current asthma regimen. Action plan in place. Graded exercises encouraged. Continue trigger prevention.   Patient Instructions  Continue Albuterol inhaler 2 puffs every 6 hours as needed for shortness of breath or wheezing. Notify if symptoms persist despite rescue inhaler/neb use.  Continue Wixela 1 puff Twice daily. Brush tongue and rinse mouth afterwards Continue allegra 1 tab daily Continue nasal rinses as needed for congestion You can try flonase nasal spray 2 sprays each nostril daily for nasal congestion  Follow up with new pulmonologist in 6 months. If symptoms worsen, please contact office for sooner follow up or seek emergency care.

## 2023-06-06 NOTE — Patient Instructions (Addendum)
Continue Albuterol inhaler 2 puffs every 6 hours as needed for shortness of breath or wheezing. Notify if symptoms persist despite rescue inhaler/neb use.  Continue Wixela 1 puff Twice daily. Brush tongue and rinse mouth afterwards Continue allegra 1 tab daily Continue nasal rinses as needed for congestion You can try flonase nasal spray 2 sprays each nostril daily for nasal congestion  Follow up with new pulmonologist in 6 months. If symptoms worsen, please contact office for sooner follow up or seek emergency care.

## 2023-06-06 NOTE — Patient Instructions (Signed)
Spiro/DLCO performed today.  

## 2023-06-06 NOTE — Progress Notes (Signed)
Spiro/DLCO performed today.  

## 2023-06-06 NOTE — Assessment & Plan Note (Signed)
Compensated on current regimen °

## 2023-06-06 NOTE — Progress Notes (Signed)
@Patient  ID: Debra Ware, female    DOB: [birth year], 57 y.o.   MRN: [id number]  Chief Complaint  Patient presents with   Follow-up    Pft f/u     Referring provider: Felix Pacini, *  HPI: 57 year old female,never smoker followed for asthma. She is a patient of Dr. Lind Guest and last seen in office 03/06/2023. 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   TEST/EVENTS:  09/06/2022 CT chest/abd/pelvis: multiple stable small pulmonary nodules. Hepatitc steatosis; no new focal liver lesion. No evidence of new metastatic disease. Umbilical hernia. Leiomyomatous uterus.  12/08/2022 PET CT chest/abd/pelvis: no evidence of recurrent cervical mass. Nonspecific hypermetabolic activity in endometrial cavity, likely blood products. No suspicious uptake in lungs.  06/06/2023 PFT: FVC 56, FEV1 55, ratio 80. +BD  03/06/2023: OV with Dr. Thora Lance. Daily sensation of chest tightness, SOB which prompts her to use Dulera. Felt like she had better response to sigulair and Advair diskus. Does have nasal congestion/drainage. Followed by [facility] in [facility] for metastatic cervical cancer. Advised to add on nasal irrigation and flonase during pollen season. Change to alternative second generation antihistamine. Changed to Wixela 1 puff Twice daily. Timing of next CT determined by Gyn Onc; reasonable to expect repeat scan by January 2025.   06/06/2023: Today - follow up Patient presents today for follow up after PFTs. No formal obstruction. She did have positive bronchodilator response. She has been doing better since switching to [employer]. Made it through allergy season without a flare requiring steroids or abx. No recent hospitalizations. Breathing does not limit her activities. Doesn't notice much wheezing. No cough or chest congestion. She hasn't had to use her rescue in quite some time.   Allergies  Allergen Reactions   Azithromycin Hives    Penicillins Rash    Did it involve swelling of the face/tongue/throat, SOB, or low BP? Yes Did it involve sudden or severe rash/hives, skin peeling, or any reaction on the inside of your mouth or nose? No Did you need to seek medical attention at a hospital or doctor's office? No When did it last happen?   unknown    If all above answers are "NO", may proceed with cephalosporin use.;   Sulfa Antibiotics Rash    Immunization History  Administered Date(s) Administered   Influenza,inj,Quad PF,6+ Mos 01/31/2019    Past Medical History:  Diagnosis Date   Asthma    Atrial flutter (HCC)    Cancer of endocervical canal (HCC) 06/16/2019   Chronic diastolic CHF (congestive heart failure) (HCC)    Hypertension    Hypothyroidism (acquired)    Metastatic cancer (HCC)    Mild dilation of ascending aorta (HCC)    Morbid obesity with BMI of 40.0-44.9, adult (HCC) 06/16/2019   Ovarian cancer (HCC)    PAF (paroxysmal atrial fibrillation) (HCC) 08/16/2019   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     albuterol (VENTOLIN HFA) 108 (90 Base)  MCG/ACT inhaler Inhale 1-2 puffs into the lungs every 6 (six) hours as needed for wheezing or shortness of breath. 6.7 g 0   Cholecalciferol (VITAMIN D3) 125 MCG (5000 UT) CAPS Take 10,000 Units by mouth daily.     dicyclomine (BENTYL) 20 MG tablet Take 20 mg by mouth 3 (three) times daily.     ELIQUIS 5 MG TABS tablet TAKE 1 TABLET BY MOUTH TWICE A DAY 60 tablet 5   fexofenadine  (ALLEGRA) 180 MG tablet Take 180 mg by mouth daily.     fluticasone-salmeterol (WIXELA INHUB) 250-50 MCG/ACT AEPB Inhale 1 puff into the lungs in the morning and at bedtime. 1 each 11   levothyroxine (SYNTHROID) 150 MCG tablet Take 150 mcg by mouth daily.     Magnesium Oxide 200 MG TABS Take 1 tablet by mouth daily.     metFORMIN (GLUCOPHAGE) 500 MG tablet Take 500 mg by mouth 2 (two) times daily.     metoprolol succinate (TOPROL-XL) 50 MG 24 hr tablet Take 1 tablet (50 mg total) by mouth daily. 90 tablet 3   pyridoxine (B-6) 100 MG tablet Take 200 mg by mouth daily.     No facility-administered medications prior to visit.     Review of Systems:   Constitutional: No weight loss or gain, night sweats, fevers, chills, or lassitude. +fatigue (baseline) HEENT: No headaches, difficulty swallowing, tooth/dental problems, or sore throat. No sneezing, itching, ear ache, nasal congestion, or post nasal drip CV:  No chest pain, orthopnea, PND, swelling in lower extremities, anasarca, dizziness, palpitations, syncope Resp: +improved shortness of breath with exertion. No excess mucus or change in color of mucus. No productive or non-productive. No hemoptysis. No wheezing.  No chest wall deformity GI:  No heartburn, indigestion Skin: No rash, lesions, ulcerations MSK:  No joint pain or swelling.  Neuro: No dizziness or lightheadedness.  Psych: No depression or anxiety. Mood stable.     Physical Exam:  BP 122/74   Pulse (!) 56   Ht 5' 8.5" (1.74 m)   Wt (!) 338 lb 9.6 oz (153.6 kg)   SpO2 98%   BMI 50.74 kg/m   GEN: Pleasant, interactive, well-kempt; morbidly obese; in no acute distress. HEENT:  Normocephalic and atraumatic. PERRLA. Sclera white. Nasal turbinates pink, moist and patent bilaterally. No rhinorrhea present. Oropharynx pink and moist, without exudate or edema. No lesions, ulcerations, or postnasal drip.  NECK:  Supple w/ fair ROM. No JVD present. Normal carotid impulses w/o  bruits. Thyroid symmetrical with no goiter or nodules palpated. No lymphadenopathy.   CV: RRR, no m/r/g, no peripheral edema. Pulses intact, +2 bilaterally. No cyanosis, pallor or clubbing. PULMONARY:  Unlabored, regular breathing. Clear bilaterally A&P w/o wheezes/rales/rhonchi. No accessory muscle use.  GI: BS present and normoactive. Soft, non-tender to palpation. No organomegaly or masses detected.  MSK: No erythema, warmth or tenderness. Cap refil <2 sec all extrem. No deformities or joint swelling noted.  Neuro: A/Ox3. No focal deficits noted.   Skin: Warm, no lesions or rashe Psych: Normal affect and behavior. Judgement and thought content appropriate.     Lab Results:  CBC    Component Value Date/Time   WBC 10.0 03/22/2021 2133   RBC 4.70 03/22/2021 2133   HGB 13.1 03/22/2021 2133   HGB 13.3 08/20/2020 1144   HCT 40.7 03/22/2021 2133   HCT 39.3 08/20/2020 1144   PLT 152 03/22/2021 2133   PLT 179 08/20/2020 1144   MCV 86.6 03/22/2021 2133   MCV  86 08/20/2020 1144   MCH 27.9 03/22/2021 2133   MCHC 32.2 03/22/2021 2133   RDW 14.6 03/22/2021 2133   RDW 13.1 08/20/2020 1144   LYMPHSABS 0.7 03/22/2021 2133   MONOABS 1.2 (H) 03/22/2021 2133   EOSABS 0.1 03/22/2021 2133   BASOSABS 0.0 03/22/2021 2133    BMET    Component Value Date/Time   NA 140 01/27/2022 1137   K 4.1 01/27/2022 1137   CL 100 01/27/2022 1137   CO2 26 01/27/2022 1137   GLUCOSE 90 01/27/2022 1137   GLUCOSE 107 (H) 03/22/2021 2133   BUN 11 01/27/2022 1137   CREATININE 1.07 (H) 01/27/2022 1137   CALCIUM 9.8 01/27/2022 1137   GFRNONAA 59 (L) 03/22/2021 2133   GFRAA 66 08/20/2020 1144    BNP No results found for: "BNP"   Imaging:  No results found.        No data to display          No results found for: "NITRICOXIDE"      Assessment & Plan:   Moderate persistent asthma Reduced lung function without formal obstruction. No lung volume testing or diffusing capacity testing  completed. No evidence of ILD on past imaging. She appears well compensated on current asthma regimen. Action plan in place. Graded exercises encouraged. Continue trigger prevention.   Patient Instructions  Continue Albuterol inhaler 2 puffs every 6 hours as needed for shortness of breath or wheezing. Notify if symptoms persist despite rescue inhaler/neb use.  Continue Wixela 1 puff Twice daily. Brush tongue and rinse mouth afterwards Continue allegra 1 tab daily Continue nasal rinses as needed for congestion You can try flonase nasal spray 2 sprays each nostril daily for nasal congestion  Follow up with new pulmonologist in 6 months. If symptoms worsen, please contact office for sooner follow up or seek emergency care.    Secondary malignant neoplasm of both lungs (HCC) Metastatic cervical cancer. Follow by Gyn Onc every 3 months at [facility]. Repeat CT imaging has not been scheduled. Would anticipate repeat scan by at least January 2025.   Allergic rhinitis Compensated on current regimen   I spent 28 minutes of dedicated to the care of this patient on the date of this encounter to include pre-visit review of records, face-to-face time with the patient discussing conditions above, post visit ordering of testing, clinical documentation with the electronic health record, making appropriate referrals as documented, and communicating necessary findings to members of the patients care team.  Noemi Chapel, NP 06/06/2023  Pt aware and understands NP's role.

## 2023-06-11 LAB — PULMONARY FUNCTION TEST
FEF 25-75 Post: 2.19 L/sec
FEF 25-75 Pre: 1.4 L/sec
FEF2575-%Change-Post: 56 %
FEF2575-%Pred-Post: 77 %
FEF2575-%Pred-Pre: 49 %
FEV1-%Change-Post: 11 %
FEV1-%Pred-Post: 62 %
FEV1-%Pred-Pre: 55 %
FEV1-Post: 1.95 L
FEV1-Pre: 1.75 L
FEV1FVC-%Change-Post: 4 %
FEV1FVC-%Pred-Pre: 97 %
FEV6-%Change-Post: 7 %
FEV6-%Pred-Post: 62 %
FEV6-%Pred-Pre: 58 %
FEV6-Post: 2.44 L
FEV6-Pre: 2.27 L
FEV6FVC-%Change-Post: 0 %
FEV6FVC-%Pred-Post: 103 %
FEV6FVC-%Pred-Pre: 102 %
FVC-%Change-Post: 6 %
FVC-%Pred-Post: 60 %
FVC-%Pred-Pre: 56 %
FVC-Post: 2.44 L
Post FEV1/FVC ratio: 80 %
Post FEV6/FVC ratio: 100 %
Pre FEV1/FVC ratio: 77 %
Pre FEV6/FVC Ratio: 100 %

## 2023-06-12 DIAGNOSIS — M545 Low back pain, unspecified: Secondary | ICD-10-CM | POA: Diagnosis not present

## 2023-06-22 ENCOUNTER — Ambulatory Visit: Payer: Medicare HMO | Admitting: Podiatry

## 2023-07-02 ENCOUNTER — Ambulatory Visit (INDEPENDENT_AMBULATORY_CARE_PROVIDER_SITE_OTHER): Payer: Medicare HMO | Admitting: Podiatry

## 2023-07-02 ENCOUNTER — Encounter: Payer: Self-pay | Admitting: Podiatry

## 2023-07-02 VITALS — BP 134/69 | HR 60

## 2023-07-02 DIAGNOSIS — M79674 Pain in right toe(s): Secondary | ICD-10-CM

## 2023-07-02 DIAGNOSIS — D689 Coagulation defect, unspecified: Secondary | ICD-10-CM

## 2023-07-02 DIAGNOSIS — B351 Tinea unguium: Secondary | ICD-10-CM | POA: Diagnosis not present

## 2023-07-02 DIAGNOSIS — M79675 Pain in left toe(s): Secondary | ICD-10-CM | POA: Diagnosis not present

## 2023-07-02 NOTE — Progress Notes (Signed)
This patient presents to my office for at risk foot care.  This patient requires this care by a professional since this patient will be at risk due to having coagulation defect due to eliquis.  This patient is unable to cut nails herself since the patient cannot reach her nails.These nails are painful walking and wearing shoes.  This patient presents for at risk foot care today.  General Appearance  Alert, conversant and in no acute stress.  Vascular  Dorsalis pedis and posterior tibial  pulses are  not palpable due to swelling   bilaterally.  Capillary return is within normal limits  bilaterally. Temperature is within normal limits  bilaterally.  Neurologic  Senn-Weinstein monofilament wire test within normal limits  bilaterally. Muscle power within normal limits bilaterally.  Nails Thick disfigured discolored nails with subungual debris  from hallux to fifth toes bilaterally. No evidence of bacterial infection or drainage bilaterally.  Orthopedic  No limitations of motion  feet .  No crepitus or effusions noted.  No bony pathology or digital deformities noted.  Skin  normotropic skin with no porokeratosis noted bilaterally.  No signs of infections or ulcers noted.     Onychomycosis  Pain in right toes  Pain in left toes  Consent was obtained for treatment procedures.   Mechanical debridement of nails 1-5  bilaterally performed with a nail nipper.  Filed with dremel without incident.    Return office visit    3 months                  Told patient to return for periodic foot care and evaluation due to potential at risk complications.   Gardiner Barefoot DPM

## 2023-08-08 DIAGNOSIS — R42 Dizziness and giddiness: Secondary | ICD-10-CM | POA: Diagnosis not present

## 2023-08-10 DIAGNOSIS — R42 Dizziness and giddiness: Secondary | ICD-10-CM | POA: Diagnosis not present

## 2023-08-29 DIAGNOSIS — E039 Hypothyroidism, unspecified: Secondary | ICD-10-CM | POA: Diagnosis not present

## 2023-09-05 ENCOUNTER — Other Ambulatory Visit: Payer: Self-pay | Admitting: Nurse Practitioner

## 2023-09-05 DIAGNOSIS — E039 Hypothyroidism, unspecified: Secondary | ICD-10-CM | POA: Diagnosis not present

## 2023-09-05 DIAGNOSIS — E041 Nontoxic single thyroid nodule: Secondary | ICD-10-CM

## 2023-10-03 ENCOUNTER — Ambulatory Visit: Payer: Medicare HMO | Admitting: Podiatry

## 2023-10-16 ENCOUNTER — Ambulatory Visit
Admission: RE | Admit: 2023-10-16 | Discharge: 2023-10-16 | Disposition: A | Discharge status: Home / Self Care | Payer: Medicare HMO | Source: Ambulatory Visit | Attending: Nurse Practitioner | Admitting: Nurse Practitioner

## 2023-10-16 DIAGNOSIS — E041 Nontoxic single thyroid nodule: Secondary | ICD-10-CM

## 2023-10-16 DIAGNOSIS — E042 Nontoxic multinodular goiter: Secondary | ICD-10-CM | POA: Diagnosis not present

## 2023-10-17 DIAGNOSIS — E039 Hypothyroidism, unspecified: Secondary | ICD-10-CM | POA: Diagnosis not present

## 2023-12-12 DIAGNOSIS — H00021 Hordeolum internum right upper eyelid: Secondary | ICD-10-CM | POA: Diagnosis not present

## 2024-01-11 ENCOUNTER — Encounter: Payer: Self-pay | Admitting: Podiatry

## 2024-01-11 ENCOUNTER — Ambulatory Visit: Payer: Medicare HMO | Admitting: Podiatry

## 2024-01-11 DIAGNOSIS — M79675 Pain in left toe(s): Secondary | ICD-10-CM

## 2024-01-11 DIAGNOSIS — D689 Coagulation defect, unspecified: Secondary | ICD-10-CM

## 2024-01-11 DIAGNOSIS — B351 Tinea unguium: Secondary | ICD-10-CM | POA: Diagnosis not present

## 2024-01-11 DIAGNOSIS — M79674 Pain in right toe(s): Secondary | ICD-10-CM

## 2024-01-11 NOTE — Progress Notes (Signed)
This patient presents to my office for at risk foot care.  This patient requires this care by a professional since this patient will be at risk due to having coagulation defect due to eliquis.  This patient is unable to cut nails herself since the patient cannot reach her nails.These nails are painful walking and wearing shoes.  This patient presents for at risk foot care today.  General Appearance  Alert, conversant and in no acute stress.  Vascular  Dorsalis pedis and posterior tibial  pulses are  not palpable due to swelling   bilaterally.  Capillary return is within normal limits  bilaterally. Temperature is within normal limits  bilaterally.  Neurologic  Senn-Weinstein monofilament wire test within normal limits  bilaterally. Muscle power within normal limits bilaterally.  Nails Thick disfigured discolored nails with subungual debris  from hallux to fifth toes bilaterally. No evidence of bacterial infection or drainage bilaterally.  Orthopedic  No limitations of motion  feet .  No crepitus or effusions noted.  No bony pathology or digital deformities noted.  Skin  normotropic skin with no porokeratosis noted bilaterally.  No signs of infections or ulcers noted.     Onychomycosis  Pain in right toes  Pain in left toes  Consent was obtained for treatment procedures.   Mechanical debridement of nails 1-5  bilaterally performed with a nail nipper.  Filed with dremel without incident.    Return office visit    3 months                  Told patient to return for periodic foot care and evaluation due to potential at risk complications.   Gardiner Barefoot DPM

## 2024-03-10 ENCOUNTER — Encounter: Payer: Self-pay | Admitting: Cardiology

## 2024-03-10 ENCOUNTER — Ambulatory Visit: Payer: Medicare HMO | Attending: Cardiology | Admitting: Cardiology

## 2024-03-10 ENCOUNTER — Telehealth: Payer: Self-pay | Admitting: *Deleted

## 2024-03-10 VITALS — BP 132/88 | HR 80 | Ht 70.0 in | Wt 380.4 lb

## 2024-03-10 DIAGNOSIS — R0683 Snoring: Secondary | ICD-10-CM

## 2024-03-10 DIAGNOSIS — I480 Paroxysmal atrial fibrillation: Secondary | ICD-10-CM | POA: Diagnosis not present

## 2024-03-10 DIAGNOSIS — I5032 Chronic diastolic (congestive) heart failure: Secondary | ICD-10-CM | POA: Diagnosis not present

## 2024-03-10 DIAGNOSIS — I10 Essential (primary) hypertension: Secondary | ICD-10-CM | POA: Diagnosis not present

## 2024-03-10 MED ORDER — FUROSEMIDE 20 MG PO TABS: 20.0000 mg | ORAL_TABLET | Freq: Every day | ORAL | 3 refills | Status: DC

## 2024-03-10 NOTE — Progress Notes (Signed)
 Date:  03/10/2024   ID:  Debra Ware, DOB [birth year], MRN [id number] The patient was identified using 2 identifiers.  PCP:  Debra Pacini, FNP   [facility] Providers Cardiologist:  Debra Magic, MD Electrophysiologist:  Debra Lemming, MD     Evaluation Performed:  Follow-Up Visit  Chief Complaint:  CHF, PAF, HTN  History of Present Illness:    Debra Ware is a 58 y.o. female with a hx of metastatic cervical cancer with lesions in both lungs undergoing chemotherapy, morbid obesity, HTN and DM.     Admitted 08/16/2019 with an episode of CP relieved with belching but then with palpitations and saw her PCP and dx with afib with RVR and sent to ER. Given IV Lopressor by EMS and converted to NSR. Her CHADS2VASC score is 1 (female) so no indication for long term anticoagulation at that time but ultimately was placed on Eliquis 5mg  BID for recurrent PAF. 2D echo showed normal LVF with EF > 65% with no wall motion abnormalities. Her afib was felt to possibly be related to paclitaxel treatment. It was recommended that unless she had frequent episodes of PAF would not consider stopping Paclitaxel. Started on Toprol XL. Outpatient coronary CT showed no CAD.   She was seen back by me 03/2020 and was having a lot of problems with DOE as well as chest pressure.  She is being followed at [facility] and was placed on steroids and unfortunately had gained over 80lbs.  Her SOB had gotten progressively worse and  had worsening LE pitting edema.  Her Lasix was increased to 40mg  BID for acute on chronic diastolic CHF.  2D echo showed normal LVF with EF 60-65% and G2DD, mild LAE, LE venous dopplers with no DVT and VQ scan with no DVT.     She was seen back by Debra Fredrickson, NP 04/27/2020 and her breathing and LE edema had improved.  She is on lifelong chemo and had been followed in  [place] every 3 weeks for chemo through [facility].  She has lung mets from her  cervical CA and extensive adenopathy in the abdominal and pelvic cavities.  2D echo 11/2021 showed EF 55 to 60% with grade 2 diastolic dysfunction.  She subsequently moved her cancer care to [facility] [place].   Since I saw her a year ago she had a hysterectomy in March 2024 and is not going to get any further cancer treatments.  She is feeling much better.    She is here today for followup and is doing well.  She has chronic SOB that is very stable.  She denies any chest pain or pressure,  PND, orthopnea, palpitations or syncope. She has chronic LE edema that she thinks is worse recently. She has had vertigo recently related to seasonal allergies. She is compliant with her meds and is tolerating meds with no SE.    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 No pertinent surgical history.   Current Meds  Medication Sig   acetaminophen (TYLENOL) 650 MG CR tablet Take 650 mg by mouth daily as needed for pain.   albuterol (VENTOLIN HFA) 108 (90 Base) MCG/ACT inhaler Inhale 1-2 puffs into the lungs every 6 (six) hours as needed for wheezing or shortness of breath.   Cholecalciferol (VITAMIN D3) 125 MCG (5000 UT) CAPS Take 10,000 Units by mouth daily.   dicyclomine (BENTYL) 20 MG tablet Take 20 mg by mouth 3 (three) times daily.   ELIQUIS 5 MG TABS tablet TAKE 1 TABLET BY MOUTH TWICE A DAY    fexofenadine (ALLEGRA) 180 MG tablet Take 180 mg by mouth daily.   fluticasone-salmeterol (WIXELA INHUB) 250-50 MCG/ACT AEPB Inhale 1 puff into the lungs in the morning and at bedtime.   levothyroxine (SYNTHROID) 150 MCG tablet Take 150 mcg by mouth daily.   Magnesium Oxide 200 MG TABS Take 1 tablet by mouth daily.   MAGNESIUM PO Take by mouth as needed (When cramping).   metoprolol succinate (TOPROL-XL) 50 MG 24 hr tablet Take 1 tablet (50 mg total) by mouth daily.     Allergies:   Azithromycin, Penicillins, and Sulfa antibiotics   Social History   Tobacco Use   Smoking status: Never    Passive exposure: Past   Smokeless tobacco: Never  Vaping Use   Vaping status: Never Used  Substance Use Topics   Alcohol use: Not Currently    Comment: occasionally   Drug use: Never     Family Hx: The patient's family history includes Asthma in her sister; Diabetes in her paternal grandfather; Heart attack in her father; Heart disease in her father and paternal grandfather; Hypertension in her paternal grandfather; Lung cancer in her father; Ovarian cancer in her maternal aunt; Throat cancer in her father. There is no history of Breast cancer or Colon cancer.  ROS:   Please see the history of present illness.     All other systems reviewed and are negative.   Prior CV studies:   The following studies were reviewed today:   Labs/Other Tests and Data Reviewed:    EKG Interpretation Date/Time:  Monday March 10 2024 10:22:50 EDT Ventricular Rate:  80 PR Interval:  140 QRS Duration:  74 QT Interval:  408 QTC Calculation: 470 R Axis:   40  Text Interpretation: Normal sinus rhythm Possible Left atrial enlargement ST & T wave abnormality, consider lateral ischemia Prolonged QT When compared with ECG of 22-Mar-2021 21:10, PREVIOUS ECG IS PRESENT Confirmed by Debra Ware [phone]) on 03/10/2024 10:36:49 AM EKG Interpretation Date/Time:  Monday March 10 2024 10:22:50 EDT Ventricular Rate:   80 PR Interval:  140 QRS Duration:  74 QT Interval:  408 QTC Calculation: 470 R Axis:   40  Text Interpretation: Normal sinus rhythm Possible Left atrial enlargement ST & T wave abnormality, consider lateral ischemia Prolonged QT When compared with ECG of 22-Mar-2021 21:10, PREVIOUS ECG IS PRESENT Confirmed by Debra Ware ([id number]) on 03/10/2024 10:36:49 AM    Recent Labs: No results found for requested labs within last 365 days.   Recent Lipid Panel Lab Results  Component Value Date/Time   CHOL 137 08/16/2019 12:47 PM   TRIG 72 08/16/2019 12:47 PM   HDL 38 (L) 08/16/2019 12:47 PM   CHOLHDL 3.6 08/16/2019 12:47 PM   LDLCALC 85 08/16/2019 12:47 PM  Wt Readings from Last 3 Encounters:  03/10/24 (!) 380 lb 6.4 oz (172.5 kg)  06/06/23 (!) 338 lb 9.6 oz (153.6 kg)  01/16/23 (!) 351 lb (159.2 kg)     Risk Assessment/Calculations:    CHA2DS2-VASc Score =   3 (female, HTN, CHF) This indicates a  % annual risk of stroke. The patient's score is based upon:     Objective:    Vital Signs:  BP 132/88   Pulse 80   Ht 5\' 10"  (1.778 m)   Wt (!) 380 lb 6.4 oz (172.5 kg)   SpO2 92%   BMI 54.58 kg/m   GEN: Well nourished, well developed in no acute distress HEENT: Normal NECK: No JVD; No carotid bruits LYMPHATICS: No lymphadenopathy CARDIAC:RRR, no murmurs, rubs, gallops RESPIRATORY:  Clear to auscultation without rales, wheezing or rhonchi  ABDOMEN: Soft, non-tender, non-distended MUSCULOSKELETAL:  No edema; No deformity  SKIN: Warm and dry NEUROLOGIC:  Alert and oriented x 3 PSYCHIATRIC:  Normal affect  ASSESSMENT & PLAN:    1.  PAF -She remains in normal sinus rhythm today and denies any palpitations -CHADS2VASC score is 2 (female and HTN)  -She denies any bleeding problems on DOAC -Continue prescription drug management with Eliquis 5 mg twice daily and Toprol XL 50 mg daily with as needed refills -I will check a CBC and a BMP today  2. HTN -BP is adequately  controlled on exam today -Continue prescription drug management with Toprol-XL 50 mg daily with as needed refills  3.  Chronic diastolic CHF -felt to be multifactorial from obesity, sedentary lifestyle, lung mets from her cervical CA and extensive adenopathy in the abdomen, asthma/COPD and diastolic CHF  -she has a hx of pericardial effusion that resolved on echo in May 2021 -echo 12/22 with EF 55-60% with G2DD -She appears euvolemic on exam today and has not required any diuretics recently although she has had some LE edema recently -Continue prescription drug management with Toprol-XL 50 mg daily -I will give her a Rx for Lasix 20mg  and told her to take 2 tablets daily for 2 days and then go to 1 tablet daily -check BMET in 1 week  4.  Snoring -she has been told that she snores loudly and has problems feeling tired during the day -Stop Bang score 5 -PSG was ordered but never followed through -she continues to feel sleepy during the day and sleeps poorly at night but largely because she has poor sleep hygiene and sometimes will sleep all day and not sleep at night. -I will get an Itamar HST to assess for SOA        Time:   Today, I have spent 20 minutes with the patient with telehealth technology discussing the above problems.     Medication Adjustments/Labs and Tests Ordered: Current medicines are reviewed at length with the patient today.  Concerns regarding medicines are outlined above.   Tests Ordered: Orders Placed This Encounter  Procedures   EKG 12-Lead    Medication Changes: No orders of the defined types were placed in this encounter.   Follow Up:  In Person in 1 year(s)  Signed, Debra Magic, MD  03/10/2024 10:49 AM    [facility]

## 2024-03-10 NOTE — Telephone Encounter (Signed)
 [name] sent me secure chat Dr. Mayford Knife ordered Itamar study for the pt. I was not available at the moment and the pt left. I tried to call the pt but her vm is full and I could not leave a message to call back.   I have updated the RN as well vm full, could not leave message.

## 2024-03-10 NOTE — Telephone Encounter (Signed)
 DPR ok to s/w the pt's sister. I tried x's 2 to reach pt to set up Itamar study, vm full. I s/w the pt's sister and and she will text the pt to call the office and ask to s/w [name] for the sleep study.

## 2024-03-10 NOTE — Telephone Encounter (Signed)
 Schlanger, [name], RN  P Cv Div Sleep Studies; [name], CMA; [name], CMA; [name], [name] An itamar has been ordered for this pt. The stop bang score was 5. She will need the device and code.

## 2024-03-10 NOTE — Patient Instructions (Signed)
 Medication Instructions:  Start Lasix 20 mg **Take 2 tablets daily (40 mg) for 2 days. Then take as needed for lower leg edema *If you need a refill on your cardiac medications before your next appointment, please call your pharmacy*  Lab Work: CBC and BMET in 1 week If you have labs (blood work) drawn today and your tests are completely normal, you will receive your results only by: MyChart Message (if you have MyChart) OR A paper copy in the mail If you have any lab test that is abnormal or we need to change your treatment, we will call you to review the results.  Testing/Procedures: Itamar sleep study. We will reach out to you to make an appointment.  Follow-Up: At [facility], you and your health needs are our priority.  As part of our continuing mission to provide you with exceptional heart care, our providers are all part of one team.  This team includes your primary Cardiologist (physician) and Advanced Practice Providers or APPs (Physician Assistants and Nurse Practitioners) who all work together to provide you with the care you need, when you need it.  Your next appointment:   6 month(s)  Provider:   Armanda Magic, MD     We recommend signing up for the patient portal called "MyChart".  Sign up information is provided on this After Visit Summary.  MyChart is used to connect with patients for Virtual Visits (Telemedicine).  Patients are able to view lab/test results, encounter notes, upcoming appointments, etc.  Non-urgent messages can be sent to your provider as well.   To learn more about what you can do with MyChart, go to [email or web address].   Other Instructions       1st Floor: - Lobby - Registration  - Pharmacy  - Lab - Cafe  2nd Floor: - PV Lab - Diagnostic Testing (echo, CT, nuclear med)  3rd Floor: - Vacant  4th Floor: - TCTS (cardiothoracic surgery) - AFib Clinic - Structural Heart Clinic - Vascular Surgery  - Vascular  Ultrasound  5th Floor: - HeartCare Cardiology (general and EP) - Clinical Pharmacy for coumadin, hypertension, lipid, weight-loss medications, and med management appointments    Valet parking services will be available as well.

## 2024-03-10 NOTE — Telephone Encounter (Signed)
Tried to call the pt back but no answer

## 2024-03-10 NOTE — Telephone Encounter (Signed)
 Patient returned Carol's call regarding [name] Sleep study.

## 2024-03-10 NOTE — Addendum Note (Signed)
 Addended by: Erick Alley on: 03/10/2024 11:14 AM   Modules accepted: Orders

## 2024-03-11 NOTE — Telephone Encounter (Signed)
 I tried to call the pt again to set up sleep study, though vm is full.       Via, Lorelle Formosa, LPN  You; Luellen Pucker, RN; Cv Div Ch St Triage4 minutes ago (9:44 AM)    [name], I did not realize until after I did this PA that the pt did not have a device. Ill be happy to provide the Pin # to the pt if you let me know when she gets a device. Covering for Dr Mayford Knife, Just FYI: Instructions for covering staff: 1. Please contact patient in 2 weeks if WatchPAT study results are not available yet. Remind patient to complete test. 2. If patient declines to proceed with test, please confirm that box is unopened and remind patient to return it to the office within 30 days. Route phone note to CV DIV SLEEP STUDIES pool for tracking. 3. If box has been opened, please route phone note to CV DIV SLEEP STUDIES pool to have device de-initialized and processed for billing.   Thanks, Woody Seller, LPN4 minutes ago (9:44 AM)    Ordering provider: Dr Mayford Knife Associated diagnoses: Snoring-R06.83, CHF-I50.32, A-Fib-I48.0 and HTN-I10   WatchPAT PA obtained on 03/11/2024 by Via, Lorelle Formosa, LPN. Authorization: Per the [employer]: CPT code: 96045 If you're a participating provider, no precertification is required when this service is performed as an outpatient procedure for a medical or surgical diagnosis.    Patient NOT notified of PIN (1234) on 03/11/2024 because the pt does not have a WatchPAT One-HST Device yet.   Phone note routed to covering staff for follow-up.        Note   You17 hours ago (4:30 PM)    Tried to call the pt back but no answer.       Note   You  Debra Ware, Debra Ware [phone] hours ago (4:30 PM)   [name] routed conversation to You17 hours ago (4:17 PM)   [name], [name]17 hours ago (4:17 PM)   JW Patient returned [name]'s call regarding [name] Sleep study.      Note   Debra Ware, Debra Ware [phone]  [name]17 hours ago (4:15 PM)   You17  hours ago (3:49 PM)    DPR ok to s/w the pt's sister. I tried x's 2 to reach pt to set up Itamar study, vm full. I s/w the pt's sister and and she will text the pt to call the office and ask to s/w [name] for the sleep study.       Note   You  Debra Ware,Debra Ware [phone] hours ago (3:48 PM)   You22 hours ago (11:21 AM)    Health visitor, [name], RN  P Cv Div Sleep Studies; [name], CMA; [name], CMA; [name], [name] An itamar has been ordered for this pt. The stop bang score was 5. She will need the device and code.      Note   You routed conversation to You22 hours ago (11:20 AM)   [name] hours ago (11:20 AM)    [name] sent me secure chat Dr. Mayford Knife ordered Itamar study for the pt. I was not available at the moment and the pt left. I tried to call the pt but her vm is full and I could not leave a message to call back.    I have updated the RN as well vm full, could not leave message.       Note  [name], [name] [phone]

## 2024-03-11 NOTE — Telephone Encounter (Signed)
**Note De-Identified Vanshika Jastrzebski Obfuscation** Ordering provider: Dr Mayford Knife Associated diagnoses: Snoring-R06.83, CHF-I50.32, A-Fib-I48.0 and HTN-I10  WatchPAT PA obtained on 03/11/2024 by Abisai Coble, Lorelle Formosa, LPN. Authorization: Per the [employer]: CPT code: 40981 If you're a participating provider, no precertification is required when this service is performed as an outpatient procedure for a medical or surgical diagnosis.   Patient NOT notified of PIN (1234) on 03/11/2024 because the pt does not have a WatchPAT One-HST Device yet.  Phone note routed to covering staff for follow-up.

## 2024-03-13 DIAGNOSIS — E039 Hypothyroidism, unspecified: Secondary | ICD-10-CM | POA: Diagnosis not present

## 2024-03-18 NOTE — Telephone Encounter (Signed)
 Patient returned Carol's call regarding scheduling sleep study.

## 2024-03-20 ENCOUNTER — Telehealth: Payer: Self-pay | Admitting: *Deleted

## 2024-03-20 NOTE — Telephone Encounter (Signed)
 ----- Message from [facility] Debra Ware F sent at 03/19/2024  8:23 AM EDT ----- Regarding: NEEDS Debra Ware SET UP Debra Ware, CMA  Debra Ware, Debra N19 hours ago (1:21 PM)   Patient returned Debra Ware's call regarding scheduling sleep study.  Debra Ware, CMA  You19 hours ago (1:21 PM)   Patient returned Debra Ware's call regarding scheduling sleep study.  Wilson, Jasmin Ware routed conversation to [employer]; Debra Ware, CMA20 hours ago (11:30 AM)  [name], [name]20 hours ago (11:30 AM)  JW Patient returned Debra Ware's call regarding scheduling sleep study.   Note  Debra Ware, Debra Ware [phone]  Debra Squibb B20 hours ago (11:29 AM)  Loa Socks, LPN routed conversation to Luellen Pucker, RN5 days ago  You8 days ago   I tried to call the pt again to set up sleep study, though vm is full.             Via, Lorelle Formosa, LPN  You; Luellen Pucker, RN; Cv Div Ch St Triage4 minutes ago (9:44 AM)      Debra Ware, I did not realize until after I did this PA that the pt did not have a device. Ill be happy to provide the Pin # to the pt if you let me know when she gets a device. Covering for Dr Mayford Knife, Just FYI: Instructions for covering staff: 1. Please contact patient in 2 weeks if WatchPAT study results are not available yet. Remind patient to complete test. 2. If patient declines to proceed with test, please confirm that box is unopened and remind patient to return it to the office within 30 days. Route phone note to CV DIV SLEEP STUDIES pool for tracking. 3. If box has been opened, please route phone note to CV DIV SLEEP STUDIES pool to have device de-initialized and processed for billing.   Thanks, Debra Ware, LPN4 minutes ago (9:44 AM)      1. Ordering provider: Dr Mayford Knife 2. Associated diagnoses: Snoring-R06.83, CHF-I50.32, A-Fib-I48.0 and HTN-I10   3. WatchPAT PA obtained on 03/11/2024 by Via, Lorelle Formosa, LPN. Authorization: Per the [employer]: CPT code: 09811 If you're a participating provider, no precertification is required when this service is performed as an outpatient procedure for a medical or surgical diagnosis.    4. Patient NOT notified of PIN (1234) on 03/11/2024 because the pt does not have a WatchPAT One-HST Device yet.   Phone note routed to covering staff for follow-up.      Note    You17 hours ago (4:30 PM)      Tried to call the pt back but no answer.     Note    You  Debra Ware, Debra Ware [phone] hours ago (4:30 PM)    Debra Ware routed conversation to You17 hours ago (4:17 PM)    [name], [name]17 hours ago (4:17 PM)   JW  Patient returned Debra Ware's call regarding [name] Sleep study.    Note    Debra Ware, Debra Ware [phone]  Debra Squibb B17 hours ago (4:15 PM)    You17 hours ago (3:49 PM)      DPR ok to s/w the pt's sister. I tried x's 2 to reach pt to set up Debra Ware study, vm full. I s/w the pt's sister and and she will text the pt to call the office and ask to s/w Debra Ware for the sleep study.     Note    You  Debra Ware,Debra Ware [phone] hours  ago (3:48 PM)    You22 hours ago (11:21 AM)      Debra Ware, Debra Rob, RN  P Cv Div Sleep Studies; Debra Ware, CMA; Debra Ware, CMA; Debra Ware, Debra Ware An Debra Ware has been ordered for this pt. The stop bang score was 5. She will need the device and code.    Note    You routed conversation to You22 hours ago (11:20 AM)    Debra Ware hours ago (11:20 AM)      Lovette Rud RN sent me secure chat Dr. Micael Adas ordered Debra Ware study for the pt. I was not available at the moment and the pt left. I tried to call the pt but her vm is full and I could not leave a message to call back.    I have updated the RN as well vm full, could not leave message.     Note    Aleathea, Pugmire [phone]      Note  You  Jazarah, Capili [phone] days ago  Via, Isabella Mao, LPN  You; Cherylyn Cos, RN; Cv Div Ch  [address] [place] days ago   Sheryle Donning, I did not realize until after I did this PA that the pt did not have a device. Ill be happy to provide the Pin # to the pt if you let me know when she gets a device. Covering for Dr Micael Adas, Just FYI: Instructions for covering staff: 1. Please contact patient in 2 weeks if WatchPAT study results are not available yet. Remind patient to complete test. 2. If patient declines to proceed with test, please confirm that box is unopened and remind patient to return it to the office within 30 days. Route phone note to CV DIV SLEEP STUDIES pool for tracking. 3. If box has been opened, please route phone note to CV DIV SLEEP STUDIES pool to have device de-initialized and processed for billing.   Thanks, Lauree Pomfret, LPN8 days ago   1. Ordering provider: Dr Micael Adas 2. Associated diagnoses: Snoring-R06.83, CHF-I50.32, A-Fib-I48.0 and HTN-I10   3. WatchPAT PA obtained on 03/11/2024 by Via, Isabella Mao, LPN. Authorization: Per the [employer]: CPT code: 57846 If you're a participating provider, no precertification is required when this service is performed as an outpatient procedure for a medical or surgical diagnosis.    4. Patient NOT notified of PIN (1234) on 03/11/2024 because the pt does not have a WatchPAT One-HST Device yet.   Phone note routed to covering staff for follow-up.     Note  You9 days ago   Tried to call the pt back but no answer.    Note  You  Corianna, [place]allone [phone] days ago  Sherryle Don Ware routed conversation to [employer] days ago  Wilson, [name]9 days ago  JW Patient returned Rayden Dock's call regarding [name] Sleep study.   Note  Rosalba, Totty [phone]  Sherryle Don B9 days ago  You9 days ago   DPR ok to s/w the pt's sister. I tried x's 2 to reach pt to set up Debra Ware study, vm full. I s/w the pt's sister and and she will text the pt to call the office and ask to s/w Sheryle Donning for the sleep study.    Note  You   Villafranca,Debra Ware [phone] days ago  You9 days ago   Debra Ware, Debra Rob, RN  P Cv Div Sleep Studies; Debra Ware, CMA; Icker Swigert Ware, CMA; Debra Ware, Debra Ware An Debra Ware has been ordered  for this pt. The stop bang score was 5. She will need the device and code.   Note  You routed conversation to You9 days ago  You9 days ago   [place] S. RN sent me secure chat Dr. Micael Adas ordered Shawnie Delton study for the pt. I was not available at the moment and the pt left. I tried to call the pt but her vm is full and I could not leave a message to call back.    I have updated the RN as well vm full, could not leave message.    Note  Serrita Lueth, Haylei [phone]

## 2024-03-20 NOTE — Telephone Encounter (Signed)
 I s/w the pt today and she will come be the office 03/24/24 @ 11 am to set up sleep study. Pt aware that either Horton Maas or [facility] will help set up the study. Pt is approved and will be given the PIN# at time of set up.   I will send this message to Horton Maas and [facility].

## 2024-03-20 NOTE — Telephone Encounter (Addendum)
 I s/w the pt today and she will come be the office 03/24/24 @ 11 am to set up sleep study. Pt aware that either Ophelia Charter or Victorino Dike will help set up the study. Pt is approved and will be given the PIN# at time of set up.   I will send this message to Ophelia Charter and Victorino Dike.    ----- Message from [facility] [name] F sent at 03/19/2024  8:23 AM EDT ----- Regarding: NEEDS ITAMAR SET UP [name], CMA  [name], [name]19 hours ago (1:21 PM)   Patient returned Zan Orlick's call regarding scheduling sleep study.  [name], CMA  You19 hours ago (1:21 PM)   Patient returned Revel Stellmach's call regarding scheduling sleep study.  Wilson, [name] routed conversation to [employer] Studies; [name], CMA20 hours ago (11:30 AM)  [name], [name]20 hours ago (11:30 AM)  JW Patient returned Dquan Cortopassi's call regarding scheduling sleep study.   Note  Maicy, Filip [phone]  [name]20 hours ago (11:29 AM)  Loa Socks, LPN routed conversation to Luellen Pucker, RN5 days ago  You8 days ago   I tried to call the pt again to set up sleep study, though vm is full.             Via, Lorelle Formosa, LPN  You; Luellen Pucker, RN; Cv Div Ch St Triage4 minutes ago (9:44 AM)      [name], I did not realize until after I did this PA that the pt did not have a device. Ill be happy to provide the Pin # to the pt if you let me know when she gets a device. Covering for Dr Mayford Knife, Just FYI: Instructions for covering staff: 1. Please contact patient in 2 weeks if WatchPAT study results are not available yet. Remind patient to complete test. 2. If patient declines to proceed with test, please confirm that box is unopened and remind patient to return it to the office within 30 days. Route phone note to CV DIV SLEEP STUDIES pool for tracking. 3. If box has been opened, please route phone note to CV DIV SLEEP STUDIES pool to have device de-initialized and processed for billing.    Thanks, Woody Seller, LPN4 minutes ago (9:44 AM)      1. Ordering provider: Dr Mayford Knife 2. Associated diagnoses: Snoring-R06.83, CHF-I50.32, A-Fib-I48.0 and HTN-I10   3. WatchPAT PA obtained on 03/11/2024 by Via, Lorelle Formosa, LPN. Authorization: Per the [employer]: CPT code: 09811 If you're a participating provider, no precertification is required when this service is performed as an outpatient procedure for a medical or surgical diagnosis.    4. Patient NOT notified of PIN (1234) on 03/11/2024 because the pt does not have a WatchPAT One-HST Device yet.   Phone note routed to covering staff for follow-up.      Note    You17 hours ago (4:30 PM)      Tried to call the pt back but no answer.     Note    You  Jonnie, Kubly [phone] hours ago (4:30 PM)    [name] routed conversation to You17 hours ago (4:17 PM)    [name], [name]17 hours ago (4:17 PM)   JW  Patient returned Gwenda Heiner's call regarding [name] Sleep study.    Note    Thelia, [place] [phone]  [name]17 hours ago (4:15 PM)    You17 hours ago (3:49 PM)  DPR ok to s/w the pt's sister. I tried x's 2 to reach pt to set up Itamar study, vm full. I s/w the pt's sister and and she will text the pt to call the office and ask to s/w Sheryle Donning for the sleep study.     Note    You  Lawrance,TOSCA [phone] hours ago (3:48 PM)    You22 hours ago (11:21 AM)      Health visitor, Lonnell Rob, RN  P Cv Div Sleep Studies; Joslyn Nim, CMA; Dawanda Mapel M, CMA; [name], [name] An itamar has been ordered for this pt. The stop bang score was 5. She will need the device and code.    Note    You routed conversation to You22 hours ago (11:20 AM)    Juanna Norman hours ago (11:20 AM)      Lovette Rud RN sent me secure chat Dr. Micael Adas ordered Itamar study for the pt. I was not available at the moment and the pt left. I tried to call the pt but her vm is full and I  could not leave a message to call back.    I have updated the RN as well vm full, could not leave message.     Note    Jeneva, Schweizer [phone]      Note  You  Caelen, Higinbotham [phone] days ago  Via, Isabella Mao, LPN  You; Cherylyn Cos, RN; Cv Div Ch [address] [place] days ago   Sheryle Donning, I did not realize until after I did this PA that the pt did not have a device. Ill be happy to provide the Pin # to the pt if you let me know when she gets a device. Covering for Dr Micael Adas, Just FYI: Instructions for covering staff: 1. Please contact patient in 2 weeks if WatchPAT study results are not available yet. Remind patient to complete test. 2. If patient declines to proceed with test, please confirm that box is unopened and remind patient to return it to the office within 30 days. Route phone note to CV DIV SLEEP STUDIES pool for tracking. 3. If box has been opened, please route phone note to CV DIV SLEEP STUDIES pool to have device de-initialized and processed for billing.   Thanks, Lauree Pomfret, LPN8 days ago   1. Ordering provider: Dr Micael Adas 2. Associated diagnoses: Snoring-R06.83, CHF-I50.32, A-Fib-I48.0 and HTN-I10   3. WatchPAT PA obtained on 03/11/2024 by Via, Isabella Mao, LPN. Authorization: Per the [employer]: CPT code: 46962 If you're a participating provider, no precertification is required when this service is performed as an outpatient procedure for a medical or surgical diagnosis.    4. Patient NOT notified of PIN (1234) on 03/11/2024 because the pt does not have a WatchPAT One-HST Device yet.   Phone note routed to covering staff for follow-up.     Note  You9 days ago   Tried to call the pt back but no answer.    Note  You  Maretta, [place]dorf [phone] days ago  Sherryle Don B routed conversation to [employer] days ago  Wilson, [name]9 days ago  JW Patient returned Ysabel Cowgill's call regarding [name] Sleep study.   Note  Marilla, Boddy  [phone]  Sherryle Don B9 days ago  You9 days ago   DPR ok to s/w the pt's sister. I tried x's 2 to reach pt to set up Itamar study, vm full. I s/w the pt's sister and and she will  text the pt to call the office and ask to s/w Sheryle Donning for the sleep study.    Note  You  Penninger,TOSCA [phone] days ago  You9 days ago   Health visitor, Lonnell Rob, RN  P Cv Div Sleep Studies; Joslyn Nim, CMA; Wilson Sample M, CMA; [name], [name] An itamar has been ordered for this pt. The stop bang score was 5. She will need the device and code.   Note  You routed conversation to You9 days ago  You9 days ago   [place] S. RN sent me secure chat Dr. Micael Adas ordered Shawnie Delton study for the pt. I was not available at the moment and the pt left. I tried to call the pt but her vm is full and I could not leave a message to call back.    I have updated the RN as well vm full, could not leave message.    Note  Geovana Gebel, Kenniyah [phone]

## 2024-03-24 NOTE — Telephone Encounter (Signed)
 Patient agreement reviewed and signed on 03/24/2024.  WatchPAT issued to patient on 03/24/2024 by Yaeko Fazekas Chauvigne, RN. Patient aware to not open the WatchPAT box until contacted with the activation PIN. Patient profile initialized in Cloud[employer] on 03/24/2024 by Carly Chauvigne, RN. Device serial number: [id number]  Please list Reason for Call as Advice Only and type "WatchPAT issued to patient" in the comment box.

## 2024-03-25 DIAGNOSIS — Z8679 Personal history of other diseases of the circulatory system: Secondary | ICD-10-CM | POA: Diagnosis not present

## 2024-03-25 DIAGNOSIS — E041 Nontoxic single thyroid nodule: Secondary | ICD-10-CM | POA: Diagnosis not present

## 2024-03-25 DIAGNOSIS — I10 Essential (primary) hypertension: Secondary | ICD-10-CM | POA: Diagnosis not present

## 2024-03-25 DIAGNOSIS — E039 Hypothyroidism, unspecified: Secondary | ICD-10-CM | POA: Diagnosis not present

## 2024-03-25 DIAGNOSIS — R7301 Impaired fasting glucose: Secondary | ICD-10-CM | POA: Diagnosis not present

## 2024-03-30 ENCOUNTER — Encounter (INDEPENDENT_AMBULATORY_CARE_PROVIDER_SITE_OTHER): Payer: Self-pay | Admitting: Cardiology

## 2024-03-30 DIAGNOSIS — G4733 Obstructive sleep apnea (adult) (pediatric): Secondary | ICD-10-CM

## 2024-04-01 ENCOUNTER — Ambulatory Visit: Attending: Cardiology

## 2024-04-01 DIAGNOSIS — R0683 Snoring: Secondary | ICD-10-CM

## 2024-04-01 NOTE — Procedures (Signed)
   SLEEP STUDY REPORT Patient Information Study Date: 03/30/2024 Patient Name: Debra Ware Patient ID: [id number] Birth Date: 07/14/1966 Age: 58 Gender: Female BMI: 54.6 (W=381 lb, H=5' 10'') Referring Physician: Gaylyn Keas, MD  TEST DESCRIPTION: Home sleep apnea testing was completed using the WatchPat, a Type 1 device, utilizing peripheral arterial tonometry (PAT), chest movement, actigraphy, pulse oximetry, pulse rate, body position and snore. AHI was calculated with apnea and hypopnea using valid sleep time as the denominator. RDI includes apneas, hypopneas, and RERAs. The data acquired and the scoring of sleep and all associated events were performed in accordance with the recommended standards and specifications as outlined in the AASM Manual for the Scoring of Sleep and Associated Events 2.2.0 (2015).  FINDINGS:  1. Mild Obstructive Sleep Apnea with AHI 10.3/hr overall and severe during REM sleep with REM AHI 31.4/hr.  2. No Central Sleep Apnea with pAHIc 0.4/hr.  3. Oxygen  desaturations as low as 81%.  4. Moderate to severe snoring was present. O2 sats were < 88% for 9.8 min.  5. Total sleep time was 5 hrs and 25 min.  6. 17.4% of total sleep time was spent in REM sleep.  7. Normal sleep onset latency at 14 min.  8. Prolonged REM sleep onset latency at 109 min.  9. Total awakenings were 6. 10. Arrhythmia detection: None  DIAGNOSIS: Mild to moderate Obstructive Sleep Apnea (G47.33) Nocturnal Hypoxemia  RECOMMENDATIONS: 1. Clinical correlation of these findings is necessary. The decision to treat obstructive sleep apnea (OSA) is usually based on the presence of apnea symptoms or the presence of associated medical conditions such as Hypertension, Congestive Heart Failure, Atrial Fibrillation or Obesity. The most common symptoms of OSA are snoring, gasping for breath while sleeping, daytime sleepiness and fatigue. 2. Initiating apnea therapy is recommended given the  presence of symptoms and/or associated conditions. Recommend proceeding with one of the following:  a. Auto-CPAP therapy with a pressure range of 5-20cm H2O.  b. An oral appliance (OA) that can be obtained from certain dentists with expertise in sleep medicine. These are primarily of use in non-obese patients with mild and moderate disease.  c. An ENT consultation which may be useful to look for specific causes of obstruction and possible treatment options.  d. If patient is intolerant to PAP therapy, consider referral to ENT for evaluation for hypoglossal nerve stimulator. 3. Close follow-up is necessary to ensure success with CPAP or oral appliance therapy for maximum benefit . 4. A follow-up oximetry study on CPAP is recommended to assess the adequacy of therapy and determine the need for supplemental oxygen  or the potential need for Bi-level therapy. An arterial blood gas to determine the adequacy of baseline ventilation and oxygenation should also be considered. 5. Healthy sleep recommendations include: adequate nightly sleep (normal 7-9 hrs/night), avoidance of caffeine after noon and alcohol near bedtime, and maintaining a sleep environment that is cool, dark and quiet. 6. Weight loss for overweight patients is recommended. Even modest amounts of weight loss can significantly improve the severity of sleep apnea. 7. Snoring recommendations include: weight loss where appropriate, side sleeping, and avoidance of alcohol before bed. 8. Operation of motor vehicle should be avoided when sleepy.  Signature: Gaylyn Keas, MD; [facility]; Diplomat, American Board of Sleep Medicine Electronically Signed: 04/01/2024 8:32:34 PM

## 2024-04-02 ENCOUNTER — Other Ambulatory Visit: Payer: Self-pay | Admitting: Cardiology

## 2024-04-02 ENCOUNTER — Telehealth: Payer: Self-pay

## 2024-04-02 DIAGNOSIS — R0683 Snoring: Secondary | ICD-10-CM

## 2024-04-02 DIAGNOSIS — I480 Paroxysmal atrial fibrillation: Secondary | ICD-10-CM

## 2024-04-02 DIAGNOSIS — D6869 Other thrombophilia: Secondary | ICD-10-CM

## 2024-04-02 DIAGNOSIS — I5032 Chronic diastolic (congestive) heart failure: Secondary | ICD-10-CM

## 2024-04-02 DIAGNOSIS — I10 Essential (primary) hypertension: Secondary | ICD-10-CM

## 2024-04-02 DIAGNOSIS — G4733 Obstructive sleep apnea (adult) (pediatric): Secondary | ICD-10-CM

## 2024-04-02 NOTE — Telephone Encounter (Signed)
-----   Message from Gaylyn Keas sent at 04/01/2024  8:35 PM EDT ----- Please let patient know that they have sleep apnea.  Recommend therapeutic CPAP titration for treatment of patient's sleep disordered breathing.

## 2024-04-02 NOTE — Telephone Encounter (Signed)
 Notified patient of sleep study results and recommendations. All questions were answered and patient verbalized understanding. CPAP Titration ordered today.

## 2024-04-11 ENCOUNTER — Ambulatory Visit: Payer: Medicare HMO | Admitting: Podiatry

## 2024-04-15 ENCOUNTER — Ambulatory Visit: Admitting: Podiatry

## 2024-05-05 ENCOUNTER — Ambulatory Visit: Admitting: Podiatry

## 2024-07-08 DIAGNOSIS — E039 Hypothyroidism, unspecified: Secondary | ICD-10-CM | POA: Diagnosis not present

## 2024-07-08 DIAGNOSIS — E1165 Type 2 diabetes mellitus with hyperglycemia: Secondary | ICD-10-CM | POA: Diagnosis not present

## 2024-07-08 DIAGNOSIS — E041 Nontoxic single thyroid nodule: Secondary | ICD-10-CM | POA: Diagnosis not present

## 2024-08-26 DIAGNOSIS — Z0000 Encounter for general adult medical examination without abnormal findings: Secondary | ICD-10-CM | POA: Diagnosis not present

## 2024-08-26 DIAGNOSIS — E1165 Type 2 diabetes mellitus with hyperglycemia: Secondary | ICD-10-CM | POA: Diagnosis not present

## 2024-08-26 DIAGNOSIS — E7800 Pure hypercholesterolemia, unspecified: Secondary | ICD-10-CM | POA: Diagnosis not present

## 2024-08-26 DIAGNOSIS — R7301 Impaired fasting glucose: Secondary | ICD-10-CM | POA: Diagnosis not present

## 2024-09-02 DIAGNOSIS — J4530 Mild persistent asthma, uncomplicated: Secondary | ICD-10-CM | POA: Diagnosis not present

## 2024-09-02 DIAGNOSIS — Z8679 Personal history of other diseases of the circulatory system: Secondary | ICD-10-CM | POA: Diagnosis not present

## 2024-09-02 DIAGNOSIS — Z23 Encounter for immunization: Secondary | ICD-10-CM | POA: Diagnosis not present

## 2024-09-02 DIAGNOSIS — E7800 Pure hypercholesterolemia, unspecified: Secondary | ICD-10-CM | POA: Diagnosis not present

## 2024-09-02 DIAGNOSIS — R911 Solitary pulmonary nodule: Secondary | ICD-10-CM | POA: Diagnosis not present

## 2024-09-02 DIAGNOSIS — E1165 Type 2 diabetes mellitus with hyperglycemia: Secondary | ICD-10-CM | POA: Diagnosis not present

## 2024-09-02 DIAGNOSIS — M545 Low back pain, unspecified: Secondary | ICD-10-CM | POA: Diagnosis not present

## 2024-09-02 DIAGNOSIS — E041 Nontoxic single thyroid nodule: Secondary | ICD-10-CM | POA: Diagnosis not present

## 2024-09-02 DIAGNOSIS — Z8541 Personal history of malignant neoplasm of cervix uteri: Secondary | ICD-10-CM | POA: Diagnosis not present

## 2024-09-02 DIAGNOSIS — Z0000 Encounter for general adult medical examination without abnormal findings: Secondary | ICD-10-CM | POA: Diagnosis not present

## 2024-09-02 DIAGNOSIS — E039 Hypothyroidism, unspecified: Secondary | ICD-10-CM | POA: Diagnosis not present

## 2024-09-03 ENCOUNTER — Other Ambulatory Visit: Payer: Self-pay

## 2024-09-03 ENCOUNTER — Inpatient Hospital Stay
Admission: RE | Admit: 2024-09-03 | Discharge: 2024-09-03 | Disposition: A | Discharge status: Home / Self Care | Payer: Self-pay | Source: Ambulatory Visit | Attending: Gynecologic Oncology | Admitting: Gynecologic Oncology

## 2024-09-03 ENCOUNTER — Inpatient Hospital Stay
Admission: RE | Admit: 2024-09-03 | Discharge: 2024-09-03 | Disposition: A | Payer: Self-pay | Source: Ambulatory Visit | Attending: Gynecologic Oncology | Admitting: Gynecologic Oncology

## 2024-09-03 ENCOUNTER — Other Ambulatory Visit: Payer: Self-pay | Admitting: Gynecologic Oncology

## 2024-09-03 DIAGNOSIS — C539 Malignant neoplasm of cervix uteri, unspecified: Secondary | ICD-10-CM

## 2024-09-04 ENCOUNTER — Telehealth: Payer: Self-pay | Admitting: *Deleted

## 2024-09-04 NOTE — Telephone Encounter (Signed)
 Spoke with the patient regarding the referral to GYN oncology. Patient scheduled as new patient with Dr Eldonna on 10/27 at 11:15 am. Patient given an arrival time of 10:45 am.   Explained to the patient the the doctor will perform a pelvic exam at this visit. Patient given the policy that only one visitor allowed and that visitor must be over 16 yrs are allowed in the Cancer Center. Patient given the address/phone number for the clinic and that the center offers free valet service. Patient aware that masks optional.

## 2024-09-25 ENCOUNTER — Encounter: Payer: Self-pay | Admitting: Psychiatry

## 2024-09-29 ENCOUNTER — Inpatient Hospital Stay: Attending: Psychiatry | Admitting: Psychiatry

## 2024-09-29 ENCOUNTER — Encounter: Payer: Self-pay | Admitting: Psychiatry

## 2024-09-29 ENCOUNTER — Other Ambulatory Visit (HOSPITAL_COMMUNITY)
Admission: RE | Admit: 2024-09-29 | Discharge: 2024-09-29 | Disposition: A | Discharge status: Home / Self Care | Source: Ambulatory Visit | Attending: Psychiatry | Admitting: Psychiatry

## 2024-09-29 ENCOUNTER — Encounter: Payer: Self-pay | Admitting: Oncology

## 2024-09-29 VITALS — BP 136/76 | HR 74 | Temp 98.9°F | Resp 20 | Ht 70.0 in | Wt 346.4 lb

## 2024-09-29 DIAGNOSIS — Z90722 Acquired absence of ovaries, bilateral: Secondary | ICD-10-CM | POA: Diagnosis not present

## 2024-09-29 DIAGNOSIS — Z79899 Other long term (current) drug therapy: Secondary | ICD-10-CM | POA: Diagnosis not present

## 2024-09-29 DIAGNOSIS — I480 Paroxysmal atrial fibrillation: Secondary | ICD-10-CM | POA: Insufficient documentation

## 2024-09-29 DIAGNOSIS — I5032 Chronic diastolic (congestive) heart failure: Secondary | ICD-10-CM | POA: Insufficient documentation

## 2024-09-29 DIAGNOSIS — Z808 Family history of malignant neoplasm of other organs or systems: Secondary | ICD-10-CM | POA: Diagnosis not present

## 2024-09-29 DIAGNOSIS — Z90710 Acquired absence of both cervix and uterus: Secondary | ICD-10-CM | POA: Insufficient documentation

## 2024-09-29 DIAGNOSIS — Z1151 Encounter for screening for human papillomavirus (HPV): Secondary | ICD-10-CM | POA: Insufficient documentation

## 2024-09-29 DIAGNOSIS — E039 Hypothyroidism, unspecified: Secondary | ICD-10-CM | POA: Diagnosis not present

## 2024-09-29 DIAGNOSIS — C541 Malignant neoplasm of endometrium: Secondary | ICD-10-CM | POA: Insufficient documentation

## 2024-09-29 DIAGNOSIS — Z7985 Long-term (current) use of injectable non-insulin antidiabetic drugs: Secondary | ICD-10-CM | POA: Diagnosis not present

## 2024-09-29 DIAGNOSIS — Z9221 Personal history of antineoplastic chemotherapy: Secondary | ICD-10-CM | POA: Diagnosis not present

## 2024-09-29 DIAGNOSIS — Z8041 Family history of malignant neoplasm of ovary: Secondary | ICD-10-CM | POA: Insufficient documentation

## 2024-09-29 DIAGNOSIS — Z8542 Personal history of malignant neoplasm of other parts of uterus: Secondary | ICD-10-CM | POA: Insufficient documentation

## 2024-09-29 DIAGNOSIS — Z7951 Long term (current) use of inhaled steroids: Secondary | ICD-10-CM | POA: Diagnosis not present

## 2024-09-29 DIAGNOSIS — C538 Malignant neoplasm of overlapping sites of cervix uteri: Secondary | ICD-10-CM | POA: Insufficient documentation

## 2024-09-29 DIAGNOSIS — I110 Hypertensive heart disease with heart failure: Secondary | ICD-10-CM | POA: Diagnosis not present

## 2024-09-29 DIAGNOSIS — Z801 Family history of malignant neoplasm of trachea, bronchus and lung: Secondary | ICD-10-CM | POA: Insufficient documentation

## 2024-09-29 DIAGNOSIS — C539 Malignant neoplasm of cervix uteri, unspecified: Secondary | ICD-10-CM

## 2024-09-29 DIAGNOSIS — Z79890 Hormone replacement therapy (postmenopausal): Secondary | ICD-10-CM | POA: Diagnosis not present

## 2024-09-29 DIAGNOSIS — Z01411 Encounter for gynecological examination (general) (routine) with abnormal findings: Secondary | ICD-10-CM | POA: Insufficient documentation

## 2024-09-29 DIAGNOSIS — J45909 Unspecified asthma, uncomplicated: Secondary | ICD-10-CM | POA: Insufficient documentation

## 2024-09-29 DIAGNOSIS — Z6841 Body Mass Index (BMI) 40.0 and over, adult: Secondary | ICD-10-CM | POA: Diagnosis not present

## 2024-09-29 DIAGNOSIS — Z9079 Acquired absence of other genital organ(s): Secondary | ICD-10-CM | POA: Insufficient documentation

## 2024-09-29 DIAGNOSIS — Z8541 Personal history of malignant neoplasm of cervix uteri: Secondary | ICD-10-CM | POA: Insufficient documentation

## 2024-09-29 DIAGNOSIS — E1165 Type 2 diabetes mellitus with hyperglycemia: Secondary | ICD-10-CM | POA: Diagnosis not present

## 2024-09-29 DIAGNOSIS — I4892 Unspecified atrial flutter: Secondary | ICD-10-CM | POA: Diagnosis not present

## 2024-09-29 NOTE — Progress Notes (Signed)
 GYNECOLOGIC ONCOLOGY NEW PATIENT CONSULTATION  Date of Service: 09/29/2024 Referring Provider: Izetta Idell Caleen Corlis, NP   ASSESSMENT AND PLAN: Debra Ware is a 58 y.o. woman with a history of metastatic cervical cancer treated with carboplatin and paclitaxel from 2020 to 07/2020 (switched to paclitaxel only 12/2019 due to carbo reaction) followed by pembrolizumab until 08/2022. NED since that time. 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 Presents today to establish care as TOC.  Cervical cancer: Reviewed patient's treatment course to date in detail. Patient has been clinically NED since completion of her immunotherapy in September 2023. Reviewed that we will try to obtain a PET scan to ensure that patient continues to be NED given that at initial presentation she had stage IV disease. Her exam is limited by her morbid obesity (BMI 50).  Pap smear collected today.  Will follow-up results.  Continue annual Pap. Signs and symptom of recurrence reviewed. Plan for follow-up surveillance visit in 3 months and if continues to be NED will transition to every 6 month follow-up. Plan to follow-up for at least 5 years since completion of her pembrolizumab treatment.  Endometrial cancer: - NED on exam today. - Signs/symptoms of recurrence reviewed. - Surveillance driven by cervical cancer as above.  RTC in 31mo unless indicated sooner by PET or pap  A copy of this note was sent to the patient's referring provider.  Hoy Masters, MD Gynecologic Oncology   Medical Decision Making I personally spent  TOTAL 60 minutes face-to-face and non-face-to-face in the care of this patient, which includes all pre, intra, and post visit time on the date of service.   ------------  CC: H/o cervical cancer, endometrial cancer, TOC  HISTORY OF PRESENT ILLNESS:  Debra Ware is a 58 y.o. woman who is seen in  consultation at the request of Jaylan Duggar, NP for transfer of care for history of cervical cancer and endometrial cancer.  Patient was seen by her provider on 09/02/2024 for an annual exam.  At that time she noted that her gynecologic oncologist at [facility] no longer excepted her insurance and request a referral for continued care.  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  Patient's treatment course is as follows: - Initial diagnosis on cervical biopsy 06/2019 - Treated at city of [facility] in [place] with carboplatin/paclitaxel until 12/2019 (no bev due to new diagnosis of a fib), then single agent paclitaxel until 07/2020 (due to carbo reaction) - Initiated pembrolizumab 08/2020 and completed 2 years of therapy 08/2022 - CT on 09/06/22 NED - Establish care with Dr. Eloy at [facility] on 11/13/2022 (last seen at city [facility] prior to this visit) - 12/25/2022 PET NED but with nonspecific hypermetabolic activity in endometrial cavity. 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 Eloy at [facility] on 06/04/2023 at which time patient was recommended to continue surveillance every 3 months through August 2025 then every 6 months.  Molecular tumor testing: PD-L1+, CPS > 20 [facility] (06/2019): MSS, TMB 0 muts/mb >> ARID1A, CTNNB1, PIK3CB, PTEN, MLL2  Today patient reports that she has overall been stable since her last visit with Dr. Eloy.  Reports that she alternates between diarrhea and constipation and this  is overall been this way since her treatment.  Also notes lower back pain which is not new and stable since her diagnosis.  Otherwise denies any new new vaginal bleeding, abdominal/pelvic pain, change in bladder habits, bloating,  nausea/vomiting.    Was started on Mounjaro about 3 months ago and has seen a 20 pound weight loss.   TREATMENT HISTORY: Oncology History Overview Note  Molecular tumor testing: PD-L1+, CPS > 20 [facility] (06/2019): MSS, TMB 0 muts/mb >> ARID1A, CTNNB1, PIK3CB, PTEN, MLL2   Cancer of endocervical canal (HCC)  06/02/2019 Imaging   Ct abdomen and pelvis Malignant findings with anindistinct mass at the lower uterine segment/cervix, pelvic adenopathy and innumerable pulmonary nodules in the lower lungs   06/05/2019 Initial Diagnosis   Cancer of endocervical canal (HCC)  Endocervical biopsy: Poorly differentiated neoplasm.  High-grade carcinoma with squamous features.  The immunohistochemistry demonstrated CK 5 6 positivity, P 16+ patchy.  P63 weak patchy positive.  P53 wild-type staining.  CK7 and ER negative. HPV negative   06/05/2019 Cancer Staging   Staging form: Cervix Uteri, AJCC 8th Edition - Clinical stage from 06/05/2019: FIGO Stage IVB (cT1b1, cN1, cM1) - Signed by Eldonna Mays, MD on 09/29/2024 Histopathologic type: Squamous cell carcinoma, NOS Stage prefix: Initial diagnosis Histologic grade (G): G3 Histologic grading system: 3 grade system   07/2019 - 12/2019 Chemotherapy   Treated at city of [facility] in [place] with carboplatin/paclitaxel until 12/2019 (no bev due to new diagnosis of a fib)   12/2019 - 07/2020 Chemotherapy   Single agent paclitaxel until 07/2020 (Carbo dropped due to carbo reaction)   08/2020 - 08/2022 Chemotherapy   Initiated pembrolizumab 08/2020 and completed 2 years of therapy 08/2022   09/06/2022 Imaging   CT NED   12/25/2022 Imaging   Transitioned care from [facility] [place]. PET NED but with nonspecific hypermetabolic activity in endometrial cavity.   02/21/2023 Surgery   Robotic hysterectomy/BSO 02/21/2023 Stage IA1 grade 1 endometrioid endometrial cancer (no MI, no LVSI, p53wt)    Endometrial cancer (HCC)  01/08/2023 Initial Diagnosis    Endometrial cancer (HCC) 12/25/22: PET for cervical cancer NED but with nonspecific hypermetabolic activity in endometrial cavity. - 01/08/2023: endometrial biopsy and ECC with FIGO grade 1 endometrioid endometrial cancer on endometrial biopsy   02/21/2023 Surgery   Robotic hysterectomy/BSO 02/21/2023 Stage IA1 grade 1 endometrioid endometrial cancer (no MI, no LVSI, p53wt)      PAST MEDICAL HISTORY: Past Medical History:  Diagnosis Date   Asthma    Atrial flutter (HCC)    Cancer of endocervical canal (HCC) 06/16/2019   Chronic diastolic CHF (congestive heart failure) (HCC)    Hypertension    Hypothyroidism (acquired)    Metastatic cancer (HCC)    Mild dilation of ascending aorta    Morbid obesity with BMI of 40.0-44.9, adult (HCC) 06/16/2019   PAF (paroxysmal atrial fibrillation) (HCC) 08/16/2019   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    PAST SURGICAL HISTORY: Past Surgical History:  Procedure Laterality Date   BIOPSY THYROID      HYSTERECTOMY,TOTAL,BILAT SALPINGO-OOPHORECTOMY, ROBOT, LAP  2023   LUNG BIOPSY     PORTA CATH INSERTION      OB/GYN HISTORY: OB History  Gravida Para Term Preterm AB Living  0 0 0 0 0 0  SAB IAB Ectopic Multiple Live Births  0 0 0  0 0      Age at menarche: 73 Age at menopause: 89 Hx of HRT: no Hx of STI: no Last pap: Unsure History of abnormal pap smears: yes  SCREENING STUDIES:  Last mammogram: 07/2023 Last colonoscopy: 2022, repeat in 10 years  MEDICATIONS:  Current Outpatient Medications:    acetaminophen  (TYLENOL ) 650 MG CR tablet, Take 650 mg by mouth daily as needed for pain., Disp: , Rfl:    albuterol  (VENTOLIN  HFA) 108 (90 Base) MCG/ACT inhaler, Inhale 1-2 puffs into the lungs every 6 (six) hours as needed for wheezing or shortness of breath., Disp: 6.7 g, Rfl:  0   Cholecalciferol (VITAMIN D3) 125 MCG (5000 UT) CAPS, Take 10,000 Units by mouth daily., Disp: , Rfl:    fexofenadine (ALLEGRA) 180 MG tablet, Take 180 mg by mouth daily., Disp: , Rfl:    fluticasone -salmeterol (WIXELA INHUB) 250-50 MCG/ACT AEPB, Inhale 1 puff into the lungs in the morning and at bedtime., Disp: 1 each, Rfl: 11   furosemide  (LASIX ) 20 MG tablet, Take 1 tablet (20 mg total) by mouth daily. Take 2 tablets daily for 2 days. Then use as needed for lower leg edema., Disp: 90 tablet, Rfl: 3   levothyroxine (SYNTHROID) 150 MCG tablet, Take 150 mcg by mouth daily., Disp: , Rfl:    metoprolol  succinate (TOPROL -XL) 50 MG 24 hr tablet, TAKE 1 TABLET BY MOUTH EVERY DAY, Disp: 90 tablet, Rfl: 3   MOUNJARO 5 MG/0.5ML Pen, , Disp: , Rfl:   ALLERGIES: Allergies  Allergen Reactions   Walnut Itching   Azithromycin Hives   Cantaloupe Extract Allergy Skin Test Itching    Cantaloupe   Penicillins Rash    Did it involve swelling of the face/tongue/throat, SOB, or low BP? Yes Did it involve sudden or severe rash/hives, skin peeling, or any reaction on the inside of your mouth or nose? No Did you need to seek medical attention at a hospital or doctor's office? No When did it last happen?   unknown    If all above answers are "NO", may proceed with cephalosporin use.;   Sulfa Antibiotics Rash    FAMILY HISTORY: Family History  Problem Relation Age of Onset   Heart attack Father    Lung cancer Father    Throat cancer Father    Heart disease Father    Asthma Sister    Diabetes Paternal Grandfather    Heart disease Paternal Grandfather    Hypertension Paternal Grandfather    Ovarian cancer Maternal Aunt    Ovarian cancer Maternal Aunt    Breast cancer Neg Hx    Colon cancer Neg Hx    Prostate cancer Neg Hx    Pancreatic cancer Neg Hx    Endometrial cancer Neg Hx     SOCIAL HISTORY: Social History   Socioeconomic History   Marital status: Single    Spouse name: Not on file    Number of children: Not on file   Years of education: Not on file   Highest education level: Not on file  Occupational History   Not on file  Tobacco Use   Smoking status: Never    Passive exposure: Past   Smokeless tobacco: Never  Vaping Use   Vaping status: Never Used  Substance and Sexual Activity   Alcohol use: Not Currently   Drug use: Never   Sexual activity: Not Currently  Other Topics Concern   Not on file  Social History Narrative   Not on file  Social Drivers of Corporate Investment Banker Strain: Not on file  Food Insecurity: Food Insecurity Present (09/29/2024)   Hunger Vital Sign    Worried About Running Out of Food in the Last Year: Often true    Ran Out of Food in the Last Year: Often true  Transportation Needs: Unmet Transportation Needs (09/29/2024)   PRAPARE - Administrator, Civil Service (Medical): No    Lack of Transportation (Non-Medical): Yes  Physical Activity: Not on file  Stress: Not on file  Social Connections: Unknown (04/18/2022)   Received from [facility]   Social Network    Social Network: Not on file  Intimate Partner Violence: Not At Risk (09/29/2024)   Humiliation, Afraid, Rape, and Kick questionnaire    Fear of Current or Ex-Partner: No    Emotionally Abused: No    Physically Abused: No    Sexually Abused: No    REVIEW OF SYSTEMS: New patient intake form was reviewed.  Complete 10-system review is negative except for the following: None  PHYSICAL EXAM: BP 136/76 Comment: manual recheck  Pulse 74   Temp 98.9 F (37.2 C) (Oral)   Resp 20   Ht 5' 10 (1.778 m)   Wt (!) 346 lb 6.4 oz (157.1 kg)   SpO2 99%   BMI 49.70 kg/m  Constitutional: No acute distress. Neuro/Psych: Alert, oriented.  Head and Neck: Normocephalic, atraumatic. Neck symmetric without masses. Sclera anicteric.  Respiratory: Normal work of breathing. Clear to auscultation bilaterally. Cardiovascular: Regular rate and rhythm, no murmurs, rubs,  or gallops. Abdomen: Normoactive bowel sounds. Soft, non-distended, non-tender to palpation. No masses appreciated.  Well-healed laparoscopic incisions Extremities: Grossly normal range of motion. Warm, well perfused. No edema bilaterally. Skin: No rashes or lesions. Lymphatic: No cervical, supraclavicular, or inguinal adenopathy. Genitourinary: External genitalia without lesions. Urethral meatus without lesions or prolapse. On speculum exam, vagina without lesions.  Pap smear collected.  Bimanual exam reveals smooth vaginal cuff, no nodularity or pelvic mass.  Exam limited by body habitus. Exam chaperoned by Eleanor Epps, NP   LABORATORY AND RADIOLOGIC DATA: Outside medical records were reviewed to synthesize the above history, along with the history and physical obtained during the visit.  Outside laboratory, pathology, and imaging reports were reviewed, with pertinent results below.  I personally reviewed the outside images.  WBC  Date Value Ref Range Status  03/22/2021 10.0 4.0 - 10.5 K/uL Final   Hemoglobin  Date Value Ref Range Status  03/22/2021 13.1 12.0 - 15.0 g/dL Final  90/82/7978 86.6 11.1 - 15.9 g/dL Final   HCT  Date Value Ref Range Status  03/22/2021 40.7 36.0 - 46.0 % Final   Hematocrit  Date Value Ref Range Status  08/20/2020 39.3 34.0 - 46.6 % Final   Platelets  Date Value Ref Range Status  03/22/2021 152 150 - 400 K/uL Final  08/20/2020 179 150 - 450 x10E3/uL Final   Magnesium   Date Value Ref Range Status  01/27/2022 1.9 1.6 - 2.3 mg/dL Final   Creatinine, Ser  Date Value Ref Range Status  01/27/2022 1.07 (H) 0.57 - 1.00 mg/dL Final   AST  Date Value Ref Range Status  03/22/2021 23 15 - 41 U/L Final   ALT  Date Value Ref Range Status  03/22/2021 20 0 - 44 U/L Final    PET ([facility]) (12/08/22): Procedure: FDG PET SKULLBASE TO MIDTHIGH SUBSEQUENT   Indication: 58 years Female Cervical cancer, recurrence or metastasis  suspected, C53.8 Malignant  neoplasm of overlapping sites of cervix uteri  (CMS-HCC).   Radiopharmaceutical: 14.3 mCi F18-FDG, intravenously.   Technique: PET imaging was performed from the skull base to the mid-thigh  following intravenous administration of radiotracer. A single breath-hold  CT scan was performed at quiet end-expiration for anatomic localization and  attenuation correction.   Serum glucose: 94 mg/dL  Uptake time: 65 min  IV Contrast: Isovue 300   Comparison studies: CT dated 06/22/2020, 06/27/2019   FINDINGS:   Visualized Head & Neck:  - Lymph Nodes: No cervical lymphadenopathy. No suspicious tracer uptake.  - Thyroid : Unremarkable.  - Other: No suspicious CT findings or tracer uptake.   Chest:  - Lymph Nodes: No axillary, mediastinal, or hilar lymphadenopathy or  suspicious tracer uptake.  Calcified subcarinal node.  - Thoracic Vasculature: No suspicious CT findings or tracer uptake. Left  chest wall port with distal tip terminating near the superior cavoatrial  junction.  - Heart & Pericardium: No suspicious tracer uptake. No pericardial  effusion.  - Lungs & Pleura: No suspicious tracer uptake or suspicious pulmonary  nodules within the limitation of motion artifact..   Abdomen & Pelvis:  - Lymph Nodes: No intra-abdominal, pelvic, or inguinal  lymphadenopathy. No  suspicious tracer uptake.  - Liver:  No focal hypermetabolic activity above background liver. No  suspicious liver lesions.  - Biliary & Gallbladder: Unremarkable.  - Spleen: Normal size and uptake.  - Pancreas: No suspicious tracer uptake. No focal lesions.  - Adrenal Glands: Unremarkable.  - Kidneys: No suspicious renal masses. No hydronephrosis.  - Genitourinary: Hypermetabolic activity in the region of the endometrial  cavity, nonspecific (3:246). No evidence of cervical lesion.  - Abdominal & Pelvic Vasculature: Normal in caliber.  - Gastrointestinal: Normal in caliber. No suspicious tracer uptake.  - Peritoneum,  Mesentery, & Retroperitoneum: No suspicious tracer uptake.   Musculoskeletal:  - Bones:  No suspicious osseous lesions. S-shaped scoliosis of the  thoracolumbar spine.  - Soft Tissues: Fat-containing umbilical hernia.    IMPRESSION:   1. No evidence of recurrent cervical mass.  2.  No evidence of hypermetabolic regional or distant metastatic disease.  3.  Nonspecific hypermetabolic activity in the endometrial cavity, likely  blood products, although underlying mass not excluded. Recent prior outside  CT is not available for comparison. Recommend direct visualization.

## 2024-09-29 NOTE — Progress Notes (Signed)
 Referral for transportation sent to [employer] via Teams message.

## 2024-09-29 NOTE — Patient Instructions (Signed)
 It was a pleasure to see you in clinic today. - Normal exam today - Pap smear collected. We will follow-up results. - PET scan ordered - Return visit planned for 20mo. If doing well and normal PET/pap, will likely be able to space out to 15mo follow-up after your next visit.  Thank you very much for allowing me to provide care for you today.  I appreciate your confidence in choosing our Gynecologic Oncology team at [facility].  If you have any questions about your visit today please call our office or send us  a MyChart message and we will get back to you as soon as possible.

## 2024-09-30 ENCOUNTER — Telehealth: Payer: Self-pay | Admitting: *Deleted

## 2024-09-30 ENCOUNTER — Other Ambulatory Visit: Payer: Self-pay | Admitting: Gynecologic Oncology

## 2024-09-30 ENCOUNTER — Inpatient Hospital Stay: Admitting: Licensed Clinical Social Worker

## 2024-09-30 DIAGNOSIS — C538 Malignant neoplasm of overlapping sites of cervix uteri: Secondary | ICD-10-CM

## 2024-09-30 NOTE — Telephone Encounter (Signed)
 Per Eleanor Epps APP patient scheduled for CT C/A/P at [facility] on 11/5 at 1 pm.  Patient given appt date/time and instructions

## 2024-09-30 NOTE — Progress Notes (Signed)
 [facility] Clinical Social Work  Initial Assessment   Debra Ware is a 58 y.o. year old female contacted by phone. Clinical Social Work was referred by medical provider for need for community resources and SDOH needs.   SDOH (Social Determinants of Health) assessments performed: Yes SDOH Interventions    Flowsheet Row Clinical Support from 09/30/2024 in [facility] - A Dept Of Ger[facility]. [facility] Office Visit from 09/29/2024 in [facility] - A Dept Of [place]. [facility]  SDOH Interventions    Food Insecurity Interventions Community Resources Provided AMB Referral  Housing Interventions Community Resources Provided AMB Referral  Transportation Interventions Community Resources Provided, SCAT (Specialized Community Area Transporation) AMB Referral    SDOH Screenings   Food Insecurity: Food Insecurity Present (09/29/2024)  Housing: High Risk (09/29/2024)  Transportation Needs: Unmet Transportation Needs (09/29/2024)  Utilities: Not At Risk (09/29/2024)  Depression (PHQ2-9): Low Risk  (09/29/2024)  Social Connections: Unknown (04/18/2022)   Received from [facility]  Tobacco Use: Low Risk  (09/29/2024)    PHQ 2/9:    09/29/2024   11:11 AM  Depression screen PHQ 2/9  Decreased Interest 0  Down, Depressed, Hopeless 0  PHQ - 2 Score 0     Distress Screen completed: No     No data to display            Family/Social Information:  Housing Arrangement: patient lives in a rental home. She has a housemate who is in her 58s Transportation concerns: yes, car broke down in June.   Employment: Legally disabled.  Income source: Secretary/administrator concerns: Yes, current concerns Type of concern: Rent/ mortgage, Transportation, Medical bills, and Food Food access concerns: yes, due to costs and fixed income. No SNAP benefits Religious or spiritual practice: Not known Advanced directives: No Services Currently in  place:  [employer]. Per pt, also Medicaid, but not on file  Coping/ Adjustment to diagnosis: Patient understands treatment plan and what happens next? yes, she is on surveillance Patient reported stressors: Actuary, Transportation, and Food Current coping skills/ strengths: Capable of independent living  and Other: motivated to connect to resources; access to internet    SUMMARY: Current SDOH Barriers:  Financial constraints related to fixed income and Transportation  Clinical Social Work Clinical Goal(s):  Explore community resource options for unmet needs related to:  Corporate Treasurer , Geophysicist/field Seismologist , and Transportation  Interventions: Informed patient of the support team roles and support services at [facility] Provided CSW contact information and encouraged patient to call with any questions or concerns Referred patient to community resources: [employer] Further community support and nutrition program delivery program Provided information on other local food pantries, Access [place], programs through DSS Encouraged pt to contact billing to get Medicaid on file and to call back CT to reschedule with both insurances on file   Follow Up Plan: Patient will work on [employer] application. Patient will contact CSW with further support needs Patient verbalizes understanding of plan: Yes    Debra Augusta E Argelia Formisano, LCSW Clinical Social Worker [facility]

## 2024-10-03 LAB — CYTOLOGY - PAP
Comment: NEGATIVE
Diagnosis: NEGATIVE
High risk HPV: NEGATIVE

## 2024-10-06 ENCOUNTER — Ambulatory Visit (HOSPITAL_COMMUNITY): Admission: RE | Admit: 2024-10-06 | Source: Ambulatory Visit

## 2024-10-07 ENCOUNTER — Ambulatory Visit: Payer: Self-pay | Admitting: Psychiatry

## 2024-10-08 ENCOUNTER — Ambulatory Visit (HOSPITAL_COMMUNITY)

## 2024-10-13 ENCOUNTER — Other Ambulatory Visit: Payer: Self-pay | Admitting: Nurse Practitioner

## 2024-10-13 DIAGNOSIS — E041 Nontoxic single thyroid nodule: Secondary | ICD-10-CM

## 2024-10-21 ENCOUNTER — Telehealth: Payer: Self-pay | Admitting: Oncology

## 2024-10-21 ENCOUNTER — Encounter: Payer: Self-pay | Admitting: Oncology

## 2024-10-21 NOTE — Telephone Encounter (Signed)
 Called Debra Ware and asked if she had medicaid coverage because it may effect how much she will need to pay for her scans.  She is not sure but is willing to call to find out. Sent a MyChart message with the contact information for [place] Medicaid.

## 2024-10-22 ENCOUNTER — Ambulatory Visit
Admission: RE | Admit: 2024-10-22 | Discharge: 2024-10-22 | Disposition: A | Discharge status: Home / Self Care | Source: Ambulatory Visit | Attending: Nurse Practitioner | Admitting: Nurse Practitioner

## 2024-10-22 DIAGNOSIS — E041 Nontoxic single thyroid nodule: Secondary | ICD-10-CM

## 2024-10-22 DIAGNOSIS — E042 Nontoxic multinodular goiter: Secondary | ICD-10-CM | POA: Diagnosis not present

## 2024-10-28 ENCOUNTER — Telehealth: Payer: Self-pay | Admitting: Oncology

## 2024-10-28 NOTE — Telephone Encounter (Signed)
 Called Debra Ware regarding the CT scan.  She does not have medicaid but does have Extra Help through social security. It would not help with the CT cost.    Asked if she would like to try to have it done at another facility that may be less expensive.  She doesn't think another facility would be any less expensive.  Also discussed that she could work out a billing schedule for the $300.00 with [facility]. She would not have to pay it all up front. She said she wants to wait until after the first of the year. She is already a month behind on bills and wants to catch up.

## 2024-12-17 NOTE — Progress Notes (Signed)
 Michaele Amundson                                          MRN: [id number]   12/17/2024   The [facility] Quality Team Specialist reviewed this patient medical record for the purposes of chart review for care gap closure. The following were reviewed: abstraction for care gap closure-glycemic status assessment.    [facility] Quality Team

## 2025-01-05 ENCOUNTER — Inpatient Hospital Stay: Payer: Self-pay | Admitting: Psychiatry

## 2025-01-05 DIAGNOSIS — C538 Malignant neoplasm of overlapping sites of cervix uteri: Secondary | ICD-10-CM

## 2025-01-05 DIAGNOSIS — C541 Malignant neoplasm of endometrium: Secondary | ICD-10-CM

## 2025-01-06 NOTE — Progress Notes (Signed)
 This encounter was created in error - please disregard.

## 2025-01-07 ENCOUNTER — Telehealth: Payer: Self-pay | Admitting: *Deleted

## 2025-01-07 NOTE — Telephone Encounter (Signed)
 Patient rescheduled from 2/2 to 2/23 due to weather, patient aware

## 2025-01-26 ENCOUNTER — Inpatient Hospital Stay: Payer: Self-pay | Admitting: Psychiatry
